# Patient Record
Sex: Female | Born: 1955
Health system: Southern US, Community
[De-identification: ages and names within clinical notes are randomized; demographics above are authoritative.]

## PROBLEM LIST (undated history)

## (undated) DIAGNOSIS — R011 Cardiac murmur, unspecified: Secondary | ICD-10-CM

## (undated) DIAGNOSIS — F32A Depression, unspecified: Secondary | ICD-10-CM

## (undated) DIAGNOSIS — T7840XA Allergy, unspecified, initial encounter: Secondary | ICD-10-CM

## (undated) DIAGNOSIS — M5030 Other cervical disc degeneration, unspecified cervical region: Secondary | ICD-10-CM

## (undated) DIAGNOSIS — M797 Fibromyalgia: Secondary | ICD-10-CM

## (undated) DIAGNOSIS — M51369 Other intervertebral disc degeneration, lumbar region without mention of lumbar back pain or lower extremity pain: Secondary | ICD-10-CM

## (undated) DIAGNOSIS — B029 Zoster without complications: Secondary | ICD-10-CM

## (undated) DIAGNOSIS — M5136 Other intervertebral disc degeneration, lumbar region: Secondary | ICD-10-CM

## (undated) DIAGNOSIS — Z8709 Personal history of other diseases of the respiratory system: Secondary | ICD-10-CM

## (undated) DIAGNOSIS — K648 Other hemorrhoids: Secondary | ICD-10-CM

## (undated) DIAGNOSIS — I2510 Atherosclerotic heart disease of native coronary artery without angina pectoris: Secondary | ICD-10-CM

## (undated) DIAGNOSIS — F329 Major depressive disorder, single episode, unspecified: Secondary | ICD-10-CM

## (undated) DIAGNOSIS — K219 Gastro-esophageal reflux disease without esophagitis: Secondary | ICD-10-CM

## (undated) DIAGNOSIS — E785 Hyperlipidemia, unspecified: Secondary | ICD-10-CM

## (undated) DIAGNOSIS — I341 Nonrheumatic mitral (valve) prolapse: Secondary | ICD-10-CM

## (undated) DIAGNOSIS — G4700 Insomnia, unspecified: Secondary | ICD-10-CM

## (undated) DIAGNOSIS — J302 Other seasonal allergic rhinitis: Secondary | ICD-10-CM

## (undated) DIAGNOSIS — D649 Anemia, unspecified: Secondary | ICD-10-CM

## (undated) DIAGNOSIS — G43909 Migraine, unspecified, not intractable, without status migrainosus: Secondary | ICD-10-CM

## (undated) DIAGNOSIS — E039 Hypothyroidism, unspecified: Secondary | ICD-10-CM

## (undated) DIAGNOSIS — Z86010 Personal history of colonic polyps: Secondary | ICD-10-CM

## (undated) DIAGNOSIS — K644 Residual hemorrhoidal skin tags: Secondary | ICD-10-CM

## (undated) DIAGNOSIS — I10 Essential (primary) hypertension: Secondary | ICD-10-CM

## (undated) DIAGNOSIS — M8580 Other specified disorders of bone density and structure, unspecified site: Secondary | ICD-10-CM

## (undated) HISTORY — DX: Anemia, unspecified: D64.9

## (undated) HISTORY — DX: Depression, unspecified: F32.A

## (undated) HISTORY — DX: Insomnia, unspecified: G47.00

## (undated) HISTORY — DX: Hyperlipidemia, unspecified: E78.5

## (undated) HISTORY — DX: Other specified disorders of bone density and structure, unspecified site: M85.80

## (undated) HISTORY — DX: Other intervertebral disc degeneration, lumbar region without mention of lumbar back pain or lower extremity pain: M51.369

## (undated) HISTORY — DX: Other intervertebral disc degeneration, lumbar region: M51.36

## (undated) HISTORY — DX: Personal history of other diseases of the respiratory system: Z87.09

## (undated) HISTORY — DX: Migraine, unspecified, not intractable, without status migrainosus: G43.909

## (undated) HISTORY — DX: Fibromyalgia: M79.7

## (undated) HISTORY — DX: Atherosclerotic heart disease of native coronary artery without angina pectoris: I25.10

## (undated) HISTORY — DX: Residual hemorrhoidal skin tags: K64.4

## (undated) HISTORY — DX: Nonrheumatic mitral (valve) prolapse: I34.1

## (undated) HISTORY — PX: TONSILLECTOMY: SUR1361

## (undated) HISTORY — DX: Zoster without complications: B02.9

## (undated) HISTORY — PX: POLYPECTOMY: SHX149

## (undated) HISTORY — DX: Allergy, unspecified, initial encounter: T78.40XA

## (undated) HISTORY — DX: Other cervical disc degeneration, unspecified cervical region: M50.30

## (undated) HISTORY — DX: Cardiac murmur, unspecified: R01.1

## (undated) HISTORY — DX: Other seasonal allergic rhinitis: J30.2

## (undated) HISTORY — DX: Personal history of colonic polyps: Z86.010

## (undated) HISTORY — DX: Hypothyroidism, unspecified: E03.9

## (undated) HISTORY — DX: Major depressive disorder, single episode, unspecified: F32.9

## (undated) HISTORY — PX: BUNIONECTOMY: SHX129

## (undated) HISTORY — DX: Residual hemorrhoidal skin tags: K64.8

## (undated) HISTORY — DX: Gastro-esophageal reflux disease without esophagitis: K21.9

---

## 1994-06-22 HISTORY — PX: SPLENECTOMY, TOTAL: SHX788

## 1994-06-22 HISTORY — PX: DEEP NECK LYMPH NODE BIOPSY / EXCISION: SUR126

## 1998-05-28 ENCOUNTER — Other Ambulatory Visit: Admission: RE | Admit: 1998-05-28 | Discharge: 1998-05-28 | Payer: Self-pay | Admitting: Obstetrics & Gynecology

## 1999-09-30 ENCOUNTER — Other Ambulatory Visit: Admission: RE | Admit: 1999-09-30 | Discharge: 1999-09-30 | Payer: Self-pay | Admitting: Obstetrics & Gynecology

## 2000-08-27 ENCOUNTER — Ambulatory Visit (HOSPITAL_COMMUNITY): Admission: RE | Admit: 2000-08-27 | Discharge: 2000-08-27 | Payer: Self-pay | Admitting: Internal Medicine

## 2000-12-08 ENCOUNTER — Other Ambulatory Visit: Admission: RE | Admit: 2000-12-08 | Discharge: 2000-12-08 | Payer: Self-pay | Admitting: Obstetrics & Gynecology

## 2001-03-22 ENCOUNTER — Encounter (INDEPENDENT_AMBULATORY_CARE_PROVIDER_SITE_OTHER): Payer: Self-pay | Admitting: *Deleted

## 2001-03-22 ENCOUNTER — Ambulatory Visit (HOSPITAL_BASED_OUTPATIENT_CLINIC_OR_DEPARTMENT_OTHER): Admission: RE | Admit: 2001-03-22 | Discharge: 2001-03-22 | Payer: Self-pay | Admitting: Plastic Surgery

## 2002-01-02 ENCOUNTER — Other Ambulatory Visit: Admission: RE | Admit: 2002-01-02 | Discharge: 2002-01-02 | Payer: Self-pay | Admitting: Obstetrics & Gynecology

## 2003-03-13 ENCOUNTER — Other Ambulatory Visit: Admission: RE | Admit: 2003-03-13 | Discharge: 2003-03-13 | Payer: Self-pay | Admitting: Obstetrics & Gynecology

## 2004-03-22 DIAGNOSIS — M797 Fibromyalgia: Secondary | ICD-10-CM

## 2004-03-22 HISTORY — DX: Fibromyalgia: M79.7

## 2004-05-09 ENCOUNTER — Other Ambulatory Visit: Admission: RE | Admit: 2004-05-09 | Discharge: 2004-05-09 | Payer: Self-pay | Admitting: Obstetrics & Gynecology

## 2005-03-16 ENCOUNTER — Encounter: Admission: RE | Admit: 2005-03-16 | Discharge: 2005-03-16 | Payer: Self-pay | Admitting: Neurology

## 2005-05-25 ENCOUNTER — Other Ambulatory Visit: Admission: RE | Admit: 2005-05-25 | Discharge: 2005-05-25 | Payer: Self-pay | Admitting: Obstetrics & Gynecology

## 2006-04-05 ENCOUNTER — Encounter: Admission: RE | Admit: 2006-04-05 | Discharge: 2006-04-05 | Payer: Self-pay | Admitting: Sports Medicine

## 2007-08-25 ENCOUNTER — Ambulatory Visit: Payer: Self-pay | Admitting: Internal Medicine

## 2007-09-02 ENCOUNTER — Ambulatory Visit: Payer: Self-pay | Admitting: Internal Medicine

## 2007-09-02 HISTORY — PX: COLONOSCOPY: SHX5424

## 2008-07-09 ENCOUNTER — Encounter: Admission: RE | Admit: 2008-07-09 | Discharge: 2008-07-09 | Payer: Self-pay | Admitting: Internal Medicine

## 2009-02-20 HISTORY — PX: STRABISMUS SURGERY: SHX218

## 2009-04-08 ENCOUNTER — Encounter: Payer: Self-pay | Admitting: Gastroenterology

## 2009-05-08 ENCOUNTER — Encounter: Payer: Self-pay | Admitting: Gastroenterology

## 2009-08-09 ENCOUNTER — Encounter: Payer: Self-pay | Admitting: Gastroenterology

## 2010-07-12 ENCOUNTER — Encounter: Payer: Self-pay | Admitting: Sports Medicine

## 2010-07-13 ENCOUNTER — Encounter: Payer: Self-pay | Admitting: Neurology

## 2010-07-24 NOTE — Letter (Signed)
Summary: [facility]  [facility]   Imported By: Sherian Rein 09/20/2009 08:29:20  _____________________________________________________________________  External Attachment:    Type:   Image     Comment:   External Document

## 2011-04-03 ENCOUNTER — Other Ambulatory Visit: Payer: Self-pay | Admitting: Obstetrics & Gynecology

## 2011-04-03 DIAGNOSIS — R928 Other abnormal and inconclusive findings on diagnostic imaging of breast: Secondary | ICD-10-CM

## 2011-04-21 ENCOUNTER — Ambulatory Visit
Admission: RE | Admit: 2011-04-21 | Discharge: 2011-04-21 | Disposition: A | Discharge status: Home / Self Care | Payer: BC Managed Care – PPO | Source: Ambulatory Visit | Attending: Obstetrics & Gynecology | Admitting: Obstetrics & Gynecology

## 2011-04-21 DIAGNOSIS — R928 Other abnormal and inconclusive findings on diagnostic imaging of breast: Secondary | ICD-10-CM

## 2011-04-22 ENCOUNTER — Telehealth: Payer: Self-pay | Admitting: Internal Medicine

## 2011-04-22 NOTE — Telephone Encounter (Signed)
Left message for patient to call back  

## 2011-04-22 NOTE — Telephone Encounter (Signed)
I have scheduled the patient for 05/18/11 2:00.  I have placed her on the cancellation list.  Dr Jarome Matin has told her she has IBS.  She is asked to follow up with him until the appt and she is asked to call periodically for openings.

## 2011-04-23 ENCOUNTER — Other Ambulatory Visit: Payer: Self-pay | Admitting: Obstetrics & Gynecology

## 2011-04-23 DIAGNOSIS — Z803 Family history of malignant neoplasm of breast: Secondary | ICD-10-CM

## 2011-05-20 ENCOUNTER — Ambulatory Visit: Payer: BC Managed Care – PPO | Admitting: Internal Medicine

## 2011-05-22 ENCOUNTER — Encounter: Payer: Self-pay | Admitting: Internal Medicine

## 2011-05-22 ENCOUNTER — Encounter: Payer: Self-pay | Admitting: Gastroenterology

## 2011-05-25 ENCOUNTER — Other Ambulatory Visit: Payer: BC Managed Care – PPO

## 2011-06-01 ENCOUNTER — Ambulatory Visit
Admission: RE | Admit: 2011-06-01 | Discharge: 2011-06-01 | Disposition: A | Discharge status: Home / Self Care | Payer: BC Managed Care – PPO | Source: Ambulatory Visit | Attending: Obstetrics & Gynecology | Admitting: Obstetrics & Gynecology

## 2011-06-01 DIAGNOSIS — Z803 Family history of malignant neoplasm of breast: Secondary | ICD-10-CM

## 2011-06-01 MED ORDER — GADOBENATE DIMEGLUMINE 529 MG/ML IV SOLN
14.0000 mL | Freq: Once | INTRAVENOUS | Status: AC | PRN
  Administered 2011-06-01: 14 mL via INTRAVENOUS

## 2011-06-10 ENCOUNTER — Ambulatory Visit (INDEPENDENT_AMBULATORY_CARE_PROVIDER_SITE_OTHER): Payer: BC Managed Care – PPO | Admitting: Internal Medicine

## 2011-06-10 ENCOUNTER — Encounter: Payer: Self-pay | Admitting: Internal Medicine

## 2011-06-10 DIAGNOSIS — K589 Irritable bowel syndrome without diarrhea: Secondary | ICD-10-CM | POA: Insufficient documentation

## 2011-06-10 DIAGNOSIS — R1319 Other dysphagia: Secondary | ICD-10-CM

## 2011-06-10 DIAGNOSIS — R198 Other specified symptoms and signs involving the digestive system and abdomen: Secondary | ICD-10-CM

## 2011-06-10 MED ORDER — LINACLOTIDE 290 MCG PO CAPS: 1.0000 | ORAL_CAPSULE | ORAL | Status: DC

## 2011-06-10 MED ORDER — PEG-KCL-NACL-NASULF-NA ASC-C 100 G PO SOLR: 1.0000 | Freq: Once | ORAL | Status: DC

## 2011-06-10 NOTE — Progress Notes (Signed)
Subjective:    Patient ID: Cynthia Poole, female    DOB: 1956-04-25, 55 y.o.   MRN: [id number]  HPI  This 55 year old married white woman presents for evaluation of abdominal pain and bowel habit changes. She relates that she's been constipated for many years. She attributes this to using amitriptyline for 20 years or more for chronic headaches. Unfortunately it doesn't seem to help her headaches completely, either. She began using MiraLax about a year ago for constipation. Prior to that she would move her bowels about twice a week. In general she did not have much discomfort or pain with that but over time she developed painful lower abdomen cramps. There was no clear-cut pattern to those. Increased over time. Now that she is on MiraLax she will go for a couple of days or so without defecation, than having normal stool followed by multiple more progressively looser stools that involve a lot of cramping and discomfort. The following day she feels worn out and sore. She will have some anorectal discomfort as well. He takes the MiraLax most days of the week. Stools are not necessarily more narrow. There is no bleeding reported. She clearly knows that if she has a stressful day planned at work such as a meeting with the staff or other issues she will feel worse. She denies any associated urinary symptoms, there is no urinary or fecal incontinence. She questions that she might have food allergies and contributing to headaches and cut issues. She does note she drink several glasses of red She'll get headaches and also had nausea and gastrointestinal upset.  For about 5 years she's had intermittent solid food dysphagia. She has not noticed significant heartburn problems. The pattern of intermittent intact solid food dysphagia has been stable for several years she says. There is no associated weight loss or bleeding.Marland Kitchen No Known Allergies Outpatient Prescriptions Prior to Visit  Medication Sig Dispense  Refill  . ALPRAZolam (XANAX) 0.5 MG tablet Take 0.5 mg by mouth daily as needed.        Marland Kitchen amitriptyline (ELAVIL) 25 MG tablet Take 25 mg by mouth daily.        Marland Kitchen aspirin 325 MG tablet Take 325 mg by mouth daily.        . Cholecalciferol (VITAMIN D) 2000 UNITS CAPS Take 1 capsule by mouth daily.        Marland Kitchen estradiol (VIVELLE-DOT) 0.05 MG/24HR Place 1 patch onto the skin once a week.        . levothyroxine (SYNTHROID, LEVOTHROID) 25 MCG tablet Take 1 tablet daily on an empty stomach       . gabapentin (NEURONTIN) 100 MG capsule Take 1-3 capsules by mouth every night        Past Medical History  Diagnosis Date  . MVP (mitral valve prolapse)   . Heart murmur   . History of pleurisy   . Seasonal allergies   . Vitamin D deficiency   . Osteopenia   . Internal and external bleeding hemorrhoids   . Depression   . Hypothyroidism   . HLD (hyperlipidemia)   . Migraines   . GERD (gastroesophageal reflux disease)   . Fibromyalgia 03/2004  . DDD (degenerative disc disease), cervical   . DDD (degenerative disc disease), lumbar   . Hodgkin's disease   . Insomnia   . Anemia    Past Surgical History  Procedure Date  . Splenectomy, total 1996  . Tonsillectomy 1965  . Strabismus surgery 02/2009  left  . Colonoscopy 09/02/2007    redundant colon, external hemorrhoids  . Bunionectomy   . Neck surgery 1996    right LN excision   History   Social History  . Marital Status: Married         Number of Children: 2  .     Social History Main Topics  . Smoking status: Never Smoker   . Smokeless tobacco: Never Used  . Alcohol Use: No  . Drug Use: No  .     Other Topics Concern  .  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 Heart disease Maternal Grandfather   . Heart disease Paternal Grandfather   . Heart disease      uncle  .  Aneurysm Father     AAA  . Hypertension Father   . Tuberculosis Father   . Breast cancer Maternal Grandmother   . Breast cancer Maternal Aunt   . Arthritis Mother   . Colon cancer Neg Hx   . Breast cancer Sister     x 2         Review of Systems Positive for some back pain, worsening vision, heart murmur all other systems are negative or as reflected above.    Objective:   Physical Exam General:  Well-developed, well-nourished and in no acute distress Eyes:  anicteric. ENT:   Mouth and posterior pharynx free of lesions.  Neck:   supple w/o thyromegaly or mass.  Lungs: Clear to auscultation bilaterally. Heart:  S1S2, no rubs, murmurs, gallops. Abdomen:  soft, non-tender, no hepatosplenomegaly, hernia, or mass and BS+.  Rectal: With female staff present, inspection of the anoderm shows a few skin tags. Otherwise normal. Anal way and is absent. There is    normal resting tone, scanty stool, no mass. There is a slight rectocele. Voluntary squeeze is intact though on the first attempt there    was initial slight relaxation and then squeezing of the anal sphincter. Valsalva maneuver shows appropriate descent and abdominal    contraction. Lymph:  no cervical or supraclavicular adenopathy. Extremities:   no edema Neuro:  A&O x 3.  Psych:  appropriate mood and  Affect.   Data Reviewed: 04/10/2011 labs. Hemoglobin 11.8 MCV 95. Normal comprehensive metabolic panel. B12 leveI is 956, ferritin 35, S. Pepin immunoglobulins normal October 31. TSH was normal as was free T4 October 19       Assessment & Plan:   #1change in bowel habits: She has chronic constipation and IBS issues I think. Her constipation problems sound like a slow transit issue. It may or may not be related to nortriptyline. She claims a high fiber diet though perhaps not that high. In my experience fiber does not tend to help patients like this very much. However her colonoscopy in 2009 had a fair prep overall. The  possibility of the development of a colorectal neoplastic lesion exists that was low. I have recommended and she has agreed to have a colonoscopy. Risks benefits and indications were explained she understands and agrees to proceed. In the meantime we have tried linaclotide, a new agent to treat constipation that increases fluid content in the intestine. Is also indicated for IBS associated with constipation. Samples are provided. She may still use MiraLax with this, I told her to titrate for effect. She  may end up needing some stress reduction techniques.  #2 dysphagia: This sounds like it could be peptic stricture or ring. She has had radiation for Hodgkin's disease so there could be some relationship to that though probably not it remains possible. Plan for an upper endoscopy with possible esophageal dilation. I'm not convinced she needs a PPI at this point, there is not much heartburn described. Await the EGD results. I've explained the risks benefits and indications she understands and agrees to proceed.  I appreciate the opportunity care of this patient.

## 2011-06-10 NOTE — Patient Instructions (Signed)
You have been scheduled for an Endoscopy/Colonoscopy with separate instructions given. Your prep kit has been sent to your pharmacy for you to pick up. You have been given samples of Linzess to try, take 1 by mouth every morning.

## 2011-06-11 ENCOUNTER — Encounter: Payer: Self-pay | Admitting: Internal Medicine

## 2011-06-14 ENCOUNTER — Observation Stay (HOSPITAL_COMMUNITY)
Admission: EM | Admit: 2011-06-14 | Discharge: 2011-06-15 | Disposition: A | Discharge status: Home / Self Care | Payer: BC Managed Care – PPO | Attending: Internal Medicine | Admitting: Internal Medicine

## 2011-06-14 ENCOUNTER — Encounter (HOSPITAL_COMMUNITY): Payer: Self-pay | Admitting: Emergency Medicine

## 2011-06-14 DIAGNOSIS — K5900 Constipation, unspecified: Secondary | ICD-10-CM | POA: Insufficient documentation

## 2011-06-14 DIAGNOSIS — R112 Nausea with vomiting, unspecified: Secondary | ICD-10-CM | POA: Insufficient documentation

## 2011-06-14 DIAGNOSIS — E876 Hypokalemia: Secondary | ICD-10-CM | POA: Insufficient documentation

## 2011-06-14 DIAGNOSIS — E860 Dehydration: Secondary | ICD-10-CM | POA: Insufficient documentation

## 2011-06-14 DIAGNOSIS — K5289 Other specified noninfective gastroenteritis and colitis: Principal | ICD-10-CM | POA: Insufficient documentation

## 2011-06-14 DIAGNOSIS — IMO0001 Reserved for inherently not codable concepts without codable children: Secondary | ICD-10-CM | POA: Insufficient documentation

## 2011-06-14 DIAGNOSIS — M545 Low back pain, unspecified: Secondary | ICD-10-CM | POA: Insufficient documentation

## 2011-06-14 DIAGNOSIS — Z87898 Personal history of other specified conditions: Secondary | ICD-10-CM | POA: Insufficient documentation

## 2011-06-14 DIAGNOSIS — R197 Diarrhea, unspecified: Secondary | ICD-10-CM | POA: Diagnosis present

## 2011-06-14 DIAGNOSIS — Z9081 Acquired absence of spleen: Secondary | ICD-10-CM

## 2011-06-14 DIAGNOSIS — Z9089 Acquired absence of other organs: Secondary | ICD-10-CM | POA: Insufficient documentation

## 2011-06-14 DIAGNOSIS — Z79899 Other long term (current) drug therapy: Secondary | ICD-10-CM | POA: Insufficient documentation

## 2011-06-14 DIAGNOSIS — D6489 Other specified anemias: Secondary | ICD-10-CM | POA: Insufficient documentation

## 2011-06-14 DIAGNOSIS — C8590 Non-Hodgkin lymphoma, unspecified, unspecified site: Secondary | ICD-10-CM | POA: Insufficient documentation

## 2011-06-14 DIAGNOSIS — R509 Fever, unspecified: Secondary | ICD-10-CM | POA: Diagnosis present

## 2011-06-14 DIAGNOSIS — I959 Hypotension, unspecified: Secondary | ICD-10-CM | POA: Diagnosis present

## 2011-06-14 DIAGNOSIS — R1110 Vomiting, unspecified: Secondary | ICD-10-CM

## 2011-06-14 DIAGNOSIS — I341 Nonrheumatic mitral (valve) prolapse: Secondary | ICD-10-CM | POA: Insufficient documentation

## 2011-06-14 DIAGNOSIS — E039 Hypothyroidism, unspecified: Secondary | ICD-10-CM | POA: Insufficient documentation

## 2011-06-14 DIAGNOSIS — M797 Fibromyalgia: Secondary | ICD-10-CM | POA: Insufficient documentation

## 2011-06-14 DIAGNOSIS — C859A Non-Hodgkin lymphoma, unspecified, in remission: Secondary | ICD-10-CM | POA: Insufficient documentation

## 2011-06-14 LAB — URINALYSIS, ROUTINE W REFLEX MICROSCOPIC
Glucose, UA: NEGATIVE mg/dL
Leukocytes, UA: NEGATIVE
Nitrite: NEGATIVE
Protein, ur: NEGATIVE mg/dL
Urobilinogen, UA: 0.2 mg/dL (ref 0.0–1.0)

## 2011-06-14 LAB — INFLUENZA PANEL BY PCR (TYPE A & B)
H1N1 flu by pcr: NOT DETECTED
Influenza A By PCR: NEGATIVE
Influenza B By PCR: NEGATIVE

## 2011-06-14 LAB — DIFFERENTIAL
Basophils Absolute: 0 10*3/uL (ref 0.0–0.1)
Eosinophils Absolute: 0 10*3/uL (ref 0.0–0.7)
Lymphocytes Relative: 7 % — ABNORMAL LOW (ref 12–46)
Lymphs Abs: 0.7 10*3/uL (ref 0.7–4.0)
Neutrophils Relative %: 86 % — ABNORMAL HIGH (ref 43–77)

## 2011-06-14 LAB — CBC
MCH: 30.7 pg (ref 26.0–34.0)
Platelets: 287 10*3/uL (ref 150–400)
RBC: 4.17 MIL/uL (ref 3.87–5.11)
WBC: 10.5 10*3/uL (ref 4.0–10.5)

## 2011-06-14 LAB — BASIC METABOLIC PANEL
CO2: 26 mEq/L (ref 19–32)
Calcium: 9 mg/dL (ref 8.4–10.5)
GFR calc Af Amer: >90 mL/min (ref >90)
GFR calc non Af Amer: >90 mL/min (ref >90)
Sodium: 137 mEq/L (ref 135–145)

## 2011-06-14 MED ORDER — ALUM & MAG HYDROXIDE-SIMETH 200-200-20 MG/5ML PO SUSP: 30.0000 mL | Freq: Four times a day (QID) | ORAL | Status: DC | PRN

## 2011-06-14 MED ORDER — OSELTAMIVIR PHOSPHATE 75 MG PO CAPS
75.0000 mg | ORAL_CAPSULE | Freq: Two times a day (BID) | ORAL | Status: DC
  Filled 2011-06-14 (×4): qty 1

## 2011-06-14 MED ORDER — ESTRADIOL 0.1 MG/24HR TD PTWK: 0.1000 mg | MEDICATED_PATCH | TRANSDERMAL | Status: DC

## 2011-06-14 MED ORDER — ACETAMINOPHEN 650 MG RE SUPP
650.0000 mg | Freq: Four times a day (QID) | RECTAL | Status: DC | PRN
  Administered 2011-06-14: 650 mg via RECTAL
  Filled 2011-06-14: qty 1

## 2011-06-14 MED ORDER — VITAMINS A & D EX OINT
TOPICAL_OINTMENT | CUTANEOUS | Status: AC
  Filled 2011-06-14: qty 5

## 2011-06-14 MED ORDER — HYDROMORPHONE HCL PF 1 MG/ML IJ SOLN
1.0000 mg | Freq: Once | INTRAMUSCULAR | Status: AC
  Administered 2011-06-14: 1 mg via INTRAVENOUS
  Filled 2011-06-14: qty 1

## 2011-06-14 MED ORDER — ZOLPIDEM TARTRATE 10 MG PO TABS
10.0000 mg | ORAL_TABLET | Freq: Every evening | ORAL | Status: DC | PRN
  Filled 2011-06-14: qty 1

## 2011-06-14 MED ORDER — ONDANSETRON HCL 4 MG/2ML IJ SOLN
4.0000 mg | Freq: Once | INTRAMUSCULAR | Status: AC
  Administered 2011-06-14: 4 mg via INTRAVENOUS
  Filled 2011-06-14: qty 2

## 2011-06-14 MED ORDER — AMITRIPTYLINE HCL 25 MG PO TABS
25.0000 mg | ORAL_TABLET | Freq: Every day | ORAL | Status: DC
Start: 2011-06-14 — End: 2011-06-15
  Administered 2011-06-14: 25 mg via ORAL
  Filled 2011-06-14 (×2): qty 1

## 2011-06-14 MED ORDER — LEVOTHYROXINE SODIUM 100 MCG PO TABS
100.0000 ug | ORAL_TABLET | Freq: Every day | ORAL | Status: DC
  Administered 2011-06-15: 100 ug via ORAL
  Filled 2011-06-14: qty 1

## 2011-06-14 MED ORDER — SODIUM CHLORIDE 0.9 % IV BOLUS (SEPSIS)
1000.0000 mL | Freq: Once | INTRAVENOUS | Status: AC
  Administered 2011-06-14: 1000 mL via INTRAVENOUS

## 2011-06-14 MED ORDER — ACETAMINOPHEN 325 MG PO TABS
ORAL_TABLET | ORAL | Status: AC
  Administered 2011-06-14: 650 mg
  Filled 2011-06-14: qty 2

## 2011-06-14 MED ORDER — ONDANSETRON HCL 4 MG PO TABS: 4.0000 mg | ORAL_TABLET | Freq: Three times a day (TID) | ORAL | Status: DC | PRN

## 2011-06-14 MED ORDER — ENOXAPARIN SODIUM 40 MG/0.4ML ~~LOC~~ SOLN
40.0000 mg | SUBCUTANEOUS | Status: DC
  Filled 2011-06-14 (×2): qty 0.4

## 2011-06-14 MED ORDER — ALPRAZOLAM 0.5 MG PO TABS
0.5000 mg | ORAL_TABLET | Freq: Every day | ORAL | Status: DC | PRN
  Administered 2011-06-14: 0.5 mg via ORAL
  Filled 2011-06-14: qty 1

## 2011-06-14 MED ORDER — SODIUM CHLORIDE 0.9 % IV SOLN
INTRAVENOUS | Status: DC
  Administered 2011-06-14: 13:00:00 via INTRAVENOUS

## 2011-06-14 MED ORDER — ACETAMINOPHEN 325 MG PO TABS
650.0000 mg | ORAL_TABLET | Freq: Four times a day (QID) | ORAL | Status: DC | PRN
  Filled 2011-06-14: qty 1

## 2011-06-14 MED ORDER — POTASSIUM CHLORIDE IN NACL 20-0.9 MEQ/L-% IV SOLN
INTRAVENOUS | Status: DC
  Administered 2011-06-14 – 2011-06-15 (×4): via INTRAVENOUS
  Filled 2011-06-14 (×8): qty 1000

## 2011-06-14 NOTE — ED Notes (Signed)
Pt states she believe that she ate bad food last night. She has experienced N/V/D x 25 in that past day. Patient temp 101.8 and states that she doesn't want tylenol PO at this time because she is afraid she will vomit. Bowel sounds present in all 4 quads.

## 2011-06-14 NOTE — H&P (Signed)
Steven Veazie is an 55 y.o. female.   Chief Complaint: severe nausea and diarrhea, weakness HPI:  The patient is a 55 year old woman who is well-known to me. She was in her usual state of good health until about midnight last night when she awoke with nausea and vomiting. She had multiple episodes of nausea, vomiting, and diarrhea, and today has had some fever. She was extremely lightheaded today and called 911 for ambulance transfer to the emergency room. In the emergency room she had severe hypotension and has been started on high-dose IV fluids. Currently she has sinus congestion with fever sore throat, and mild nausea after she was given an antiemetic in the emergency room. Yesterday she had chicken pot pie that was from a frozen dinner. There are several close family members with similar symptoms. She recently was treated with Zithromax for a presumed sinus infection. Of note, her history is most significant for remote Hodgkin's lymphoma which led to splenectomy. She has not had recurrence of the Hodgkin's disease.  Past Medical History  Diagnosis Date  . MVP (mitral valve prolapse)   . Heart murmur   . History of pleurisy   . Seasonal allergies   . Vitamin D deficiency   . Osteopenia   . Internal and external bleeding hemorrhoids   . Depression   . Hypothyroidism   . HLD (hyperlipidemia)   . Migraines   . GERD (gastroesophageal reflux disease)   . Fibromyalgia 03/2004  . DDD (degenerative disc disease), cervical   . DDD (degenerative disc disease), lumbar   . Hodgkin's disease   . Insomnia   . Anemia     Medications Prior to Admission  Medication Dose Route Frequency Provider Last Rate Last Dose  . 0.9 %  sodium chloride infusion   Intravenous Continuous Leroy Sea, MD      . acetaminophen (TYLENOL) 325 MG tablet        650 mg at 06/14/11 0937  . HYDROmorphone (DILAUDID) injection 1 mg  1 mg Intravenous Once Celene Kras, MD   1 mg at 06/14/11 0981  . ondansetron  (ZOFRAN) injection 4 mg  4 mg Intravenous Once Celene Kras, MD   4 mg at 06/14/11 0830  . sodium chloride 0.9 % bolus 1,000 mL  1,000 mL Intravenous Once Celene Kras, MD   1,000 mL at 06/14/11 0834  . sodium chloride 0.9 % bolus 1,000 mL  1,000 mL Intravenous Once Celene Kras, MD   1,000 mL at 06/14/11 1021  . sodium chloride 0.9 % bolus 1,000 mL  1,000 mL Intravenous Once Celene Kras, MD   1,000 mL at 06/14/11 1111   Medications Prior to Admission  Medication Sig Dispense Refill  . ALPRAZolam (XANAX) 0.5 MG tablet Take 0.5 mg by mouth daily as needed. For anxiety.      Marland Kitchen amitriptyline (ELAVIL) 25 MG tablet Take 25 mg by mouth at bedtime.       Marland Kitchen aspirin 325 MG tablet Take 325 mg by mouth daily.        . Cholecalciferol (VITAMIN D) 2000 UNITS CAPS Take 1 capsule by mouth daily.        Marland Kitchen estradiol (VIVELLE-DOT) 0.05 MG/24HR Place 1 patch onto the skin 2 (two) times a week.       . levothyroxine (SYNTHROID, LEVOTHROID) 25 MCG tablet Take 1 tablet daily on an empty stomach       . Polyethylene Glycol 3350 (MIRALAX PO) Take 1  scoop by mouth at bedtime as needed. For constipation.      . Linaclotide 290 MCG CAPS Take 1 tablet by mouth every morning.  12 capsule  0  . peg 3350 powder (MOVIPREP) 100 G SOLR Take 1 kit (100 g total) by mouth once.  1 kit  0    ADDITIONAL HOME MEDICATIONS: No additional home medications to add to her medication reconciliation last  PHYSICIANS INVOLVED IN CARE: Jarome Matin (primary care), [facility]  Past Surgical History  Procedure Date  . Splenectomy, total 1996  . Tonsillectomy 1965  . Strabismus surgery 02/2009    left  . Colonoscopy 09/02/2007    redundant colon, external hemorrhoids  . Bunionectomy   . Neck surgery 1996    right LN excision    Family History  Problem Relation Age of Onset  . Heart disease Maternal Grandfather   . Heart disease Paternal Grandfather   . Heart disease      uncle  . Aneurysm Father     AAA  . Hypertension Father     . Tuberculosis Father   . Breast cancer Maternal Grandmother   . Breast cancer Maternal Aunt   . Arthritis Mother   . Colon cancer Neg Hx   . Breast cancer Sister     x 2      Social History:  reports that she has never smoked. She has never used smokeless tobacco. She reports that she does not drink alcohol or use illicit drugs. she is married and lives with her husband in [place]. She is a nonsmoker and does not abuse alcohol.  Allergies: No Known Allergies   ROS: ankle swelling, cancer/tumor and heart murmur  PHYSICAL EXAM: Blood pressure 89/51, pulse 91, temperature 101.8 F (38.8 C), temperature source Oral, resp. rate 15, height 5\' 6"  (1.676 m), weight 67.132 kg (148 lb), SpO2 94.00%. In general, she is a well-nourished well-developed white woman who was mildly flushed while lying supine in bed. HEENT exam was significant for moderate rhinitis, neck was supple without jugular venous distention or carotid bruit. Chest was clear to auscultation, heart had a regular rate and rhythm, abdomen had normal bowel sounds and no tenderness, extremities were without cyanosis, clubbing, or edema. Neurologic exam was nonfocal.  Results for orders placed during the hospital encounter of 06/14/11 (from the past 48 hour(s))  CBC     Status: Normal   Collection Time   06/14/11  8:20 AM      Component Value Range Comment   WBC 10.5  4.0 - 10.5 (K/uL)    RBC 4.17  3.87 - 5.11 (MIL/uL)    Hemoglobin 12.8  12.0 - 15.0 (g/dL)    HCT 16.1  09.6 - 04.5 (%)    MCV 89.7  78.0 - 100.0 (fL)    MCH 30.7  26.0 - 34.0 (pg)    MCHC 34.2  30.0 - 36.0 (g/dL)    RDW 40.9  81.1 - 91.4 (%)    Platelets 287  150 - 400 (K/uL)   DIFFERENTIAL     Status: Abnormal   Collection Time   06/14/11  8:20 AM      Component Value Range Comment   Neutrophils Relative 86 (*) 43 - 77 (%)    Neutro Abs 9.0 (*) 1.7 - 7.7 (K/uL)    Lymphocytes Relative 7 (*) 12 - 46 (%)    Lymphs Abs 0.7  0.7 - 4.0 (K/uL)     Monocytes Relative 7  3 -  12 (%)    Monocytes Absolute 0.7  0.1 - 1.0 (K/uL)    Eosinophils Relative 0  0 - 5 (%)    Eosinophils Absolute 0.0  0.0 - 0.7 (K/uL)    Basophils Relative 0  0 - 1 (%)    Basophils Absolute 0.0  0.0 - 0.1 (K/uL)   BASIC METABOLIC PANEL     Status: Abnormal   Collection Time   06/14/11  8:20 AM      Component Value Range Comment   Sodium 137  135 - 145 (mEq/L)    Potassium 3.2 (*) 3.5 - 5.1 (mEq/L)    Chloride 101  96 - 112 (mEq/L)    CO2 26  19 - 32 (mEq/L)    Glucose, Bld 106 (*) 70 - 99 (mg/dL)    BUN 20  6 - 23 (mg/dL)    Creatinine, Ser 1.61  0.50 - 1.10 (mg/dL)    Calcium 9.0  8.4 - 10.5 (mg/dL)    GFR calc non Af Amer >90  >90 (mL/min)    GFR calc Af Amer >90  >90 (mL/min)   URINALYSIS, ROUTINE W REFLEX MICROSCOPIC     Status: Normal   Collection Time   06/14/11 10:21 AM      Component Value Range Comment   Color, Urine YELLOW  YELLOW     APPearance CLEAR  CLEAR     Specific Gravity, Urine 1.020  1.005 - 1.030     pH 5.5  5.0 - 8.0     Glucose, UA NEGATIVE  NEGATIVE (mg/dL)    Hgb urine dipstick NEGATIVE  NEGATIVE     Bilirubin Urine NEGATIVE  NEGATIVE     Ketones, ur NEGATIVE  NEGATIVE (mg/dL)    Protein, ur NEGATIVE  NEGATIVE (mg/dL)    Urobilinogen, UA 0.2  0.0 - 1.0 (mg/dL)    Nitrite NEGATIVE  NEGATIVE     Leukocytes, UA NEGATIVE  NEGATIVE  MICROSCOPIC NOT DONE ON URINES WITH NEGATIVE PROTEIN, BLOOD, LEUKOCYTES, NITRITE, OR GLUCOSE <1000 mg/dL.   No results found.   Assessment/Plan #1 Nausea, Vomiting, and Diarrhea: Her symptoms are most likely from a viral etiology, either her manifestation of acute influenza or nor virus infection. An acute influenza rapid test has been sent as well as stool specimens for C. difficile and enteric pathogens. I will start her on empiric treatment for influenza given the current epidemic of influenza in our community. She is status post splenectomy and is immunocompromised, so she needs admission and  observation. We will recheck a CBC tomorrow morning as well as electrolytes. #2 Hypotension: She had severe hypotension with blood pressure systolics in the 60-70 range initially but is slowly improving with IV fluids. She will get high-dose IV fluids today.  Deem Marmol G 06/14/2011, 1:11 PM

## 2011-06-14 NOTE — ED Provider Notes (Signed)
History     CSN: [id number]  Arrival date & time 06/14/11  4098   First MD Initiated Contact with Patient 06/14/11 0725      Chief Complaint  Patient presents with  . Abdominal Pain  . Nausea  . Emesis  . Diarrhea    (Consider location/radiation/quality/duration/timing/severity/associated sxs/prior treatment) Patient is a 55 y.o. female presenting with abdominal pain, vomiting, and diarrhea.  Abdominal Pain The primary symptoms of the illness include abdominal pain, vomiting and diarrhea. The current episode started 6 to 12 hours ago (Pt had frozen chicken pot pie about 3-4 hours prior.  ). The onset of the illness was sudden.  Vomiting occurs more than 10 times per day.  The diarrhea occurs more than 10 times per day.  Emesis  Associated symptoms include abdominal pain and diarrhea.  Diarrhea The primary symptoms include abdominal pain, vomiting and diarrhea.  Pt states her family is ill as well.  She thinks it may be food poisoning.    Past Medical History  Diagnosis Date  . MVP (mitral valve prolapse)   . Heart murmur   . History of pleurisy   . Seasonal allergies   . Vitamin D deficiency   . Osteopenia   . Internal and external bleeding hemorrhoids   . Depression   . Hypothyroidism   . HLD (hyperlipidemia)   . Migraines   . GERD (gastroesophageal reflux disease)   . Fibromyalgia 03/2004  . DDD (degenerative disc disease), cervical   . DDD (degenerative disc disease), lumbar   . Hodgkin's disease   . Insomnia   . Anemia     Past Surgical History  Procedure Date  . Splenectomy, total 1996  . Tonsillectomy 1965  . Strabismus surgery 02/2009    left  . Colonoscopy 09/02/2007    redundant colon, external hemorrhoids  . Bunionectomy   . Neck surgery 1996    right LN excision    Family History  Problem Relation Age of Onset  . Heart disease Maternal Grandfather   . Heart disease Paternal Grandfather   . Heart disease      uncle  . Aneurysm Father    AAA  . Hypertension Father   . Tuberculosis Father   . Breast cancer Maternal Grandmother   . Breast cancer Maternal Aunt   . Arthritis Mother   . Colon cancer Neg Hx   . Breast cancer Sister     x 2     History  Substance Use Topics  . Smoking status: Never Smoker   . Smokeless tobacco: Never Used  . Alcohol Use: No    OB History    Grav Para Term Preterm Abortions TAB SAB Ect Mult Living                  Review of Systems  Gastrointestinal: Positive for vomiting, abdominal pain and diarrhea.  All other systems reviewed and are negative.    Allergies  Review of patient's allergies indicates no known allergies.  Home Medications   Current Outpatient Rx  Name Route Sig Dispense Refill  . ALPRAZOLAM 0.5 MG PO TABS Oral Take 0.5 mg by mouth daily as needed.      Marland Kitchen AMITRIPTYLINE HCL 25 MG PO TABS Oral Take 25 mg by mouth daily.      . ASPIRIN 325 MG PO TABS Oral Take 325 mg by mouth daily.      Marland Kitchen VITAMIN D 2000 UNITS PO CAPS Oral Take 1 capsule by mouth daily.      Marland Kitchen  ESTRADIOL 0.05 MG/24HR TD PTTW Transdermal Place 1 patch onto the skin once a week.      Marland Kitchen LEVOTHYROXINE SODIUM 25 MCG PO TABS  Take 1 tablet daily on an empty stomach     . LINACLOTIDE 290 MCG PO CAPS Oral Take 1 tablet by mouth every morning. 12 capsule 0    Samples given to patient    Lot#  [id number]          ...  . PEG-KCL-NACL-NASULF-NA ASC-C 100 G PO SOLR Oral Take 1 kit (100 g total) by mouth once. 1 kit 0  . MIRALAX PO Oral Take by mouth. Once daily at bedtime as needed      BP 112/47  Pulse 103  Temp(Src) 98.8 F (37.1 C) (Oral)  Resp 20  Ht 5\' 6"  (1.676 m)  Wt 148 lb (67.132 kg)  BMI 23.89 kg/m2  SpO2 100%  Physical Exam  Nursing note and vitals reviewed. Constitutional: She appears well-developed and well-nourished. No distress.  HENT:  Head: Normocephalic and atraumatic.  Right Ear: External ear normal.  Left Ear: External ear normal.  Mouth/Throat: Mucous membranes are dry. No  oropharyngeal exudate.  Eyes: Conjunctivae are normal. Right eye exhibits no discharge. Left eye exhibits no discharge. No scleral icterus.  Neck: Neck supple. No tracheal deviation present.  Cardiovascular: Normal rate, regular rhythm and intact distal pulses.   Pulmonary/Chest: Effort normal and breath sounds normal. No stridor. No respiratory distress. She has no wheezes. She has no rales.  Abdominal: Soft. Bowel sounds are normal. She exhibits no distension. There is tenderness (diffusely). There is no rebound and no guarding.  Musculoskeletal: She exhibits no edema and no tenderness.  Neurological: She is alert. She has normal strength. No sensory deficit. Cranial nerve deficit:  no gross defecits noted. She exhibits normal muscle tone. She displays no seizure activity. Coordination normal.  Skin: Skin is warm and dry. No rash noted.  Psychiatric: She has a normal mood and affect.    ED Course  Procedures (including critical care time)  Labs Reviewed  DIFFERENTIAL - Abnormal; Notable for the following:    Neutrophils Relative 86 (*)    Neutro Abs 9.0 (*)    Lymphocytes Relative 7 (*)    All other components within normal limits  BASIC METABOLIC PANEL - Abnormal; Notable for the following:    Potassium 3.2 (*)    Glucose, Bld 106 (*)    All other components within normal limits  CBC  URINALYSIS, ROUTINE W REFLEX MICROSCOPIC  STOOL CULTURE  CLOSTRIDIUM DIFFICILE CULTURE-FECAL  FECAL LACTOFERRIN   No results found.   1. Vomiting and diarrhea   2. Dehydration   3. Hypotension       MDM  Patient is feeling better after IV fluids. She has been able to tolerate oral fluids well. She has no focal tenderness at this time. Other family members are ill as well. I suspect a gastroenteritis type picture versus food poisoning. I doubt appendicitis acute colitis.   11:09 AM patient's blood pressure has decreased into the 80s. We'll continue IV fluid hydration however with this  hypotension fever and feel she should be admitted to the hospital for continued treatment and monitoring.     Celene Kras, MD 06/14/11 [id number]

## 2011-06-14 NOTE — ED Notes (Signed)
C/o flulike symptoms; nausea, vomiting and diarrhea started midnight

## 2011-06-14 NOTE — ED Notes (Signed)
Abdominal pain associated with nausea, vomiting and diarrhea since midnight, medicated with zofran 4 mg iv by ems pta

## 2011-06-15 LAB — COMPREHENSIVE METABOLIC PANEL
Albumin: 2.4 g/dL — ABNORMAL LOW (ref 3.5–5.2)
Alkaline Phosphatase: 27 U/L — ABNORMAL LOW (ref 39–117)
BUN: 8 mg/dL (ref 6–23)
Creatinine, Ser: 0.73 mg/dL (ref 0.50–1.10)
GFR calc Af Amer: >90 mL/min (ref >90)
Glucose, Bld: 84 mg/dL (ref 70–99)
Potassium: 4.4 mEq/L (ref 3.5–5.1)
Total Protein: 5 g/dL — ABNORMAL LOW (ref 6.0–8.3)

## 2011-06-15 LAB — CBC
HCT: 31.8 % — ABNORMAL LOW (ref 36.0–46.0)
MCV: 91.4 fL (ref 78.0–100.0)
Platelets: 266 10*3/uL (ref 150–400)
RBC: 3.48 MIL/uL — ABNORMAL LOW (ref 3.87–5.11)
RDW: 14.2 % (ref 11.5–15.5)
WBC: 5.4 10*3/uL (ref 4.0–10.5)

## 2011-06-15 MED ORDER — DIPHENHYDRAMINE HCL 25 MG PO CAPS
25.0000 mg | ORAL_CAPSULE | Freq: Once | ORAL | Status: AC
  Administered 2011-06-15: 25 mg via ORAL
  Filled 2011-06-15: qty 1

## 2011-06-15 NOTE — Progress Notes (Signed)
Patient with nasal congestion and periorbital edema. Pt. States that her face began to feel a little swollen after receiving zofran in ED. No respiratory distress noted. No further symptoms. Pt suggests using benedryl as she feels this may either be sinus related or and adverse reaction to zofran. Dr. Jacky Kindle notified and new orders received. Will monitor.

## 2011-06-15 NOTE — Discharge Summary (Signed)
DISCHARGE SUMMARY  Cynthia Poole  MR#: [id number]  DOB:1955-07-27  Date of Admission: 06/14/2011 Date of Discharge: 06/15/2011  Attending Physician:Ilamae Geng ALAN  Patient's ZHY:QMVHQION,GEXBMW G, MD, MD  Consults:  none  Discharge Diagnoses: Principal Problem:  *Fever, likely due to viral infection Presumed diagnosis is viral gastroenteritis with severe fluid deficits leading to hypotension Active Problems:  , resolved with hydration  Diarrhea, resolved without recurrence  S/P splenectomy History of Hodgkin's lymphoma, no evidence of recurrence Primary hypothyroidism on therapy Fibromyalgia, on therapy Chronic constipation Mild hypokalemia, resolved Delusional anemia Mild edema due to fluids Chronic anxiety on medicines   Discharge Medications: Current Discharge Medication List    CONTINUE these medications which have NOT CHANGED   Details  ALPRAZolam (XANAX) 0.5 MG tablet Take 0.5 mg by mouth daily as needed. For anxiety.    amitriptyline (ELAVIL) 25 MG tablet Take 25 mg by mouth at bedtime.     aspirin 325 MG tablet Take 325 mg by mouth daily.      Cholecalciferol (VITAMIN D) 2000 UNITS CAPS Take 1 capsule by mouth daily.      estradiol (VIVELLE-DOT) 0.05 MG/24HR Place 1 patch onto the skin 2 (two) times a week.     levothyroxine (SYNTHROID, LEVOTHROID) 25 MCG tablet Take 1 tablet daily on an empty stomach     Polyethylene Glycol 3350 (MIRALAX PO) Take 1 scoop by mouth at bedtime as needed. For constipation.    Linaclotide 290 MCG CAPS Take 1 tablet by mouth every morning. Qty: 12 capsule, Refills: 0    peg 3350 powder (MOVIPREP) 100 G SOLR Take 1 kit (100 g total) by mouth once. Qty: 1 kit, Refills: 0        Hospital Procedures: Mr Breast Bilateral W Wo Contrast  06/02/2011  *RADIOLOGY REPORT*  Clinical Data: Strong family history of breast cancer in two sisters, mother, grandmother, and an aunt.  BILATERAL BREAST MRI WITH AND WITHOUT  CONTRAST  Technique: Multiplanar, multisequence MR images of both breasts were obtained prior to and following the intravenous administration of 14ml of Multihance.  Three dimensional images were evaluated at the independent DynaCad workstation.  Comparison:  Prior mammograms  Findings: There is a moderate parenchymal type enhancement pattern. This may limit the sensitivity for detection of malignancy. Allowing for this, no dominant area of abnormal enhancement is seen in either breast.  No abnormal T2-weighted hyperintensity or lymphadenopathy.  IMPRESSION: No MRI evidence for malignancy in either breast.  Screening mammography is recommended October 2013 to maintain yearly screening schedule.  Consider biennial screening MRI due to strong family history of breast cancer.  THREE-DIMENSIONAL MR IMAGE RENDERING ON INDEPENDENT WORKSTATION:  Three-dimensional MR images were rendered by post-processing of the original MR data on an independent workstation.  The three- dimensional MR images were interpreted, and findings were reported in the accompanying complete MRI report for this study.  BI-RADS CATEGORY 1:  Negative.  Original Report Authenticated By: Harrel Lemon, M.D.    History of Present Illness: This is a 55 year old white female presented with nausea vomiting diarrhea in the setting of fever and hypotension.  Hospital Course: The patient was treated with aggressive fluid resuscitation and covered with Tamiflu. Influenza testing subsequently came back negative. The patient had no more diarrhea after arrival here. Her nausea and vomiting has resolved and she is now taking regular diet. She does note some facial and periorbital edema which I think is due to the large amount of fluid she received. Her stomach  is feeling good. She's had no diarrhea. She has no nausea and ate a normal breakfast. She knows no breathing trouble or chest pain. She has no generalized aches or other pains. She's had no fever  chills or sweats. Her electrolytes now look good. She is a bit dilutional anemia after the vigorous fluid resuscitation.  Day of Discharge Exam BP 105/68  Pulse 84  Temp(Src) 98 F (36.7 C) (Oral)  Resp 18  Ht 5\' 6"  (1.676 m)  Wt 73.573 kg (162 lb 3.2 oz)  BMI 26.18 kg/m2  SpO2 96%  Physical Exam: General appearance: alert, fairly healthy appearing white female sitting up in no distress face is symmetric with minimal periorbital edema. Sclerae anicteric. There is a bit of matting around her eyes but no evidence of active infection Neck is supple and no JVD is present Resp: clear to auscultation bilaterally Cardio: regular rate and rhythm GI: soft, non-tender; bowel sounds normal; no masses,  no organomegaly Extremities: no clubbing, cyanosis or edema Neuro: Patient's awake alert and mentating perfectly well, speech is clear ,no tremor is present Skin is warm and dry without rashes.  Discharge Labs:  [facility] 06/15/11 0448 06/14/11 0820  NA 137 137  K 4.4 3.2*  CL 109 101  CO2 22 26  GLUCOSE 84 106*  BUN 8 20  CREATININE 0.73 0.78  CALCIUM 7.3* 9.0  MG -- --  PHOS -- --    Basename 06/15/11 0448  AST 30  ALT 16  ALKPHOS 27*  BILITOT 0.2*  PROT 5.0*  ALBUMIN 2.4*    Basename 06/15/11 0448 06/14/11 0820  WBC 5.4 10.5  NEUTROABS -- 9.0*  HGB 10.7* 12.8  HCT 31.8* 37.4  MCV 91.4 89.7  PLT 266 287      Discharge instructions: Discharge Orders    Future Appointments: Provider: Department: Dept Phone: Center:   06/29/2011 2:30 PM Cynthia Boop, MD [facility] [phone] [facility]      Disposition: To home  Follow-up Appts: Follow-up with Dr. Eloise Harman at [facility] in 2 weeks  Call for appointment.  Condition on Discharge: Improved  Tests Needing Follow-up: None  Signed: Venisa Poole ALAN 06/15/2011, 10:12 AM

## 2011-06-29 ENCOUNTER — Encounter: Payer: Self-pay | Admitting: Internal Medicine

## 2011-06-29 ENCOUNTER — Ambulatory Visit (AMBULATORY_SURGERY_CENTER): Payer: BC Managed Care – PPO | Admitting: Internal Medicine

## 2011-06-29 DIAGNOSIS — D126 Benign neoplasm of colon, unspecified: Secondary | ICD-10-CM

## 2011-06-29 DIAGNOSIS — Z860100 Personal history of colon polyps, unspecified: Secondary | ICD-10-CM

## 2011-06-29 DIAGNOSIS — K5730 Diverticulosis of large intestine without perforation or abscess without bleeding: Secondary | ICD-10-CM

## 2011-06-29 DIAGNOSIS — K644 Residual hemorrhoidal skin tags: Secondary | ICD-10-CM

## 2011-06-29 DIAGNOSIS — R1319 Other dysphagia: Secondary | ICD-10-CM

## 2011-06-29 DIAGNOSIS — R198 Other specified symptoms and signs involving the digestive system and abdomen: Secondary | ICD-10-CM

## 2011-06-29 HISTORY — PX: ESOPHAGOGASTRODUODENOSCOPY: SHX1529

## 2011-06-29 HISTORY — DX: Personal history of colon polyps, unspecified: Z86.0100

## 2011-06-29 HISTORY — DX: Personal history of colonic polyps: Z86.010

## 2011-06-29 HISTORY — PX: COLONOSCOPY: SHX174

## 2011-06-29 MED ORDER — SODIUM CHLORIDE 0.9 % IV SOLN: 500.0000 mL | INTRAVENOUS | Status: DC

## 2011-06-29 NOTE — Patient Instructions (Addendum)
The esophagus looked normal as did the stomach and duodenum (beginning of intestine). Since you have been having swallowing problems I dilated the esophagus. Please follow the special post-dilation diet. The colonoscopy showed a small polyp (benign), diverticulosis and small hemorrhoids. Please use MiraLax to help the constipation. I will send a letter about the polyp results (pathology) and further plans. Iva Boop, MD, [facility]   Resume your prior medications today. Please call if any questions or concerns.

## 2011-06-29 NOTE — Progress Notes (Signed)
Pt states she has history of having hypotension with procedures. ewm

## 2011-06-29 NOTE — Op Note (Signed)
[facility] [address] [place], [place]  [address]  ENDOSCOPY PROCEDURE REPORT  PATIENT:  Cynthia Poole, Cynthia Poole  MR#:  [id number] BIRTHDATE:  1956-03-15, 55 yrs. old  GENDER:  female  ENDOSCOPIST:  Iva Boop, MD, [facility]  PROCEDURE DATE:  06/29/2011 PROCEDURE:  EGD, diagnostic 43235, Maloney Dilation of Esophagus ASA CLASS:  Class II INDICATIONS:  dysphagia  MEDICATIONS:   These medications were titrated to patient response per physician's verbal order, Fentanyl 50 mcg IV, Versed 6 mg IV TOPICAL ANESTHETIC:  Cetacaine Spray  DESCRIPTION OF PROCEDURE:   After the risks benefits and alternatives of the procedure were thoroughly explained, informed consent was obtained.  The LB GIF-H180 [id number] endoscope was introduced through the mouth and advanced to the second portion of the duodenum, without limitations.  The instrument was slowly withdrawn as the mucosa was fully examined. <<PROCEDUREIMAGES>>  The upper, middle, and distal third of the esophagus were carefully inspected and no abnormalities were noted. The z-line was well seen at the GEJ. The endoscope was pushed into the fundus which was normal including a retroflexed view. The antrum,gastric body, first and second part of the duodenum were unremarkable. Retroflexed views revealed no abnormalities.    The scope was then withdrawn from the patient, a 81 [place] maloney dilator passed, and the procedure completed.  COMPLICATIONS:  None  ENDOSCOPIC IMPRESSION: 1) Normal EGD RECOMMENDATIONS: post-dilation diet follow-up as needed if recurrent dysphagia  Iva Boop, MD, [facility]  CC:  Jarome Matin, MD and The Patient  n. eSIGNED:   Iva Boop at 06/29/2011 03:30 PM  Temple Pacini, [phone]

## 2011-06-29 NOTE — Progress Notes (Signed)
No complaints noted in the recovery room. Maw  Patient did not experience any of the following events: a burn prior to discharge; a fall within the facility; wrong site/side/patient/procedure/implant event; or a hospital transfer or hospital admission upon discharge from the facility. (G8907) Patient did not have preoperative order for IV antibiotic SSI prophylaxis. (G8918)  

## 2011-06-29 NOTE — Op Note (Signed)
[facility] [address] [place], [place]  [address]  COLONOSCOPY PROCEDURE REPORT  PATIENT:  Cynthia Poole, Cynthia Poole  MR#:  [id number] BIRTHDATE:  March 05, 1956, 55 yrs. old  GENDER:  female ENDOSCOPIST:  Iva Boop, MD, [facility]  PROCEDURE DATE:  06/29/2011 PROCEDURE:  Colonoscopy with snare polypectomy ASA CLASS:  Class II INDICATIONS:  change in bowel habits MEDICATIONS:   There was residual sedation effect present from prior procedure., These medications were titrated to patient response per physician's verbal order, Fentanyl 25 mcg IV, Versed 4 mg IV  DESCRIPTION OF PROCEDURE:   After the risks benefits and alternatives of the procedure were thoroughly explained, informed consent was obtained.  Digital rectal exam was performed and revealed no abnormalities.   The LB 180AL [id number] endoscope was introduced through the anus and advanced to the cecum, which was identified by both the appendix and ileocecal valve, without limitations.  The quality of the prep was good, using MoviPrep. The instrument was then slowly withdrawn as the colon was fully examined. <<PROCEDUREIMAGES>>  FINDINGS:  A diminutive polyp was found in the sigmoid colon. Polyp was snared without cautery. Retrieval was successful. Mild diverticulosis was found in the sigmoid colon.  This was otherwise a normal examination of the colon. The colon was somewhat redundant.   Retroflexion was not performed.  The time to cecum = 5:59 minutes. The scope was then withdrawn in 12:06 minutes from the cecum and the procedure completed. COMPLICATIONS:  None ENDOSCOPIC IMPRESSION: 1) Diminutive polyp in the sigmoid colon - removed 2) Mild diverticulosis in the sigmoid colon 3) Otherwise normal examination, somewhat redundant colon, good prep RECOMMENDATIONS: Use MiraLax more regularly (daily) for constipation. She did not have a good response to linaclotide. REPEAT EXAM:  In for Colonoscopy, pending biopsy  results.  Iva Boop, MD, [facility]  CC:  The Patient and Jarome Matin, MD  n. Rosalie Doctor:   Iva Boop at 06/29/2011 03:35 PM  Temple Pacini, [phone]

## 2011-06-30 ENCOUNTER — Telehealth: Payer: Self-pay | Admitting: *Deleted

## 2011-06-30 NOTE — Telephone Encounter (Signed)
Pt. assessed as to location of pain, chest or abdomen?  Stated pain was more mid abdomen.  States she is passing a lot of  Gas since getting to work, and that pain is much better, and she is comfortable. Pt. Advised to call if she develops more  Intense bloating or fever.  She was questioned and denied any difficulty swallowing.

## 2011-06-30 NOTE — Telephone Encounter (Signed)
Ok.  Follow up as needed.

## 2011-06-30 NOTE — Telephone Encounter (Signed)
Find out where the pain is exactly - ? Chest or abdomen  Any pain with swallowing?  Likely just sore from the procedures but if fever or other problems could be more serious

## 2011-06-30 NOTE — Telephone Encounter (Signed)
Follow up Call- Patient questions:  Do you have a fever, pain , or abdominal swelling? yes Pain Score  5 *  Have you tolerated food without any problems? yes  Have you been able to return to your normal activities? yes  Do you have any questions about your discharge instructions: Diet   no Medications  no Follow up visit  no  Do you have questions or concerns about your Care? yes  Actions: * If pain score is 4 or above: Physician/ provider Notified : Stan Head, MD   Pt. Stated that she "hurts pretty bad". States she is bloated and abdomen is painful.  Passing a little gas.  Rates her pain as a 5.  Slept well last night after taking sleeping medication. Has returned to work this AM  NOTIFYING DR. Leone Payor FOR RECOMENDATION.Marland Kitchen

## 2011-07-02 ENCOUNTER — Encounter: Payer: Self-pay | Admitting: Internal Medicine

## 2011-07-02 NOTE — Progress Notes (Signed)
Quick Note:  Diminutive adenoma Recall letter to be sent in 06/2016 5 years ______

## 2012-08-26 ENCOUNTER — Encounter: Payer: Self-pay | Admitting: Internal Medicine

## 2012-09-06 ENCOUNTER — Encounter: Payer: Self-pay | Admitting: Internal Medicine

## 2012-09-21 ENCOUNTER — Other Ambulatory Visit: Payer: Self-pay | Admitting: Dermatology

## 2012-09-27 ENCOUNTER — Other Ambulatory Visit: Payer: Self-pay | Admitting: Orthopedic Surgery

## 2012-09-27 DIAGNOSIS — M25461 Effusion, right knee: Secondary | ICD-10-CM

## 2012-09-27 DIAGNOSIS — M25561 Pain in right knee: Secondary | ICD-10-CM

## 2012-10-04 ENCOUNTER — Other Ambulatory Visit: Payer: BC Managed Care – PPO

## 2012-10-10 ENCOUNTER — Ambulatory Visit
Admission: RE | Admit: 2012-10-10 | Discharge: 2012-10-10 | Disposition: A | Discharge status: Home / Self Care | Payer: Medicare HMO | Source: Ambulatory Visit | Attending: Orthopedic Surgery | Admitting: Orthopedic Surgery

## 2012-10-10 DIAGNOSIS — M25561 Pain in right knee: Secondary | ICD-10-CM

## 2012-10-10 DIAGNOSIS — M25461 Effusion, right knee: Secondary | ICD-10-CM

## 2012-10-24 ENCOUNTER — Other Ambulatory Visit: Payer: Self-pay | Admitting: Dermatology

## 2013-01-17 ENCOUNTER — Other Ambulatory Visit: Payer: Self-pay | Admitting: Dermatology

## 2013-05-25 ENCOUNTER — Other Ambulatory Visit: Payer: Self-pay | Admitting: Obstetrics & Gynecology

## 2013-05-31 ENCOUNTER — Other Ambulatory Visit: Payer: Self-pay | Admitting: Obstetrics & Gynecology

## 2013-05-31 DIAGNOSIS — Z803 Family history of malignant neoplasm of breast: Secondary | ICD-10-CM

## 2013-06-07 ENCOUNTER — Other Ambulatory Visit: Payer: Medicare HMO

## 2013-06-17 ENCOUNTER — Ambulatory Visit
Admission: RE | Admit: 2013-06-17 | Discharge: 2013-06-17 | Disposition: A | Discharge status: Home / Self Care | Payer: Medicare HMO | Source: Ambulatory Visit | Attending: Obstetrics & Gynecology | Admitting: Obstetrics & Gynecology

## 2013-06-17 DIAGNOSIS — Z803 Family history of malignant neoplasm of breast: Secondary | ICD-10-CM

## 2013-06-17 MED ORDER — GADOBENATE DIMEGLUMINE 529 MG/ML IV SOLN
14.0000 mL | Freq: Once | INTRAVENOUS | Status: AC | PRN
  Administered 2013-06-17: 14 mL via INTRAVENOUS

## 2013-07-20 ENCOUNTER — Other Ambulatory Visit: Payer: Self-pay | Admitting: Dermatology

## 2013-07-26 ENCOUNTER — Other Ambulatory Visit: Payer: Self-pay | Admitting: Dermatology

## 2013-11-10 ENCOUNTER — Other Ambulatory Visit: Payer: Self-pay | Admitting: Dermatology

## 2014-05-15 ENCOUNTER — Other Ambulatory Visit: Payer: Self-pay | Admitting: Dermatology

## 2014-07-03 ENCOUNTER — Other Ambulatory Visit: Payer: Self-pay | Admitting: *Deleted

## 2014-07-03 ENCOUNTER — Telehealth: Payer: Self-pay | Admitting: Gastroenterology

## 2014-07-03 ENCOUNTER — Encounter: Payer: Self-pay | Admitting: Physician Assistant

## 2014-07-03 ENCOUNTER — Ambulatory Visit (INDEPENDENT_AMBULATORY_CARE_PROVIDER_SITE_OTHER)
Admission: RE | Admit: 2014-07-03 | Discharge: 2014-07-03 | Disposition: A | Discharge status: Home / Self Care | Payer: Medicare HMO | Source: Ambulatory Visit | Attending: Physician Assistant | Admitting: Physician Assistant

## 2014-07-03 ENCOUNTER — Ambulatory Visit (INDEPENDENT_AMBULATORY_CARE_PROVIDER_SITE_OTHER): Payer: Medicare HMO | Admitting: Physician Assistant

## 2014-07-03 VITALS — BP 124/80 | HR 68 | Ht 66.0 in | Wt 142.4 lb

## 2014-07-03 DIAGNOSIS — K589 Irritable bowel syndrome without diarrhea: Secondary | ICD-10-CM

## 2014-07-03 DIAGNOSIS — K5909 Other constipation: Secondary | ICD-10-CM

## 2014-07-03 DIAGNOSIS — R1310 Dysphagia, unspecified: Secondary | ICD-10-CM

## 2014-07-03 MED ORDER — LACTULOSE 10 GM/15ML PO SOLN: ORAL | Status: DC

## 2014-07-03 NOTE — Progress Notes (Signed)
Patient ID: Cynthia Poole, female   DOB: 03-Feb-1956, 59 y.o.   MRN: [id number]    HPI:    Cynthia Poole is a 59 year old female referred for evaluation by Dr. Bevelyn Buckles due to constipation.  And is known to Dr. Carlean Purl with a history of IBS, constipation, and intermittent dysphagia. She reports that she has been constipated for much of her adult life. She blames this to using amitriptyline for over 20 years for chronic headaches. She has used Mira lax in the past for her constipation and states it'll work for a few weeks and then looses effect. She has been given a trial of linaclotide in the past but states she was unable to tolerate it because it made her feel so sick. Currently, she is skipping up to 2 weeks between bowel movements and then passes small soft stools. Was in the emergency room December 23 with dehydration and diarrhea she was given IV fluids and felt better and was sent home. Several days later she felt constipated and called her primary care physician. She states a colonoscopy prep was called in she took it and had no relief. Her last bowel movement was this morning and she describes it has small soft broken pieces. She has had no bright red blood per rectum or melena. She feels excessively gassy, bloated, and distended. She has no urinary dribbling or incontinence. She does not have to press on the perineal area to assist evacuation of stools.  She also has been having dysphagia to meats for about a year. She has no dysphagia to liquids. She describes some epigastric discomfort and nausea is worse on an empty stomach and alleviated with ingestion of small amounts of food. She has been having some heartburn but has not been using anything for it and she prefers not to take medications. She states she is ultra sensitive to medications and that most medications cause her to have migraines. There has been no weight loss.   Past Medical History  Diagnosis Date  . MVP (mitral valve  prolapse)   . Heart murmur   . History of pleurisy   . Seasonal allergies   . Vitamin D deficiency   . Osteopenia   . Internal and external bleeding hemorrhoids   . Depression   . Hypothyroidism   . HLD (hyperlipidemia)   . Migraines   . GERD (gastroesophageal reflux disease)   . Fibromyalgia 03/2004  . DDD (degenerative disc disease), cervical   . DDD (degenerative disc disease), lumbar   . Hodgkin's disease   . Insomnia   . Anemia   . Personal history of colonic adenoma 06/29/2011    06/29/2011 - diminutive adenoma    Past Surgical History  Procedure Laterality Date  . Splenectomy, total  1996  . Tonsillectomy  1965  . Strabismus surgery  02/2009    left  . Colonoscopy  09/02/2007    redundant colon, external hemorrhoids  . Bunionectomy    . Deep neck lymph node biopsy / excision  1996    right LN   . Colonoscopy  06/29/2011    diminutive polyp, diverticulosis, external hemorrhoids  . Esophagogastroduodenoscopy  06/29/2011    notrmal, 54 Fr dilation (dysphagia)   Family History  Problem Relation Age of Onset  . Heart disease Maternal Grandfather   . Heart disease Paternal Grandfather   . Heart disease      uncle  . Aneurysm Father     AAA  . Hypertension Father   .  Tuberculosis Father   . Breast cancer Maternal Grandmother   . Breast cancer Maternal Aunt   . Arthritis Mother   . Colon cancer Neg Hx   . Breast cancer Sister     x 2    History  Substance Use Topics  . Smoking status: Never Smoker   . Smokeless tobacco: Never Used  . Alcohol Use: 0.0 oz/week    2-3 Glasses of wine per week   Current Outpatient Prescriptions  Medication Sig Dispense Refill  . ALPRAZolam (XANAX) 0.5 MG tablet Take 0.5 mg by mouth daily as needed. For anxiety.    Marland Kitchen aspirin-acetaminophen-caffeine (EXCEDRIN MIGRAINE) 250-250-65 MG per tablet Take 1 tablet by mouth every 6 (six) hours as needed.      . Cholecalciferol (VITAMIN D) 2000 UNITS CAPS Take 1 capsule by mouth daily.      .  DULoxetine (CYMBALTA) 20 MG capsule Take 20 mg by mouth daily.  0  . estradiol (VIVELLE-DOT) 0.05 MG/24HR Place 1 patch onto the skin 2 (two) times a week.     . magnesium 30 MG tablet Take 30 mg by mouth daily.    . potassium chloride SA (K-DUR,KLOR-CON) 20 MEQ tablet Take 20 mEq by mouth daily.    . Probiotic Product (ALIGN) 4 MG CAPS Take 1 capsule by mouth daily.    . vitamin B-12 (CYANOCOBALAMIN) 1000 MCG tablet Take 1,000 mcg by mouth daily.     No current facility-administered medications for this visit.   Allergies  Allergen Reactions  . Zofran     zofran causes drop in blood pressure     Review of Systems: Gen: Denies any fever, chills, sweats, anorexia, fatigue, weakness, malaise, weight loss, and sleep disorder CV: Denies chest pain, angina, palpitations, syncope, orthopnea, PND, peripheral edema, and claudication. Resp: Denies dyspnea at rest, dyspnea with exercise, cough, sputum, wheezing, coughing up blood, and pleurisy. GI: Denies vomiting blood, jaundice, and fecal incontinence.   Has dysphagia to meats. GU : Denies urinary burning, blood in urine, urinary frequency, urinary hesitancy, nocturnal urination, and urinary incontinence. MS: Denies joint pain, limitation of movement, and swelling, stiffness, low back pain, extremity pain. Denies muscle weakness, cramps, atrophy.  Derm: Denies rash, itching, dry skin, hives, moles, warts, or unhealing ulcers.  Psych: Denies depression, anxiety, memory loss, suicidal ideation, hallucinations, paranoia, and confusion. Heme: Denies bruising, bleeding, and enlarged lymph nodes. Neuro:  Denies any headaches, dizziness, paresthesias. Endo:  Denies any problems with DM, thyroid, adrenal function   Prior Endoscopies:   Colonoscopy on 06/29/2011 had a diminutive polyp in the sigmoid which was removed. There was mild diverticulosis in the sigmoid. Otherwise normal examination with a somewhat redundant colon. EGD on 06/29/2011 was done  for dysphagia it was a normal EGD.  Physical Exam: BP 124/80 mmHg  Pulse 68  Ht 5\' 6"  (1.676 m)  Wt 142 lb 6.4 oz (64.592 kg)  BMI 22.99 kg/m2 Constitutional: Pleasant,well-developed female in no acute distress. HEENT: Normocephalic and atraumatic. Conjunctivae are normal. No scleral icterus. Neck supple. No thyromegaly Cardiovascular: Normal rate, regular rhythm.  Pulmonary/chest: Effort normal and breath sounds normal. No wheezing, rales or rhonchi. Abdominal: Soft, nondistended, nontender. Bowel sounds active throughout. There are no masses palpable. No hepatomegaly. Extremities: no edema Lymphadenopathy: No cervical adenopathy noted. Neurological: Alert and oriented to person place and time. Skin: Skin is warm and dry. No rashes noted. Psychiatric: Normal mood and affect. Behavior is normal.  ASSESSMENT AND PLAN: #1 IBS and constipation. Abdominal films will  be obtained today to ascertain if she has a large stool burden. If she does, a Mira lax purge will be given. If not, she will be given a trial of fiber, P fruits, and lactulose as she says Mira lax on a daily basis does not work for her. She has not been able to tolerate linaclotide in the past. If she has no relief with lactulose and fiber she will be considered for a trial of Amitiza. If her symptoms continue to persist, she may be considered for a colon transit study.  #2. Dysphagia and epigastric discomfort. He is may be due cysts some poorly controlled reflux. The patient is not interested in trying a PPI as she is leery that they may cause headaches. She will try Zantac OTC 75 mg daily. She will be scheduled for an esophagram with barium pill to evaluate for stricture or dysmotility. An antireflux regimen has been reviewed. She will return in 4 weeks for follow-up. If she has had no improvement of her symptoms she will be scheduled for an EGD.    Lynnox Girten, Vita Barley PA-C 07/03/2014, 2:00 PM

## 2014-07-03 NOTE — Patient Instructions (Signed)
Today you have been given a handout on anti-reflux measures to read and follow.  Take zantac 75mg  (over the counter) daily.  Please go to the basement before leaving to our x-ray department to have an abdominal film taken.  We will call you with instructions for your constipation once we get the results from your abdominal films.  You have been scheduled for a Barium Esophogram at [facility] (1st floor of the hospital) on 07/04/14 at 11:30am. Please arrive 15 minutes prior to your appointment for registration. Make certain not to have anything to eat or drink 6 hours prior to your test. If you need to reschedule for any reason, please contact radiology at [phone] to do so. __________________________________________________________________ A barium swallow is an examination that concentrates on views of the esophagus. This tends to be a double contrast exam (barium and two liquids which, when combined, create a gas to distend the wall of the oesophagus) or single contrast (non-ionic iodine based). The study is usually tailored to your symptoms so a good history is essential. Attention is paid during the study to the form, structure and configuration of the esophagus, looking for functional disorders (such as aspiration, dysphagia, achalasia, motility and reflux) EXAMINATION You may be asked to change into a gown, depending on the type of swallow being performed. A radiologist and radiographer will perform the procedure. The radiologist will advise you of the type of contrast selected for your procedure and direct you during the exam. You will be asked to stand, sit or lie in several different positions and to hold a small amount of fluid in your mouth before being asked to swallow while the imaging is performed .In some instances you may be asked to swallow barium coated marshmallows to assess the motility of a solid food bolus. The exam can be recorded as a digital or video fluoroscopy  procedure. POST PROCEDURE It will take 1-2 days for the barium to pass through your system. To facilitate this, it is important, unless otherwise directed, to increase your fluids for the next 24-48hrs and to resume your normal diet.  This test typically takes about 30 minutes to perform. __________________________________________________________________________________ Cecille Rubin wants to see you in a month.  We will contact you with that appointment.  I appreciate the opportunity to care for you.

## 2014-07-03 NOTE — Telephone Encounter (Signed)
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

## 2014-07-03 NOTE — Telephone Encounter (Signed)
Patient with a 2 week history of upper abdominal pain and constipation.  Was prescribed a bowel prep with minimal pain relief.  It did help with the constipation.  She will come in today and see Lori Hvozdovic, PA at 1:15

## 2014-07-04 ENCOUNTER — Ambulatory Visit (HOSPITAL_COMMUNITY)
Admission: RE | Admit: 2014-07-04 | Discharge: 2014-07-04 | Disposition: A | Discharge status: Home / Self Care | Payer: Medicare HMO | Source: Ambulatory Visit | Attending: Physician Assistant | Admitting: Physician Assistant

## 2014-07-04 DIAGNOSIS — K224 Dyskinesia of esophagus: Secondary | ICD-10-CM | POA: Diagnosis not present

## 2014-07-04 DIAGNOSIS — R4702 Dysphasia: Secondary | ICD-10-CM | POA: Diagnosis present

## 2014-07-04 DIAGNOSIS — Z8571 Personal history of Hodgkin lymphoma: Secondary | ICD-10-CM | POA: Insufficient documentation

## 2014-07-04 DIAGNOSIS — R1310 Dysphagia, unspecified: Secondary | ICD-10-CM

## 2014-07-04 NOTE — Progress Notes (Signed)
Agree w/ Ms. Hvozdovic's note and mangement.  

## 2014-07-05 NOTE — Progress Notes (Signed)
Quick Note:  Agree - schedule EGD and possible dilation ______

## 2014-07-06 ENCOUNTER — Telehealth: Payer: Self-pay | Admitting: *Deleted

## 2014-07-06 NOTE — Telephone Encounter (Signed)
Patient left a message that she wants to schedule the EGD with possible dil. States she does not have a preferred time just schedule and then call her with appointment. Attempted to call patient. Left a message for her to call back. Scheduled EGD on 07/19/14 at 10:00 AM and pre visit on 07/12/14 at 4:00 PM.

## 2014-07-10 NOTE — Telephone Encounter (Signed)
Left a message for patient to call back. 

## 2014-07-11 ENCOUNTER — Telehealth: Payer: Self-pay | Admitting: *Deleted

## 2014-07-11 NOTE — Telephone Encounter (Signed)
Left a message for patient with appointments dates and times. Requested she call me if she cannot keep these.

## 2014-07-11 NOTE — Telephone Encounter (Signed)
Spoke with patient and she is hesitant about having EGD again. She had one in 2013 and she is asking for more information on why she needs to have it again. Please, advise.

## 2014-07-12 NOTE — Telephone Encounter (Signed)
Left a message for patient to call back. 

## 2014-07-12 NOTE — Telephone Encounter (Signed)
We were trying to help her swallowing problems by dilating the esophagus  We can work her in somewhere to see me and go over things if she wants to do that before having EGD

## 2014-07-16 ENCOUNTER — Telehealth: Payer: Self-pay | Admitting: Internal Medicine

## 2014-07-16 NOTE — Telephone Encounter (Signed)
See other phone note from 07/11/14 for additional detailes

## 2014-07-16 NOTE — Telephone Encounter (Signed)
I have scheduled the appointment for her to see Dr. Carlean Purl on 07/27/14 3:30.  She asked that I cancel the appt for EGD until her appt

## 2014-07-19 ENCOUNTER — Encounter: Payer: Medicare HMO | Admitting: Internal Medicine

## 2014-07-27 ENCOUNTER — Ambulatory Visit (INDEPENDENT_AMBULATORY_CARE_PROVIDER_SITE_OTHER): Payer: Medicare HMO | Admitting: Internal Medicine

## 2014-07-27 ENCOUNTER — Encounter: Payer: Self-pay | Admitting: Internal Medicine

## 2014-07-27 VITALS — BP 106/70 | HR 64 | Ht 65.0 in | Wt 140.2 lb

## 2014-07-27 DIAGNOSIS — K589 Irritable bowel syndrome without diarrhea: Secondary | ICD-10-CM

## 2014-07-27 DIAGNOSIS — K224 Dyskinesia of esophagus: Secondary | ICD-10-CM

## 2014-07-27 NOTE — Progress Notes (Signed)
   Subjective:    Patient ID: Cynthia Poole, female    DOB: 1956/03/01, 59 y.o.   MRN: [id number]  HPI The patient is a pleasant middle-aged white woman with known IBS. She recently saw one of our advanced practitioners with complaints of dysphagia and worsening constipation. She had taken some sort of antibiotic for sinus problems in the fall became more constipated and had an epigastric fullness associated with that. She was prescribed colonoscopy prep solution which helped some, by her primary care provider but follow through with a GI visit as mentioned above. Since she saw [place] she had a barium swallow which showed esophageal dysmotility. She was recommended to consider an upper endoscopy because she had that with dilation in the past with some help but she tells [place] now that that really was a transient benefit and she is really not inclined to pursue that. Her bowels are more regular now using a juicing regimen with vegetables and fruits. She does not like to take laxatives or medications at all if she can avoid it. She feels improved at this time. Colonoscopy with a single adenoma and some diverticulosis in 2013. Medications, allergies, past medical history, past surgical history, family history and social history are reviewed and updated in the EMR.    Review of Systems As above    Objective:   Physical Exam BP 106/70 mmHg  Pulse 64  Ht 5\' 5"  (1.651 m)  Wt 140 lb 4 oz (63.617 kg)  BMI 23.34 kg/m2 Well-developed well-nourished no acute distress    Assessment & Plan:   1. IBS (irritable bowel syndrome) - constipation predominant   2. Esophageal dysmotility    1. She will continue with her juicing regimen for her constipation predominant irritable bowel syndrome. 2. She will watch her diet and modify that as best possible to treat the dysphagia with esophageal dysmotility. 3. Return as needed to GI.  CC: Donnajean Lopes, MD

## 2014-07-27 NOTE — Patient Instructions (Signed)
   Glad that the juicing is helping. If your problems get worse (I hope not!) please call back and let me know.  I appreciate the opportunity to care for you. Gatha Mayer, MD, [facility]

## 2015-09-04 ENCOUNTER — Other Ambulatory Visit: Payer: Self-pay | Admitting: Obstetrics & Gynecology

## 2015-09-04 DIAGNOSIS — Z1231 Encounter for screening mammogram for malignant neoplasm of breast: Secondary | ICD-10-CM

## 2015-09-05 ENCOUNTER — Other Ambulatory Visit: Payer: Self-pay | Admitting: Obstetrics & Gynecology

## 2015-09-05 DIAGNOSIS — Z139 Encounter for screening, unspecified: Secondary | ICD-10-CM

## 2015-09-17 ENCOUNTER — Ambulatory Visit: Payer: Medicare HMO

## 2016-06-22 DIAGNOSIS — B029 Zoster without complications: Secondary | ICD-10-CM

## 2016-06-22 HISTORY — DX: Zoster without complications: B02.9

## 2016-06-26 DIAGNOSIS — M545 Low back pain: Secondary | ICD-10-CM | POA: Diagnosis not present

## 2016-06-26 DIAGNOSIS — Z1389 Encounter for screening for other disorder: Secondary | ICD-10-CM | POA: Diagnosis not present

## 2016-06-26 DIAGNOSIS — F418 Other specified anxiety disorders: Secondary | ICD-10-CM | POA: Diagnosis not present

## 2016-06-26 DIAGNOSIS — M79641 Pain in right hand: Secondary | ICD-10-CM | POA: Diagnosis not present

## 2016-07-15 DIAGNOSIS — M79641 Pain in right hand: Secondary | ICD-10-CM | POA: Diagnosis not present

## 2016-07-15 DIAGNOSIS — M79642 Pain in left hand: Secondary | ICD-10-CM | POA: Diagnosis not present

## 2016-07-15 DIAGNOSIS — G5603 Carpal tunnel syndrome, bilateral upper limbs: Secondary | ICD-10-CM | POA: Diagnosis not present

## 2016-07-15 DIAGNOSIS — M180 Bilateral primary osteoarthritis of first carpometacarpal joints: Secondary | ICD-10-CM | POA: Diagnosis not present

## 2016-07-29 ENCOUNTER — Encounter: Payer: Self-pay | Admitting: Internal Medicine

## 2016-08-03 DIAGNOSIS — N760 Acute vaginitis: Secondary | ICD-10-CM | POA: Diagnosis not present

## 2016-08-24 DIAGNOSIS — H16223 Keratoconjunctivitis sicca, not specified as Sjogren's, bilateral: Secondary | ICD-10-CM | POA: Diagnosis not present

## 2016-09-24 DIAGNOSIS — F331 Major depressive disorder, recurrent, moderate: Secondary | ICD-10-CM | POA: Diagnosis not present

## 2016-09-29 ENCOUNTER — Encounter (HOSPITAL_COMMUNITY): Payer: Self-pay

## 2016-09-29 ENCOUNTER — Inpatient Hospital Stay (HOSPITAL_COMMUNITY)
Admission: EM | Admit: 2016-09-29 | Discharge: 2016-09-30 | DRG: 247 | Disposition: A | Discharge status: Home / Self Care | Payer: BLUE CROSS/BLUE SHIELD | Attending: Cardiology | Admitting: Cardiology

## 2016-09-29 ENCOUNTER — Emergency Department (HOSPITAL_COMMUNITY): Payer: BLUE CROSS/BLUE SHIELD

## 2016-09-29 ENCOUNTER — Encounter (HOSPITAL_COMMUNITY)
Admission: EM | Disposition: A | Discharge status: Home / Self Care | Payer: Self-pay | Source: Home / Self Care | Attending: Cardiology

## 2016-09-29 DIAGNOSIS — Z8571 Personal history of Hodgkin lymphoma: Secondary | ICD-10-CM | POA: Diagnosis not present

## 2016-09-29 DIAGNOSIS — Z79899 Other long term (current) drug therapy: Secondary | ICD-10-CM

## 2016-09-29 DIAGNOSIS — I341 Nonrheumatic mitral (valve) prolapse: Secondary | ICD-10-CM | POA: Diagnosis not present

## 2016-09-29 DIAGNOSIS — I200 Unstable angina: Secondary | ICD-10-CM | POA: Diagnosis present

## 2016-09-29 DIAGNOSIS — E559 Vitamin D deficiency, unspecified: Secondary | ICD-10-CM | POA: Diagnosis not present

## 2016-09-29 DIAGNOSIS — M8580 Other specified disorders of bone density and structure, unspecified site: Secondary | ICD-10-CM | POA: Diagnosis present

## 2016-09-29 DIAGNOSIS — B029 Zoster without complications: Secondary | ICD-10-CM | POA: Diagnosis present

## 2016-09-29 DIAGNOSIS — Z9081 Acquired absence of spleen: Secondary | ICD-10-CM

## 2016-09-29 DIAGNOSIS — M797 Fibromyalgia: Secondary | ICD-10-CM | POA: Diagnosis not present

## 2016-09-29 DIAGNOSIS — R9431 Abnormal electrocardiogram [ECG] [EKG]: Secondary | ICD-10-CM | POA: Diagnosis not present

## 2016-09-29 DIAGNOSIS — F329 Major depressive disorder, single episode, unspecified: Secondary | ICD-10-CM | POA: Diagnosis not present

## 2016-09-29 DIAGNOSIS — I25110 Atherosclerotic heart disease of native coronary artery with unstable angina pectoris: Principal | ICD-10-CM | POA: Diagnosis present

## 2016-09-29 DIAGNOSIS — R079 Chest pain, unspecified: Secondary | ICD-10-CM | POA: Diagnosis not present

## 2016-09-29 DIAGNOSIS — Z803 Family history of malignant neoplasm of breast: Secondary | ICD-10-CM

## 2016-09-29 DIAGNOSIS — G4700 Insomnia, unspecified: Secondary | ICD-10-CM | POA: Diagnosis present

## 2016-09-29 DIAGNOSIS — Z923 Personal history of irradiation: Secondary | ICD-10-CM | POA: Diagnosis not present

## 2016-09-29 DIAGNOSIS — K5909 Other constipation: Secondary | ICD-10-CM | POA: Diagnosis not present

## 2016-09-29 DIAGNOSIS — I10 Essential (primary) hypertension: Secondary | ICD-10-CM | POA: Diagnosis not present

## 2016-09-29 DIAGNOSIS — Z79890 Hormone replacement therapy (postmenopausal): Secondary | ICD-10-CM | POA: Diagnosis not present

## 2016-09-29 DIAGNOSIS — Z8261 Family history of arthritis: Secondary | ICD-10-CM | POA: Diagnosis not present

## 2016-09-29 DIAGNOSIS — R42 Dizziness and giddiness: Secondary | ICD-10-CM | POA: Diagnosis not present

## 2016-09-29 DIAGNOSIS — R1013 Epigastric pain: Secondary | ICD-10-CM | POA: Diagnosis not present

## 2016-09-29 DIAGNOSIS — I2510 Atherosclerotic heart disease of native coronary artery without angina pectoris: Secondary | ICD-10-CM

## 2016-09-29 DIAGNOSIS — E039 Hypothyroidism, unspecified: Secondary | ICD-10-CM | POA: Diagnosis not present

## 2016-09-29 DIAGNOSIS — Z888 Allergy status to other drugs, medicaments and biological substances status: Secondary | ICD-10-CM | POA: Diagnosis not present

## 2016-09-29 DIAGNOSIS — E785 Hyperlipidemia, unspecified: Secondary | ICD-10-CM | POA: Diagnosis present

## 2016-09-29 DIAGNOSIS — R0789 Other chest pain: Secondary | ICD-10-CM | POA: Diagnosis not present

## 2016-09-29 DIAGNOSIS — K589 Irritable bowel syndrome without diarrhea: Secondary | ICD-10-CM | POA: Diagnosis present

## 2016-09-29 DIAGNOSIS — Z8249 Family history of ischemic heart disease and other diseases of the circulatory system: Secondary | ICD-10-CM | POA: Diagnosis not present

## 2016-09-29 DIAGNOSIS — Z955 Presence of coronary angioplasty implant and graft: Secondary | ICD-10-CM

## 2016-09-29 DIAGNOSIS — K219 Gastro-esophageal reflux disease without esophagitis: Secondary | ICD-10-CM | POA: Diagnosis present

## 2016-09-29 HISTORY — DX: Atherosclerotic heart disease of native coronary artery without angina pectoris: I25.10

## 2016-09-29 HISTORY — PX: LEFT HEART CATH AND CORONARY ANGIOGRAPHY: CATH118249

## 2016-09-29 HISTORY — PX: CORONARY STENT INTERVENTION: CATH118234

## 2016-09-29 HISTORY — PX: INTRAVASCULAR ULTRASOUND/IVUS: CATH118244

## 2016-09-29 HISTORY — PX: CARDIAC CATHETERIZATION: SHX172

## 2016-09-29 LAB — BASIC METABOLIC PANEL
Anion gap: 10 (ref 5–15)
BUN: 9 mg/dL (ref 6–20)
CO2: 24 mmol/L (ref 22–32)
Calcium: 9.2 mg/dL (ref 8.9–10.3)
Chloride: 101 mmol/L (ref 101–111)
Creatinine, Ser: 0.85 mg/dL (ref 0.44–1.00)
GFR calc Af Amer: >60 mL/min (ref >60)
GFR calc non Af Amer: >60 mL/min (ref >60)
Glucose, Bld: 168 mg/dL — ABNORMAL HIGH (ref 65–99)
Potassium: 3.9 mmol/L (ref 3.5–5.1)
Sodium: 135 mmol/L (ref 135–145)

## 2016-09-29 LAB — LIPID PANEL
CHOLESTEROL: 225 mg/dL — AB (ref 0–200)
HDL: 71 mg/dL (ref >40)
LDL Cholesterol: 136 mg/dL — ABNORMAL HIGH (ref 0–99)
Total CHOL/HDL Ratio: 3.2 RATIO
Triglycerides: 88 mg/dL (ref <150)
VLDL: 18 mg/dL (ref 0–40)

## 2016-09-29 LAB — POCT ACTIVATED CLOTTING TIME
ACTIVATED CLOTTING TIME: 219 s
ACTIVATED CLOTTING TIME: 279 s

## 2016-09-29 LAB — PROTIME-INR
INR: 1.15
PROTHROMBIN TIME: 14.8 s (ref 11.4–15.2)

## 2016-09-29 LAB — CBC
HCT: 36.7 % (ref 36.0–46.0)
Hemoglobin: 12.3 g/dL (ref 12.0–15.0)
MCH: 30.7 pg (ref 26.0–34.0)
MCHC: 33.5 g/dL (ref 30.0–36.0)
MCV: 91.5 fL (ref 78.0–100.0)
Platelets: 287 10*3/uL (ref 150–400)
RBC: 4.01 MIL/uL (ref 3.87–5.11)
RDW: 13.6 % (ref 11.5–15.5)
WBC: 6.1 10*3/uL (ref 4.0–10.5)

## 2016-09-29 LAB — I-STAT TROPONIN, ED: Troponin i, poc: 0 ng/mL (ref 0.00–0.08)

## 2016-09-29 LAB — TROPONIN I
TROPONIN I: 0.03 ng/mL — AB (ref <0.03)
Troponin I: <0.03 ng/mL (ref <0.03)

## 2016-09-29 LAB — TSH: TSH: 4.51 u[IU]/mL — AB (ref 0.350–4.500)

## 2016-09-29 SURGERY — LEFT HEART CATH AND CORONARY ANGIOGRAPHY
Anesthesia: LOCAL

## 2016-09-29 MED ORDER — ATORVASTATIN CALCIUM 80 MG PO TABS: 80.0000 mg | ORAL_TABLET | Freq: Every day | ORAL | Status: DC

## 2016-09-29 MED ORDER — TICAGRELOR 90 MG PO TABS
90.0000 mg | ORAL_TABLET | Freq: Two times a day (BID) | ORAL | Status: DC
  Administered 2016-09-30: 90 mg via ORAL
  Filled 2016-09-29 (×2): qty 1

## 2016-09-29 MED ORDER — ANGIOPLASTY BOOK
Freq: Once | Status: AC
  Administered 2016-09-30
  Filled 2016-09-29: qty 1

## 2016-09-29 MED ORDER — SODIUM CHLORIDE 0.9% FLUSH: 3.0000 mL | INTRAVENOUS | Status: DC | PRN

## 2016-09-29 MED ORDER — SODIUM CHLORIDE 0.9% FLUSH: 3.0000 mL | Freq: Two times a day (BID) | INTRAVENOUS | Status: DC

## 2016-09-29 MED ORDER — HEPARIN SODIUM (PORCINE) 1000 UNIT/ML IJ SOLN
INTRAMUSCULAR | Status: AC
  Filled 2016-09-29: qty 1

## 2016-09-29 MED ORDER — DIAZEPAM 5 MG PO TABS: 5.0000 mg | ORAL_TABLET | ORAL | Status: DC | PRN

## 2016-09-29 MED ORDER — ASPIRIN 81 MG PO CHEW: 324.0000 mg | CHEWABLE_TABLET | ORAL | Status: DC

## 2016-09-29 MED ORDER — HYDROMORPHONE HCL 1 MG/ML IJ SOLN
INTRAMUSCULAR | Status: AC
  Filled 2016-09-29: qty 1

## 2016-09-29 MED ORDER — NITROGLYCERIN 1 MG/10 ML FOR IR/CATH LAB
INTRA_ARTERIAL | Status: DC | PRN
  Administered 2016-09-29 (×2): 200 ug via INTRACORONARY

## 2016-09-29 MED ORDER — MIDAZOLAM HCL 2 MG/2ML IJ SOLN
INTRAMUSCULAR | Status: DC | PRN
  Administered 2016-09-29: 2 mg via INTRAVENOUS

## 2016-09-29 MED ORDER — VITAMIN D 1000 UNITS PO TABS
2000.0000 [IU] | ORAL_TABLET | Freq: Every day | ORAL | Status: DC
  Administered 2016-09-29 – 2016-09-30 (×2): 2000 [IU] via ORAL
  Filled 2016-09-29 (×2): qty 2

## 2016-09-29 MED ORDER — DIPHENHYDRAMINE HCL 50 MG/ML IJ SOLN
INTRAMUSCULAR | Status: DC | PRN
  Administered 2016-09-29: 12.5 mg via INTRAVENOUS

## 2016-09-29 MED ORDER — POTASSIUM CHLORIDE CRYS ER 20 MEQ PO TBCR
20.0000 meq | EXTENDED_RELEASE_TABLET | Freq: Every day | ORAL | Status: DC
Start: 2016-09-30 — End: 2016-09-30
  Filled 2016-09-29: qty 1

## 2016-09-29 MED ORDER — ASPIRIN EC 81 MG PO TBEC
81.0000 mg | DELAYED_RELEASE_TABLET | Freq: Every day | ORAL | Status: DC
Start: 2016-09-30 — End: 2016-09-30
  Administered 2016-09-30: 81 mg via ORAL
  Filled 2016-09-29: qty 1

## 2016-09-29 MED ORDER — IOPAMIDOL (ISOVUE-370) INJECTION 76%
INTRAVENOUS | Status: DC | PRN
  Administered 2016-09-29: 145 mL via INTRA_ARTERIAL

## 2016-09-29 MED ORDER — POLYETHYLENE GLYCOL 3350 17 G PO PACK
34.0000 g | PACK | Freq: Every day | ORAL | Status: DC
  Administered 2016-09-29 – 2016-09-30 (×2): 34 g via ORAL
  Filled 2016-09-29 (×2): qty 2

## 2016-09-29 MED ORDER — SODIUM CHLORIDE 0.9 % IV SOLN: 250.0000 mL | INTRAVENOUS | Status: DC | PRN

## 2016-09-29 MED ORDER — VERAPAMIL HCL 2.5 MG/ML IV SOLN
INTRA_ARTERIAL | Status: DC | PRN
  Administered 2016-09-29: 10 mL via INTRA_ARTERIAL

## 2016-09-29 MED ORDER — SODIUM CHLORIDE 0.9% FLUSH
3.0000 mL | Freq: Two times a day (BID) | INTRAVENOUS | Status: DC
  Administered 2016-09-30: 3 mL via INTRAVENOUS

## 2016-09-29 MED ORDER — VITAMIN B-12 1000 MCG PO TABS
1000.0000 ug | ORAL_TABLET | Freq: Every day | ORAL | Status: DC
  Administered 2016-09-29 – 2016-09-30 (×2): 1000 ug via ORAL
  Filled 2016-09-29 (×2): qty 1

## 2016-09-29 MED ORDER — SODIUM CHLORIDE 0.9 % WEIGHT BASED INFUSION: 1.0000 mL/kg/h | INTRAVENOUS | Status: DC

## 2016-09-29 MED ORDER — MIDAZOLAM HCL 2 MG/2ML IJ SOLN
INTRAMUSCULAR | Status: AC
  Filled 2016-09-29: qty 2

## 2016-09-29 MED ORDER — ADENOSINE 12 MG/4ML IV SOLN
INTRAVENOUS | Status: AC
  Filled 2016-09-29: qty 16

## 2016-09-29 MED ORDER — HEPARIN (PORCINE) IN NACL 2-0.9 UNIT/ML-% IJ SOLN
INTRAMUSCULAR | Status: AC
  Filled 2016-09-29: qty 1000

## 2016-09-29 MED ORDER — GI COCKTAIL ~~LOC~~
30.0000 mL | Freq: Once | ORAL | Status: AC
  Administered 2016-09-29: 30 mL via ORAL
  Filled 2016-09-29: qty 30

## 2016-09-29 MED ORDER — IOPAMIDOL (ISOVUE-370) INJECTION 76%
INTRAVENOUS | Status: AC
  Filled 2016-09-29: qty 100

## 2016-09-29 MED ORDER — SODIUM CHLORIDE 0.9 % WEIGHT BASED INFUSION: 3.0000 mL/kg/h | INTRAVENOUS | Status: DC

## 2016-09-29 MED ORDER — ACETAMINOPHEN 325 MG PO TABS
650.0000 mg | ORAL_TABLET | ORAL | Status: DC | PRN
  Administered 2016-09-30: 650 mg via ORAL
  Filled 2016-09-29: qty 2

## 2016-09-29 MED ORDER — HEPARIN (PORCINE) IN NACL 100-0.45 UNIT/ML-% IJ SOLN
800.0000 [IU]/h | INTRAMUSCULAR | Status: DC
  Administered 2016-09-29: 800 [IU]/h via INTRAVENOUS
  Filled 2016-09-29: qty 250

## 2016-09-29 MED ORDER — HEPARIN BOLUS VIA INFUSION
4000.0000 [IU] | Freq: Once | INTRAVENOUS | Status: AC
  Administered 2016-09-29: 4000 [IU] via INTRAVENOUS
  Filled 2016-09-29: qty 4000

## 2016-09-29 MED ORDER — MAGNESIUM HYDROXIDE 400 MG/5ML PO SUSP
30.0000 mL | Freq: Once | ORAL | Status: AC
  Administered 2016-09-29: 30 mL via ORAL
  Filled 2016-09-29: qty 30

## 2016-09-29 MED ORDER — SODIUM CHLORIDE 0.9 % WEIGHT BASED INFUSION
1.0000 mL/kg/h | INTRAVENOUS | Status: DC
  Administered 2016-09-29: 1 mL/kg/h via INTRAVENOUS

## 2016-09-29 MED ORDER — ASPIRIN 300 MG RE SUPP: 300.0000 mg | RECTAL | Status: DC

## 2016-09-29 MED ORDER — ESTRADIOL 0.05 MG/24HR TD PTWK: 0.0500 mg | MEDICATED_PATCH | TRANSDERMAL | Status: DC

## 2016-09-29 MED ORDER — TICAGRELOR 90 MG PO TABS
ORAL_TABLET | ORAL | Status: DC | PRN
  Administered 2016-09-29: 180 mg via ORAL

## 2016-09-29 MED ORDER — HEPARIN (PORCINE) IN NACL 2-0.9 UNIT/ML-% IJ SOLN
INTRAMUSCULAR | Status: DC | PRN
  Administered 2016-09-29: 1000 mL

## 2016-09-29 MED ORDER — LIDOCAINE HCL (PF) 1 % IJ SOLN
INTRAMUSCULAR | Status: AC
  Filled 2016-09-29: qty 30

## 2016-09-29 MED ORDER — LIDOCAINE HCL (PF) 1 % IJ SOLN
INTRAMUSCULAR | Status: DC | PRN
  Administered 2016-09-29: 2 mL via INTRADERMAL

## 2016-09-29 MED ORDER — VALACYCLOVIR HCL 500 MG PO TABS
1000.0000 mg | ORAL_TABLET | Freq: Once | ORAL | Status: AC
  Administered 2016-09-29: 1000 mg via ORAL
  Filled 2016-09-29: qty 2

## 2016-09-29 MED ORDER — PREDNISONE 20 MG PO TABS
40.0000 mg | ORAL_TABLET | Freq: Once | ORAL | Status: AC
  Administered 2016-09-29: 40 mg via ORAL
  Filled 2016-09-29: qty 2

## 2016-09-29 MED ORDER — TICAGRELOR 90 MG PO TABS
ORAL_TABLET | ORAL | Status: AC
  Filled 2016-09-29: qty 2

## 2016-09-29 MED ORDER — NITROGLYCERIN 1 MG/10 ML FOR IR/CATH LAB
INTRA_ARTERIAL | Status: AC
  Filled 2016-09-29: qty 10

## 2016-09-29 MED ORDER — DIPHENHYDRAMINE HCL 50 MG/ML IJ SOLN
INTRAMUSCULAR | Status: AC
  Filled 2016-09-29: qty 1

## 2016-09-29 MED ORDER — ALIGN 4 MG PO CAPS
1.0000 | ORAL_CAPSULE | Freq: Every day | ORAL | Status: DC
Start: 2016-09-29 — End: 2016-09-29

## 2016-09-29 MED ORDER — LABETALOL HCL 5 MG/ML IV SOLN: 10.0000 mg | INTRAVENOUS | Status: DC | PRN

## 2016-09-29 MED ORDER — ASPIRIN 81 MG PO CHEW
324.0000 mg | CHEWABLE_TABLET | Freq: Once | ORAL | Status: AC
  Administered 2016-09-29: 324 mg via ORAL
  Filled 2016-09-29: qty 4

## 2016-09-29 MED ORDER — HYDRALAZINE HCL 20 MG/ML IJ SOLN: 5.0000 mg | INTRAMUSCULAR | Status: DC | PRN

## 2016-09-29 MED ORDER — ALPRAZOLAM 0.5 MG PO TABS
1.0000 mg | ORAL_TABLET | Freq: Every day | ORAL | Status: DC
  Administered 2016-09-30: 1 mg via ORAL
  Filled 2016-09-29: qty 2

## 2016-09-29 MED ORDER — VERAPAMIL HCL 2.5 MG/ML IV SOLN
INTRAVENOUS | Status: AC
  Filled 2016-09-29: qty 2

## 2016-09-29 MED ORDER — HYDROMORPHONE HCL 1 MG/ML IJ SOLN
INTRAMUSCULAR | Status: DC | PRN
  Administered 2016-09-29: 0.5 mg via INTRAVENOUS

## 2016-09-29 MED ORDER — NITROGLYCERIN 0.4 MG SL SUBL: 0.4000 mg | SUBLINGUAL_TABLET | SUBLINGUAL | Status: DC | PRN

## 2016-09-29 MED ORDER — VALACYCLOVIR HCL 500 MG PO TABS
1000.0000 mg | ORAL_TABLET | Freq: Three times a day (TID) | ORAL | Status: DC
  Administered 2016-09-29 – 2016-09-30 (×2): 1000 mg via ORAL
  Filled 2016-09-29 (×3): qty 2

## 2016-09-29 MED ORDER — HEPARIN SODIUM (PORCINE) 1000 UNIT/ML IJ SOLN
INTRAMUSCULAR | Status: DC | PRN
  Administered 2016-09-29: 2000 [IU] via INTRAVENOUS
  Administered 2016-09-29: 5000 [IU] via INTRAVENOUS
  Administered 2016-09-29: 3000 [IU] via INTRAVENOUS

## 2016-09-29 SURGICAL SUPPLY — 21 items
BALLN EUPHORA RX 2.5X12 (BALLOONS) ×2
BALLN ~~LOC~~ EUPHORA RX 4.5X12 (BALLOONS) ×2
BALLOON EUPHORA RX 2.5X12 (BALLOONS) IMPLANT
BALLOON ~~LOC~~ EUPHORA RX 4.5X12 (BALLOONS) IMPLANT
CATH LAUNCHER 6FR AL1 (CATHETERS) IMPLANT
CATH OPTICROSS 40MHZ (CATHETERS) ×1 IMPLANT
CATH OPTITORQUE TIG 4.0 5F (CATHETERS) ×1 IMPLANT
CATHETER LAUNCHER 6FR AL1 (CATHETERS) ×2
DEVICE RAD COMP TR BAND LRG (VASCULAR PRODUCTS) ×1 IMPLANT
GLIDESHEATH SLEND A-KIT 6F 20G (SHEATH) ×1 IMPLANT
GUIDEWIRE INQWIRE 1.5J.035X260 (WIRE) IMPLANT
INQWIRE 1.5J .035X260CM (WIRE) ×2
KIT ENCORE 26 ADVANTAGE (KITS) ×1 IMPLANT
KIT ESSENTIALS PG (KITS) ×1 IMPLANT
KIT HEART LEFT (KITS) ×2 IMPLANT
PACK CARDIAC CATHETERIZATION (CUSTOM PROCEDURE TRAY) ×2 IMPLANT
SLED PULL BACK IVUS (MISCELLANEOUS) ×1 IMPLANT
STENT RESOLUTE ONYX 4.0X18 (Permanent Stent) ×1 IMPLANT
TRANSDUCER W/STOPCOCK (MISCELLANEOUS) ×2 IMPLANT
TUBING CIL FLEX 10 FLL-RA (TUBING) ×2 IMPLANT
WIRE RUNTHROUGH .014X180CM (WIRE) ×1 IMPLANT

## 2016-09-29 NOTE — ED Notes (Signed)
Patient transported to X-ray 

## 2016-09-29 NOTE — Interval H&P Note (Signed)
History and Physical Interval Note:  09/29/2016 4:16 PM  Cynthia Poole  has presented today for surgery, with the diagnosis of unstable angina  The various methods of treatment have been discussed with the patient and family. After consideration of risks, benefits and other options for treatment, the patient has consented to  Procedure(s): Left Heart Cath and Coronary Angiography (N/A)  And possible PCI as a surgical intervention .  The patient's history has been reviewed, patient examined, no change in status, stable for surgery.  I have reviewed the patient's chart and labs.  Questions were answered to the patient's satisfaction.   Cath Lab Visit (complete for each Cath Lab visit)  Clinical Evaluation Leading to the Procedure:   ACS: Yes.    Non-ACS:    Anginal Classification: CCS IV  Anti-ischemic medical therapy: No Therapy  Non-Invasive Test Results: No non-invasive testing performed  Prior CABG: No previous CABG        Adrian Prows

## 2016-09-29 NOTE — Care Management Note (Addendum)
Case Management Note  Patient Details  Name: Cynthia Poole MRN: [id number] Date of Birth: Feb 12, 1956  Subjective/Objective:   s/p coronary stent intervention, will be on brilinta, NCM awaiting benefit check.   Patient is for dc today, NCM gave her the 30 day brilinta card, also she states it does not matter what her co pay is she will pay for it because she has to take the medicine.  NCM informed her that once find out what co pay is will let her know, also the [facility] on [address] has it in [place].   NCM informed patient that her deductible has not been met so co pay is 100% of cost.   /W TABITHA @ [employer] # [phone]    BRILINTA  90 MG BID   COVER- YES  CO-PAY- 100 % OF TOTAL COAST /DEDUCTIBLE NOT MET  PRIOR APPROVAL- NO   PHARMACY : [facility]          Action/Plan:   Expected Discharge Date:                  Expected Discharge Plan:  Home/Self Care  In-House Referral:     Discharge planning Services  CM Consult  Post Acute Care Choice:    Choice offered to:     DME Arranged:    DME Agency:     HH Arranged:    [place] Agency:     Status of Service:  In process, will continue to follow  If discussed at Long Length of Stay Meetings, dates discussed:    Additional Comments:  Zenon Mayo, RN 09/29/2016, 8:24 PM

## 2016-09-29 NOTE — ED Notes (Signed)
Pt ambulated to bathroom independently. Pt complaint of dizziness with standing, states is her normal. MD made aware.

## 2016-09-29 NOTE — Progress Notes (Addendum)
Infection disease paged because pt states she has shingles and patient states Dr. Einar Gip agrees she has shingles.  states pt needs to be on contact and airborne with negative pressure room; called Dr. Einar Gip; ordered transfer to tele bed on any unit available with negative pressure room.

## 2016-09-29 NOTE — ED Provider Notes (Signed)
[facility] DEPT Provider Note   CSN: [id number] Arrival date & time: 09/29/16  1025  By signing my name below, I, Sonum Patel, attest that this documentation has been prepared under the direction and in the presence of Virgel Manifold, MD. Electronically Signed: Sonum Patel, Education administrator. 09/29/16. 1:59 PM.  History   Chief Complaint Chief Complaint  Patient presents with  . Chest Pain    The history is provided by the patient. No language interpreter was used.    HPI Comments: Cynthia Poole is a 61 y.o. female who presents to the Emergency Department complaining of several months of ongoing CP with exertion that worsened 2 days ago. She has associated dizziness and occasional diaphoresis. She notes walking several miles daily and states she has noticed this pain progress with each time she walks. She reports upper right back and right cramping abdominal pain that began over the last few days. She states the abdominal pain is constant but has gradually improved today. She also noticed a rash to the right mid back area which she believes to be shingles. She denies associated SOB or nausea. She denies a recent stress test. She has an appointment with her cardiologist Einar Gip) on 10/15/16.   Past Medical History:  Diagnosis Date  . Anemia   . DDD (degenerative disc disease), cervical   . DDD (degenerative disc disease), lumbar   . Depression   . Fibromyalgia 03/2004  . GERD (gastroesophageal reflux disease)   . Heart murmur   . History of pleurisy   . HLD (hyperlipidemia)   . Hodgkin's disease   . Hypothyroidism   . Insomnia   . Internal and external bleeding hemorrhoids   . Migraines   . MVP (mitral valve prolapse)   . Osteopenia   . Personal history of colonic adenoma 06/29/2011   06/29/2011 - diminutive adenoma  . Seasonal allergies   . Vitamin D deficiency     Patient Active Problem List   Diagnosis Date Noted  . Esophageal dysmotility 07/27/2014  . Personal history of  colonic adenoma 06/29/2011  . Lymphoma in remission ([place]) 06/14/2011    Class: Chronic  . S/P splenectomy 06/14/2011    Class: Chronic  . Mitral valve prolapse 06/14/2011    Class: Chronic  . Low back pain 06/14/2011    Class: Chronic  . Fibromyalgia 06/14/2011    Class: Chronic  . IBS (irritable bowel syndrome) 06/10/2011    Past Surgical History:  Procedure Laterality Date  . BUNIONECTOMY    . COLONOSCOPY  09/02/2007   redundant colon, external hemorrhoids  . COLONOSCOPY  06/29/2011   diminutive polyp, diverticulosis, external hemorrhoids  . DEEP NECK LYMPH NODE BIOPSY / EXCISION  1996   right LN   . ESOPHAGOGASTRODUODENOSCOPY  06/29/2011   notrmal, 54 Fr dilation (dysphagia)  . SPLENECTOMY, TOTAL  1996  . STRABISMUS SURGERY  02/2009   left  . TONSILLECTOMY  1965    OB History    No data available       Home Medications    Prior to Admission medications   Medication Sig Start Date End Date Taking? Authorizing Provider  ALPRAZolam Duanne Moron) 1 MG tablet Take 1 tablet by mouth at bedtime.  07/25/14  Yes Historical Provider, MD  aspirin-acetaminophen-caffeine (EXCEDRIN MIGRAINE) [id number] MG per tablet Take 1 tablet by mouth as needed.    Yes Historical Provider, MD  Cholecalciferol (VITAMIN D) 2000 UNITS CAPS Take 1 capsule by mouth daily.     Yes Historical Provider,  MD  estradiol (MINIVELLE) 0.05 MG/24HR patch Place 1 patch onto the skin 2 (two) times a week. Sunday & Wednesday   Yes Historical Provider, MD  Olopatadine HCl (PAZEO OP) Apply 1 drop to eye daily as needed. Itching; redness   Yes Historical Provider, MD  Probiotic Product (ALIGN) 4 MG CAPS Take 1 capsule by mouth daily.   Yes Historical Provider, MD  vitamin B-12 (CYANOCOBALAMIN) 1000 MCG tablet Take 1,000 mcg by mouth daily.   Yes Historical Provider, MD  atenolol (TENORMIN) 50 MG tablet Take 25 mg by mouth daily.  06/29/14   Historical Provider, MD  estradiol (VIVELLE-DOT) 0.05 MG/24HR Place 1 patch onto the  skin 2 (two) times a week.     Historical Provider, MD  magnesium 30 MG tablet Take 30 mg by mouth daily.    Historical Provider, MD  potassium chloride SA (K-DUR,KLOR-CON) 20 MEQ tablet Take 20 mEq by mouth daily.    Historical Provider, MD  SYNTHROID 25 MCG tablet Take 1 tablet by mouth daily. 06/06/14   Historical Provider, MD    Family History Family History  Problem Relation Age of Onset  . Heart disease Maternal Grandfather   . Heart disease Paternal Grandfather   . Heart disease      uncle  . Aneurysm Father     AAA  . Hypertension Father   . Tuberculosis Father   . Breast cancer Maternal Grandmother   . Breast cancer Maternal Aunt   . Arthritis Mother   . Breast cancer Sister     x 2   . Colon cancer Neg Hx     Social History Social History  Substance Use Topics  . Smoking status: Never Smoker  . Smokeless tobacco: Never Used  . Alcohol use 0.0 oz/week    2 - 3 Glasses of wine per week     Allergies   Zofran   Review of Systems Review of Systems  A complete review of systems was obtained and all systems are negative except as noted in the HPI and PMH.    Physical Exam Updated Vital Signs BP (!) 149/80   Pulse 77   Temp 97.4 F (36.3 C) (Oral)   Resp 16   Ht 5\' 6"  (1.676 m)   Wt 142 lb (64.4 kg)   SpO2 100%   BMI 22.92 kg/m   Physical Exam  Constitutional: She is oriented to person, place, and time. She appears well-developed and well-nourished. No distress.  HENT:  Head: Normocephalic and atraumatic.  Eyes: EOM are normal.  Neck: Normal range of motion.  Cardiovascular: Normal rate, regular rhythm and normal heart sounds.   Pulmonary/Chest: Effort normal and breath sounds normal.  Abdominal: Soft. She exhibits no distension. There is tenderness (mild epigastric ).  Musculoskeletal: Normal range of motion.  Neurological: She is alert and oriented to person, place, and time.  Skin: Skin is warm and dry. Rash noted. Rash is vesicular. There is  erythema.  Erythematous rash in a right T6 dermatome. Couple of areas of clustered vesicles consistent with shingles   Psychiatric: She has a normal mood and affect. Judgment normal.  Nursing note and vitals reviewed.    ED Treatments / Results  DIAGNOSTIC STUDIES: Oxygen Saturation is 98% on RA, normal by my interpretation.    COORDINATION OF CARE: 1:11 PM Discussed treatment plan with pt at bedside and pt agreed to plan.   Labs (all labs ordered are listed, but only abnormal results are displayed) Labs Reviewed  BASIC METABOLIC PANEL - Abnormal; Notable for the following:       Result Value   Glucose, Bld 168 (*)    All other components within normal limits  CBC  I-STAT TROPOININ, ED   RA EKG  EKG Interpretation  Date/Time:  Tuesday September 29 2016 10:30:52 EDT Ventricular Rate:  91 PR Interval:  136 QRS Duration: 96 QT Interval:  354 QTC Calculation: 435 R Axis:   60 Text Interpretation:  Normal sinus rhythm Left ventricular hypertrophy with repolarization abnormality Anterior infarct , age undetermined Abnormal ECG No old tracing to compare Confirmed by Joel Cowin  MD, [place] [phone]) on 09/29/2016 10:55:07 AM       Radiology Dg Chest 2 View  Result Date: 09/29/2016 CLINICAL DATA:  Mid chest pain, recently radiating to the back EXAM: CHEST  2 VIEW COMPARISON:  None. FINDINGS: No pneumonia or effusion is seen. However, on the frontal view there is opacity just above the aortic knob with some indistinctness of the aortic knob. This may all represent scarring but either comparison with prior or followup chest x-ray versus CT of the chest is recommended. Otherwise mediastinal and hilar contours are unremarkable. The heart is within normal limits in size. No acute bony abnormality is seen. Surgical clips are noted in left upper quadrant posteriorly. IMPRESSION: 1. No definite active pneumonia or effusion. 2. Vague opacity above the aortic knob medially may represent scarring but  recommend comparison with prior or followup chest x-ray. Electronically Signed   By: Ivar Drape M.D.   On: 09/29/2016 11:15    Procedures Procedures (including critical care time)  Medications Ordered in ED Medications  aspirin chewable tablet 324 mg (324 mg Oral Given 09/29/16 1337)  gi cocktail (Maalox,Lidocaine,Donnatal) (30 mLs Oral Given 09/29/16 1338)  predniSONE (DELTASONE) tablet 40 mg (40 mg Oral Given 09/29/16 1338)  valACYclovir (VALTREX) tablet 1,000 mg (1,000 mg Oral Given 09/29/16 1339)     Initial Impression / Assessment and Plan / ED Course  I have reviewed the triage vital signs and the nursing notes.  Pertinent labs & imaging results that were available during my care of the patient were reviewed by me and considered in my medical decision making (see chart for details).     [age]-year-old female with chest and back pain. She's been having exertional chest pain ongoing for months to about a year. She feels like it's been progressively worsening in terms of intensity and taking less effort to precipitate it. More recently in the past couple days she has been having upper back pain/upper abdominal pain as well. She does have have a rash in R mid thoracic region consistent with shingles. I suspect this new pain is secondary to singles.   I am concerned about her chest pain which seems like stable angina but progressing to unstable now. She does have an upcoming cardiology appointment but this is not until April 26. Her workup today the emergency room is fairly unremarkable. She does have an abnormal EKG but this appears to be repolarization abnormality secondary to LVH. I do not have an old one to compare it to.   1:34 PM Discussed with Dr Einar Gip, cardiology. I appreciate him coming to evaluate her.   Final Clinical Impressions(s) / ED Diagnoses   Final diagnoses:  None    New Prescriptions New Prescriptions   No medications on file   I personally preformed the  services scribed in my presence. The recorded information has been reviewed is accurate. Virgel Manifold,  MD.    Virgel Manifold, MD 10/08/16 1021

## 2016-09-29 NOTE — H&P (Signed)
Cynthia Poole is an 61 y.o. female.   Chief Complaint: Chest pain and abdominal pain HPI: Cynthia Poole  is a 61 y.o. female  With history of hyperlipidemia, Hodgkin's disease and history of radiation therapy to the chest in the remote past, irritable bowel syndrome, fibromyalgia, who was initially referred to me in the outpatient basis to be seen for exertional chest pain that has been ongoing for the past 1 year. Over the past 1 month, patient has noticed that every time she walks uphill, she was having chest tightness. She also noticed mild dyspnea on exertion. She walks on a daily basis for at least 5-6 days a week and walks for about 2 months and had noticed that with each exertional activity she was having mild chest discomfort which was relieved with rest. In the past 1-2 weeks, even minimal activity has started to bring on severe chest discomfort. Yesterday and today she started having chest discomfort but also started noticing severe abdominal pain and back pain and due to persistent symptoms she presented to the emergency room. She states that the belly pain has improved since being in the hospital, she still complains of some back pain and chest pain. She has noticed some lesions on the right side of her chest and thinks that she may have herpes.  No fever, no chills, no nausea or vomiting. She also states that she is chronically constipated, she does not recollect when she has last had a bowel movement. She is accompanied by her friend at the bedside.  Past Medical History:  Diagnosis Date  . Anemia   . DDD (degenerative disc disease), cervical   . DDD (degenerative disc disease), lumbar   . Depression   . Fibromyalgia 03/2004  . GERD (gastroesophageal reflux disease)   . Heart murmur   . History of pleurisy   . HLD (hyperlipidemia)   . Hodgkin's disease   . Hypothyroidism   . Insomnia   . Internal and external bleeding hemorrhoids   . Migraines   . MVP (mitral valve  prolapse)   . Osteopenia   . Personal history of colonic adenoma 06/29/2011   06/29/2011 - diminutive adenoma  . Seasonal allergies   . Vitamin D deficiency     Past Surgical History:  Procedure Laterality Date  . BUNIONECTOMY    . COLONOSCOPY  09/02/2007   redundant colon, external hemorrhoids  . COLONOSCOPY  06/29/2011   diminutive polyp, diverticulosis, external hemorrhoids  . DEEP NECK LYMPH NODE BIOPSY / EXCISION  1996   right LN   . ESOPHAGOGASTRODUODENOSCOPY  06/29/2011   notrmal, 54 Fr dilation (dysphagia)  . SPLENECTOMY, TOTAL  1996  . STRABISMUS SURGERY  02/2009   left  . TONSILLECTOMY  1965    Family History  Problem Relation Age of Onset  . Heart disease Maternal Grandfather   . Heart disease Paternal Grandfather   . Heart disease      uncle  . Aneurysm Father     AAA  . Hypertension Father   . Tuberculosis Father   . Breast cancer Maternal Grandmother   . Breast cancer Maternal Aunt   . Arthritis Mother   . Breast cancer Sister     x 2   . Colon cancer Neg Hx    Social History:  reports that she has never smoked. She has never used smokeless tobacco. She reports that she drinks alcohol. She reports that she does not use drugs.  Allergies:  Allergies  Allergen Reactions  . Zofran     zofran causes drop in blood pressure    Review of Systems - Abdominal discomfort for the last 2 days, however she does have chronic constipation. History of IBS. No melena. Has been told to have hypothyroidism but is not on medication for the past 1 year or greater. Has constant headaches. No visual disturbances, no neurologic deficits. No recent weight changes. No other symptoms associated with chest pain, complains of back pain. Right upper back. No leg edema, no painful sling of the lower extremities. Other systems negative.  Blood pressure (!) 131/57, pulse 88, temperature 97.4 F (36.3 C), temperature source Oral, resp. rate 12, height _0  (1.676 m), weight 64.4 kg (142 lb),  SpO2 100 %. General appearance: alert, cooperative, appears stated age, no distress and appears  to be uncomfortable Eyes: negative findings: lids and lashes normal Neck: no adenopathy, no carotid bruit, no JVD, supple, symmetrical, trachea midline and thyroid not enlarged, symmetric, no tenderness/mass/nodules Neck: JVP - normal, carotids 2+= without bruits Resp: clear to auscultation bilaterally Chest wall: no tenderness Cardio: regular rate and rhythm, S1, S2 normal, no murmur, click, rub or gallop GI: Abdomen soft, bowel sounds heard in all 4 quadrants, very minimal diffuse tenderness without guarding or rigidity. Extremities: extremities normal, atraumatic, no cyanosis or edema Pulses: 2+ and symmetric Skin: Skin color, texture, turgor normal. No rashes or lesions Neurologic: Grossly normal  Results for orders placed or performed during the hospital encounter of 09/29/16 (from the past 48 hour(s))  Basic metabolic panel     Status: Abnormal   Collection Time: 09/29/16 10:44 AM  Result Value Ref Range   Sodium 135 135 - 145 mmol/L   Potassium 3.9 3.5 - 5.1 mmol/L   Chloride 101 101 - 111 mmol/L   CO2 24 22 - 32 mmol/L   Glucose, Bld 168 (H) 65 - 99 mg/dL   BUN 9 6 - 20 mg/dL   Creatinine, Ser 0.85 0.44 - 1.00 mg/dL   Calcium 9.2 8.9 - 10.3 mg/dL   GFR calc non Af Amer >60 >60 mL/min   GFR calc Af Amer >60 >60 mL/min    Comment: (NOTE) The eGFR has been calculated using the CKD EPI equation. This calculation has not been validated in all clinical situations. eGFR's persistently <60 mL/min signify possible Chronic Kidney Disease.    Anion gap 10 5 - 15  CBC     Status: None   Collection Time: 09/29/16 10:44 AM  Result Value Ref Range   WBC 6.1 4.0 - 10.5 K/uL   RBC 4.01 3.87 - 5.11 MIL/uL   Hemoglobin 12.3 12.0 - 15.0 g/dL   HCT 36.7 36.0 - 46.0 %   MCV 91.5 78.0 - 100.0 fL   MCH 30.7 26.0 - 34.0 pg   MCHC 33.5 30.0 - 36.0 g/dL   RDW 13.6 11.5 - 15.5 %   Platelets 287  150 - 400 K/uL  I-stat troponin, ED     Status: None   Collection Time: 09/29/16 10:55 AM  Result Value Ref Range   Troponin i, poc 0.00 0.00 - 0.08 ng/mL   Comment 3            Comment: Due to the release kinetics of cTnI, a negative result within the first hours of the onset of symptoms does not rule out myocardial infarction with certainty. If myocardial infarction is still suspected, repeat the test at appropriate intervals.    Dg Chest 2  View  Result Date: 09/29/2016 CLINICAL DATA:  Mid chest pain, recently radiating to the back EXAM: CHEST  2 VIEW COMPARISON:  None. FINDINGS: No pneumonia or effusion is seen. However, on the frontal view there is opacity just above the aortic knob with some indistinctness of the aortic knob. This may all represent scarring but either comparison with prior or followup chest x-ray versus CT of the chest is recommended. Otherwise mediastinal and hilar contours are unremarkable. The heart is within normal limits in size. No acute bony abnormality is seen. Surgical clips are noted in left upper quadrant posteriorly. IMPRESSION: 1. No definite active pneumonia or effusion. 2. Vague opacity above the aortic knob medially may represent scarring but recommend comparison with prior or followup chest x-ray. Electronically Signed   By: Ivar Drape M.D.   On: 09/29/2016 11:15    Labs:   Lab Results  Component Value Date   WBC 6.1 09/29/2016   HGB 12.3 09/29/2016   HCT 36.7 09/29/2016   MCV 91.5 09/29/2016   PLT 287 09/29/2016    Recent Labs Lab 09/29/16 1044  NA 135  K 3.9  CL 101  CO2 24  BUN 9  CREATININE 0.85  CALCIUM 9.2  GLUCOSE 168*    Lipid Panel  No results found for: CHOL, TRIG, HDL, CHOLHDL, VLDL, LDLCALC  BNP (last 3 results) No results for input(s): BNP in the last 8760 hours.  HEMOGLOBIN A1C No results found for: HGBA1C, MPG  Cardiac Panel (last 3 results) No results for input(s): CKTOTAL, CKMB, TROPONINI, RELINDX in the last  8760 hours.  No results found for: CKTOTAL, CKMB, CKMBINDEX, TROPONINI   TSH No results for input(s): TSH in the last 8760 hours.  EKG: Normal sinus rhythm, normal axis, LVH with repolarization abnormality. Nonspecific T abnormality..   Current Facility-Administered Medications:  .  acetaminophen (TYLENOL) tablet 650 mg, 650 mg, Oral, Q4H PRN, Adrian Prows, MD .  aspirin chewable tablet 324 mg, 324 mg, Oral, NOW **OR** [DISCONTINUED] aspirin suppository 300 mg, 300 mg, Rectal, NOW, Adrian Prows, MD .  Derrill Memo ON 09/30/2016] aspirin EC tablet 81 mg, 81 mg, Oral, Daily, Adrian Prows, MD .  nitroGLYCERIN (NITROSTAT) SL tablet 0.4 mg, 0.4 mg, Sublingual, Q5 Min x 3 PRN, Adrian Prows, MD .  polyethylene glycol (MIRALAX / GLYCOLAX) packet 34 g, 34 g, Oral, Daily, Virgel Manifold, MD, 34 g at 09/29/16 1447  Current Outpatient Prescriptions:  .  ALPRAZolam (XANAX) 1 MG tablet, Take 1 tablet by mouth at bedtime. , Disp: , Rfl:  .  aspirin-acetaminophen-caffeine (EXCEDRIN MIGRAINE) 250-250-65 MG per tablet, Take 1 tablet by mouth as needed. , Disp: , Rfl:  .  Cholecalciferol (VITAMIN D) 2000 UNITS CAPS, Take 1 capsule by mouth daily.  , Disp: , Rfl:  .  estradiol (MINIVELLE) 0.05 MG/24HR patch, Place 1 patch onto the skin 2 (two) times a week. Sunday & Wednesday, Disp: , Rfl:  .  Olopatadine HCl (PAZEO OP), Apply 1 drop to eye daily as needed. Itching; redness, Disp: , Rfl:  .  Probiotic Product (ALIGN) 4 MG CAPS, Take 1 capsule by mouth daily., Disp: , Rfl:  .  vitamin B-12 (CYANOCOBALAMIN) 1000 MCG tablet, Take 1,000 mcg by mouth daily., Disp: , Rfl:  .  atenolol (TENORMIN) 50 MG tablet, Take 25 mg by mouth daily. , Disp: , Rfl:  .  estradiol (VIVELLE-DOT) 0.05 MG/24HR, Place 1 patch onto the skin 2 (two) times a week. , Disp: , Rfl:  .  magnesium 30 MG tablet, Take 30 mg by mouth daily., Disp: , Rfl:  .  potassium chloride SA (K-DUR,KLOR-CON) 20 MEQ tablet, Take 20 mEq by mouth daily., Disp: , Rfl:  .   SYNTHROID 25 MCG tablet, Take 1 tablet by mouth daily., Disp: , Rfl:   Assessment/Plan 1. Chest pain suggestive of unstable angina pectoris. Patient's symptoms are very suggestive of chronic angina pectoris which has progressed over the past 2-3 days with nearly rest angina pectoris. Cardiac marker, serum troponin point-of-care negative. 2. Back pain related to herpes zoster. Has active lesions that started yesterday. 3. Abdominal discomfort, patient has chronic constipation. No specific findings on physical exam, there is no white count, no guarding or rigidity. 4. Hyperlipidemia 5. Elevated blood pressure without diagnosis of hypertension, I suspect she probably has essential hypertension. Recommendation: Extremely difficult situation, patient has multiple presentation with abdominal discomfort probably related to constipation, back pain related to herpes zoster acute onset, also has symptoms suggestive of unstable angina pectoris. I will set her up for coronary angiography this evening depending on Lab schedule and my schedule.  I have discussed with the patient that compliance with medications is very essential especially if she does indeed need coronary stenting. She states that she is intolerant to many of the medications and does not like to take medications because of headache. But she is willing to take the medications as prescribed in the event she needs coronary stenting and is aware of risk associated with medication noncompliance. I will obtain routine labs. I do not think she has aortic dissection. Pulses are equal in both upper extremity is. I will obtain an echocardiogram also.  Discussed risks, benefits and alternatives of angiogram including but not limited to <1% risk of death, stroke, MI, need for urgent surgical revascularization, renal failure, but not limited to thest. patient is willing to proceed.   Adrian Prows, MD 09/29/2016, 2:56 PM [facility]. [place] Pager:  [phone] Office: [phone] If no answer: Cell:  [phone]

## 2016-09-29 NOTE — Progress Notes (Signed)
ANTICOAGULATION CONSULT NOTE - Initial Consult  Pharmacy Consult for heparin Indication: chest pain/ACS  Allergies  Allergen Reactions  . Zofran     zofran causes drop in blood pressure    Patient Measurements: Height: 5\' 6"  (167.6 cm) Weight: 142 lb (64.4 kg) IBW/kg (Calculated) : 59.3 Heparin Dosing Weight: 64.4kg   Vital Signs: Temp: 97.4 F (36.3 C) (04/10 1034) Temp Source: Oral (04/10 1034) BP: 131/57 (04/10 1445) Pulse Rate: 88 (04/10 1445)  Labs:  Recent Labs  09/29/16 1044  HGB 12.3  HCT 36.7  PLT 287  CREATININE 0.85    Estimated Creatinine Clearance: 65.9 mL/min (by C-G formula based on SCr of 0.85 mg/dL).   Medical History: Past Medical History:  Diagnosis Date  . Anemia   . DDD (degenerative disc disease), cervical   . DDD (degenerative disc disease), lumbar   . Depression   . Fibromyalgia 03/2004  . GERD (gastroesophageal reflux disease)   . Heart murmur   . History of pleurisy   . HLD (hyperlipidemia)   . Hodgkin's disease   . Hypothyroidism   . Insomnia   . Internal and external bleeding hemorrhoids   . Migraines   . MVP (mitral valve prolapse)   . Osteopenia   . Personal history of colonic adenoma 06/29/2011   06/29/2011 - diminutive adenoma  . Seasonal allergies   . Vitamin D deficiency     Medications:  Infusions:  . heparin      Assessment: 27 yof presented to the ED with CP. Baseline CBC is WNL and she is not on anticoagulation PTA.   Goal of Therapy:  Heparin level 0.3-0.7 units/ml Monitor platelets by anticoagulation protocol: Yes   Plan:  Heparin bolus 4000 units IV x 1 Heparin gtt 800 units/hr Check a 6 hr heparin level  Daily heparin level and CBC  Farrell Pantaleo, Rande Lawman 09/29/2016,3:05 PM

## 2016-09-29 NOTE — Progress Notes (Signed)
CRITICAL VALUE ALERT  Critical value received:  Troponin 0.03  Date of notification:  09/29/16  Time of notification:  2010  Critical value read back:Yes.    Nurse who received alert:  Helayne Seminole, RN  MD notified (1st page):  Fransico Him  Time of first page:  2038  Responding MD:  Fransico Him   Time MD responded:  2039; no new orders.

## 2016-09-29 NOTE — ED Triage Notes (Signed)
Per Pt, Pt is coming from home with complaints of mid-center chest aching that has been intermittent for a year. Pt reports that a couple days ago, the pain started to radiate to her back and reports some abdominal cramping. Stated the chest pain has been intermittent with exercise for the past year, but this episode. Has continued.

## 2016-09-30 ENCOUNTER — Encounter (HOSPITAL_COMMUNITY): Payer: Self-pay | Admitting: Cardiology

## 2016-09-30 DIAGNOSIS — I200 Unstable angina: Secondary | ICD-10-CM | POA: Diagnosis not present

## 2016-09-30 LAB — BASIC METABOLIC PANEL
ANION GAP: 7 (ref 5–15)
BUN: 11 mg/dL (ref 6–20)
CALCIUM: 9.5 mg/dL (ref 8.9–10.3)
CO2: 26 mmol/L (ref 22–32)
Chloride: 103 mmol/L (ref 101–111)
Creatinine, Ser: 0.84 mg/dL (ref 0.44–1.00)
GLUCOSE: 109 mg/dL — AB (ref 65–99)
POTASSIUM: 5.4 mmol/L — AB (ref 3.5–5.1)
SODIUM: 136 mmol/L (ref 135–145)

## 2016-09-30 LAB — CBC
HEMATOCRIT: 33.8 % — AB (ref 36.0–46.0)
Hemoglobin: 11.4 g/dL — ABNORMAL LOW (ref 12.0–15.0)
MCH: 30.7 pg (ref 26.0–34.0)
MCHC: 33.7 g/dL (ref 30.0–36.0)
MCV: 91.1 fL (ref 78.0–100.0)
Platelets: 284 10*3/uL (ref 150–400)
RBC: 3.71 MIL/uL — AB (ref 3.87–5.11)
RDW: 13.6 % (ref 11.5–15.5)
WBC: 8.2 10*3/uL (ref 4.0–10.5)

## 2016-09-30 LAB — TROPONIN I: Troponin I: 0.03 ng/mL (ref <0.03)

## 2016-09-30 LAB — HIV ANTIBODY (ROUTINE TESTING W REFLEX): HIV Screen 4th Generation wRfx: NONREACTIVE

## 2016-09-30 MED ORDER — ATORVASTATIN CALCIUM 80 MG PO TABS: 80.0000 mg | ORAL_TABLET | Freq: Every day | ORAL | 1 refills | Status: DC

## 2016-09-30 MED ORDER — ASPIRIN 81 MG PO TBEC: 81.0000 mg | DELAYED_RELEASE_TABLET | Freq: Every day | ORAL | Status: AC

## 2016-09-30 MED ORDER — TICAGRELOR 90 MG PO TABS: 90.0000 mg | ORAL_TABLET | Freq: Two times a day (BID) | ORAL | 0 refills | Status: DC

## 2016-09-30 MED ORDER — NITROGLYCERIN 0.4 MG SL SUBL: 0.4000 mg | SUBLINGUAL_TABLET | SUBLINGUAL | 2 refills | Status: DC | PRN

## 2016-09-30 MED ORDER — VALACYCLOVIR HCL 1 G PO TABS: 1000.0000 mg | ORAL_TABLET | Freq: Three times a day (TID) | ORAL | 0 refills | Status: DC

## 2016-09-30 MED ORDER — PROPRANOLOL HCL ER 80 MG PO CP24: 80.0000 mg | ORAL_CAPSULE | Freq: Every day | ORAL | 3 refills | Status: DC

## 2016-09-30 NOTE — Progress Notes (Signed)
CARDIAC REHAB PHASE I   PRE:  Rate/Rhythm: 100 ST    BP: sitting 116/60    SaO2:   MODE:  Ambulation: 600 ft   POST:  Rate/Rhythm: 106 ST    BP: sitting 140/70     SaO2:   Pt c/o chest tightness with every step. Sts she can feel it moving through her body. With continued discussion, feel this is related to her shingles. She is having more pain from her shingles today. Long discussion. She understands the importance of Brilinta and ASA. CM to come by to discuss Brilinta card, etc. Pt is concerned about taking a betablocker as her BP is 116/60. Up with walking. Pt watches her diet and will increase her walking slowly. Will refer to [place]. Understands NTG. [id number]   Darrick Meigs CES, ACSM 09/30/2016 10:43 AM

## 2016-09-30 NOTE — Progress Notes (Signed)
TR BAND REMOVAL  LOCATION:    right radial  DEFLATED PER PROTOCOL:    Yes.    TIME BAND OFF / DRESSING APPLIED:    2345   SITE UPON ARRIVAL:    Level 0  SITE AFTER BAND REMOVAL:    Level 1  CIRCULATION SENSATION AND MOVEMENT:    Within Normal Limits   Yes.    COMMENTS:   Pt tolerated well post radial site teaching reinforced.

## 2016-09-30 NOTE — Discharge Summary (Signed)
Physician Discharge Summary  Patient ID: Cynthia Poole MRN: [id number] DOB/AGE: Oct 21, 1955 61 y.o.  Admit date: 09/29/2016 Discharge date: 09/30/2016  Primary Discharge Diagnosis 1.  Unstable angina pectoris. 2.  Coronary artery disease of the native vessel, successful angioplasty to dominant midcircumflex with RESOLUTE ONYX 4.0X18 drug eluting stent on 09/29/2016  Secondary Discharge Diagnosis 3.  Hyperlipidemia: Total cholesterol 225, triglycerides 88, HDL 71, LDL 126. 4.  Hypertension 5.  Chronic constipation 6.  Herpes zoster right chest. 7.  Elevated TSH. A1c 5.5%.  Significant Diagnostic Studies:  Coronary angiogram 09/29/16: Dynamic LV systolic function, EF 55%. MR not well visualized due to hand contrast injection but does not appear to be significant. Left dominant circulation. Mild disease in the ramus and LAD. Circumflex midsegment has a focal hazy 60% stenosis by angiogram, 90% stenosis by intravascular ultrasound. Successful stenting with 4.0 x 18 mm Onyx DES, proximal and postdilated with a 4.5 mm [place] balloon at 16 atmospheric pressure. Stenosis reduced from 90% to 0% angiographically. Post stenting IVUS not performed.  EKG 09/29/2016: Normal sinus rhythm at the rate of 70 bpm, LVH, no evidence of ischemia.  Compared to EKG performed in the emergency room same day inferior and lateral T-wave flattening and nonspecific minimal ST depression no longer present.  Hospital Course: Patient admitted via emergency room when she presented with symptoms suggestive of chronic stable angina for the past one year associated with shortness breath and chest tightness.  Worsening pain and chest pain for the past 2 days.  Due to ongoing chest discomfort, taken to the cardiac catheterization lab and underwent successful angioplasty.  The following morning she was felt stable for discharge.  No recurrence of chest pain.  Recommendations on discharge: Patient also had herpes zoster right chest,  hence was started on Valtrex for 7 days.  She will need dual antiplatelet therapy for at least 1 year.  She was also started on high-dose statins and beta blocker was added.  She'll be seen in the office in 2 weeks. Patient has discontinued taking her Synthroid, TSH was mildly elevated.  She'll follow-up with her PCP for the same.  Discharge Exam: Blood pressure 118/64, pulse 89, temperature 98.3 F (36.8 C), temperature source Oral, resp. rate 18, height 5\' 6"  (1.676 m), weight 66.6 kg (146 lb 12.8 oz), SpO2 94 %.    General appearance: alert, cooperative, appears stated age and no distress Resp: clear to auscultation bilaterally and herpetic lesions noted on the right chest. Cardio: regular rate and rhythm, S1, S2 normal, no murmur, click, rub or gallop GI: soft, non-tender; bowel sounds normal; no masses,  no organomegaly Extremities: extremities normal, atraumatic, no cyanosis or edema Pulses: 2+ and symmetric Neurologic: Grossly normal Labs:   Lab Results  Component Value Date   WBC 8.2 09/30/2016   HGB 11.4 (L) 09/30/2016   HCT 33.8 (L) 09/30/2016   MCV 91.1 09/30/2016   PLT 284 09/30/2016    Recent Labs Lab 09/30/16 0247  NA 136  K 5.4*  CL 103  CO2 26  BUN 11  CREATININE 0.84  CALCIUM 9.5  GLUCOSE 109*    Lipid Panel     Component Value Date/Time   CHOL 225 (H) 09/29/2016 1505   TRIG 88 09/29/2016 1505   HDL 71 09/29/2016 1505   CHOLHDL 3.2 09/29/2016 1505   VLDL 18 09/29/2016 1505   LDLCALC 136 (H) 09/29/2016 1505    Recent Labs  09/29/16 1515 09/29/16 1913 09/30/16 0247  TROPONINI <0.03  0.03* 0.03*    Recent Labs  09/29/16 1453  TSH 4.510*   Radiology: Dg Chest 2 View  Result Date: 09/29/2016 CLINICAL DATA:  Mid chest pain, recently radiating to the back EXAM: CHEST  2 VIEW COMPARISON:  None. FINDINGS: No pneumonia or effusion is seen. However, on the frontal view there is opacity just above the aortic knob with some indistinctness of the  aortic knob. This may all represent scarring but either comparison with prior or followup chest x-ray versus CT of the chest is recommended. Otherwise mediastinal and hilar contours are unremarkable. The heart is within normal limits in size. No acute bony abnormality is seen. Surgical clips are noted in left upper quadrant posteriorly. IMPRESSION: 1. No definite active pneumonia or effusion. 2. Vague opacity above the aortic knob medially may represent scarring but recommend comparison with prior or followup chest x-ray. Electronically Signed   By: Ivar Drape M.D.   On: 09/29/2016 11:15    FOLLOW UP PLANS AND APPOINTMENTS Discharge Instructions    Diet - low sodium heart healthy    Complete by:  As directed    Increase activity slowly    Complete by:  As directed      Allergies as of 09/30/2016      Reactions   Zofran    zofran causes drop in blood pressure      Medication List    STOP taking these medications   atenolol 50 MG tablet Commonly known as:  TENORMIN   potassium chloride SA 20 MEQ tablet Commonly known as:  K-DUR,KLOR-CON   SYNTHROID 25 MCG tablet Generic drug:  levothyroxine     TAKE these medications   ALIGN 4 MG Caps Take 1 capsule by mouth daily.   ALPRAZolam 1 MG tablet Commonly known as:  XANAX Take 1 tablet by mouth at bedtime.   aspirin 81 MG EC tablet Take 1 tablet (81 mg total) by mouth daily.   aspirin-acetaminophen-caffeine 250-250-65 MG tablet Commonly known as:  EXCEDRIN MIGRAINE Take 1 tablet by mouth as needed.   atorvastatin 80 MG tablet Commonly known as:  LIPITOR Take 1 tablet (80 mg total) by mouth daily at 6 PM.   MINIVELLE 0.05 MG/24HR patch Generic drug:  estradiol Place 1 patch onto the skin 2 (two) times a week. Sunday & Wednesday   estradiol 0.05 MG/24HR patch Commonly known as:  VIVELLE-DOT Place 1 patch onto the skin 2 (two) times a week.   magnesium 30 MG tablet Take 30 mg by mouth daily.   nitroGLYCERIN 0.4 MG SL  tablet Commonly known as:  NITROSTAT Place 1 tablet (0.4 mg total) under the tongue every 5 (five) minutes x 3 doses as needed for chest pain.   PAZEO OP Apply 1 drop to eye daily as needed. Itching; redness   propranolol ER 80 MG 24 hr capsule Commonly known as:  INDERAL LA Take 1 capsule (80 mg total) by mouth daily.   ticagrelor 90 MG Tabs tablet Commonly known as:  BRILINTA Take 1 tablet (90 mg total) by mouth 2 (two) times daily.   valACYclovir 1000 MG tablet Commonly known as:  VALTREX Take 1 tablet (1,000 mg total) by mouth 3 (three) times daily.   vitamin B-12 1000 MCG tablet Commonly known as:  CYANOCOBALAMIN Take 1,000 mcg by mouth daily.   Vitamin D 2000 units Caps Take 1 capsule by mouth daily.      Follow-up Information    Adrian Prows, MD. Go on 10/15/2016.  Specialty:  Cardiology Why:  Keep 26th April appointment Contact information: [address] [place] [address] [phone]          Adrian Prows, MD 09/30/2016, 9:10 AM  Pager: [phone] Office: [phone] If no answer: [phone]

## 2016-09-30 NOTE — Progress Notes (Signed)
Patient transferred to [place]. Patient oriented to room. CCMD notified. Will continue to monitor patient.

## 2016-09-30 NOTE — Progress Notes (Signed)
Patient discharged to home with all her belongings.  Daughter here to pick up.  Escorted to the front entrance by staff via wheelchair.  No complaints voiced at the time of discharge. Marcille Blanco, RN

## 2016-09-30 NOTE — Progress Notes (Signed)
/  Viona Gilmore TABITHA @ [employer] # [phone]    BRILINTA  90 MG BID   COVER- YES  CO-PAY- 100 % OF TOTAL COAST /DEDUCTIBLE NOT MET  PRIOR APPROVAL- NO   PHARMACY : [facility]

## 2016-09-30 NOTE — Care Management Note (Signed)
Case Management Note  Patient Details  Name: Odeth Bry MRN: [id number] Date of Birth: 25-Dec-1955  Subjective/Objective:    s/p coronary stent intervention, will be on brilinta, NCM awaiting benefit check.   Patient is for dc today, NCM gave her the 30 day brilinta card, also she states it does not matter what her co pay is she will pay for it because she has to take the medicine.  NCM informed her that once find out what co pay is will let her know, also the [facility] on [address] has it in [place].   NCM informed patient that her deductible has not been met so co pay is 100% of cost.   /W TABITHA @ [employer] # [phone]    BRILINTA  90 MG BID   COVER- YES  CO-PAY- 100 % OF TOTAL COAST /DEDUCTIBLE NOT MET  PRIOR APPROVAL- NO   PHARMACY : [facility]                   Action/Plan:   Expected Discharge Date:  09/30/16               Expected Discharge Plan:  Home/Self Care  In-House Referral:     Discharge planning Services  CM Consult, Medication Assistance  Post Acute Care Choice:    Choice offered to:     DME Arranged:    DME Agency:     HH Arranged:    [place] Agency:     Status of Service:  Completed, signed off  If discussed at [employer], dates discussed:    Additional Comments:  Zenon Mayo, RN 09/30/2016, 12:22 PM

## 2016-10-01 LAB — HEMOGLOBIN A1C
Hgb A1c MFr Bld: 5.5 % (ref 4.8–5.6)
Mean Plasma Glucose: 111 mg/dL

## 2016-10-02 ENCOUNTER — Telehealth (HOSPITAL_COMMUNITY): Payer: Self-pay | Admitting: Internal Medicine

## 2016-10-02 DIAGNOSIS — R079 Chest pain, unspecified: Secondary | ICD-10-CM | POA: Diagnosis not present

## 2016-10-02 NOTE — Telephone Encounter (Signed)
Patient insurance is active and benefits verified. Patient has [employer] - no co-payment, deductible [phone] has been met, out of pocket $6650/$1888.86 has been met, no co-insurance, no pre-authorization and no limit on visits. Passport/reference [phone]. Information given to [facility] for review.

## 2016-10-09 DIAGNOSIS — I25119 Atherosclerotic heart disease of native coronary artery with unspecified angina pectoris: Secondary | ICD-10-CM | POA: Diagnosis not present

## 2016-10-09 DIAGNOSIS — E7800 Pure hypercholesterolemia, unspecified: Secondary | ICD-10-CM | POA: Diagnosis not present

## 2016-10-09 DIAGNOSIS — I10 Essential (primary) hypertension: Secondary | ICD-10-CM | POA: Diagnosis not present

## 2016-10-13 ENCOUNTER — Telehealth (HOSPITAL_COMMUNITY): Payer: Self-pay | Admitting: Internal Medicine

## 2016-10-13 NOTE — Telephone Encounter (Signed)
I called and spoke with patient about scheduling and participating in the Cardiac Rehab program. Patient stated she was interested in scheduling, but had people over and would call back later. Patient given my contact information to return call.

## 2016-10-16 ENCOUNTER — Telehealth (HOSPITAL_COMMUNITY): Payer: Self-pay | Admitting: Internal Medicine

## 2016-10-16 NOTE — Telephone Encounter (Signed)
I returned patient call and left message on voicemail to call office. I left my contact information on patient voicemail.

## 2016-10-19 DIAGNOSIS — F331 Major depressive disorder, recurrent, moderate: Secondary | ICD-10-CM | POA: Diagnosis not present

## 2016-10-21 DIAGNOSIS — I25119 Atherosclerotic heart disease of native coronary artery with unspecified angina pectoris: Secondary | ICD-10-CM | POA: Diagnosis not present

## 2016-10-21 DIAGNOSIS — I10 Essential (primary) hypertension: Secondary | ICD-10-CM | POA: Diagnosis not present

## 2016-10-23 ENCOUNTER — Other Ambulatory Visit: Payer: Self-pay | Admitting: Cardiology

## 2016-10-23 DIAGNOSIS — R079 Chest pain, unspecified: Secondary | ICD-10-CM

## 2016-10-23 DIAGNOSIS — R0789 Other chest pain: Secondary | ICD-10-CM | POA: Diagnosis not present

## 2016-10-23 DIAGNOSIS — I25119 Atherosclerotic heart disease of native coronary artery with unspecified angina pectoris: Secondary | ICD-10-CM

## 2016-10-23 DIAGNOSIS — R0602 Shortness of breath: Secondary | ICD-10-CM

## 2016-10-28 ENCOUNTER — Ambulatory Visit (HOSPITAL_COMMUNITY): Payer: BLUE CROSS/BLUE SHIELD

## 2016-10-28 ENCOUNTER — Ambulatory Visit (HOSPITAL_COMMUNITY)
Admission: RE | Admit: 2016-10-28 | Discharge: 2016-10-28 | Disposition: A | Discharge status: Home / Self Care | Payer: BLUE CROSS/BLUE SHIELD | Source: Ambulatory Visit | Attending: Cardiology | Admitting: Cardiology

## 2016-10-28 ENCOUNTER — Encounter (HOSPITAL_COMMUNITY): Payer: Self-pay

## 2016-10-30 DIAGNOSIS — F331 Major depressive disorder, recurrent, moderate: Secondary | ICD-10-CM | POA: Diagnosis not present

## 2016-11-02 DIAGNOSIS — E785 Hyperlipidemia, unspecified: Secondary | ICD-10-CM | POA: Insufficient documentation

## 2016-11-02 DIAGNOSIS — I25118 Atherosclerotic heart disease of native coronary artery with other forms of angina pectoris: Secondary | ICD-10-CM | POA: Insufficient documentation

## 2016-11-05 DIAGNOSIS — Z136 Encounter for screening for cardiovascular disorders: Secondary | ICD-10-CM | POA: Diagnosis not present

## 2016-11-05 DIAGNOSIS — I259 Chronic ischemic heart disease, unspecified: Secondary | ICD-10-CM | POA: Diagnosis not present

## 2016-11-05 DIAGNOSIS — E7800 Pure hypercholesterolemia, unspecified: Secondary | ICD-10-CM | POA: Diagnosis not present

## 2016-11-05 DIAGNOSIS — R079 Chest pain, unspecified: Secondary | ICD-10-CM | POA: Diagnosis not present

## 2016-11-05 DIAGNOSIS — Z955 Presence of coronary angioplasty implant and graft: Secondary | ICD-10-CM | POA: Diagnosis not present

## 2016-11-05 DIAGNOSIS — I2510 Atherosclerotic heart disease of native coronary artery without angina pectoris: Secondary | ICD-10-CM | POA: Diagnosis not present

## 2016-11-05 DIAGNOSIS — E785 Hyperlipidemia, unspecified: Secondary | ICD-10-CM | POA: Diagnosis not present

## 2016-11-05 DIAGNOSIS — R0602 Shortness of breath: Secondary | ICD-10-CM | POA: Diagnosis not present

## 2016-11-06 DIAGNOSIS — R079 Chest pain, unspecified: Secondary | ICD-10-CM | POA: Diagnosis not present

## 2016-11-06 DIAGNOSIS — Z136 Encounter for screening for cardiovascular disorders: Secondary | ICD-10-CM | POA: Diagnosis not present

## 2016-11-06 DIAGNOSIS — Z8571 Personal history of Hodgkin lymphoma: Secondary | ICD-10-CM | POA: Diagnosis not present

## 2016-11-06 DIAGNOSIS — I2510 Atherosclerotic heart disease of native coronary artery without angina pectoris: Secondary | ICD-10-CM | POA: Diagnosis not present

## 2016-11-06 DIAGNOSIS — R0602 Shortness of breath: Secondary | ICD-10-CM | POA: Diagnosis not present

## 2016-11-06 DIAGNOSIS — Z9861 Coronary angioplasty status: Secondary | ICD-10-CM | POA: Diagnosis not present

## 2016-11-09 ENCOUNTER — Telehealth (HOSPITAL_COMMUNITY): Payer: Self-pay

## 2016-11-09 NOTE — Telephone Encounter (Signed)
*  Updated insurance information* Patient has [employer] - no-co-payment, deductible [phone] has been met, out of pocket $6650/$6650 has been met, no co-insurance, no pre-authorization and no limit on visits. Passport/reference [phone].

## 2016-11-10 ENCOUNTER — Telehealth (HOSPITAL_COMMUNITY): Payer: Self-pay

## 2016-11-10 ENCOUNTER — Inpatient Hospital Stay (HOSPITAL_COMMUNITY): Admission: RE | Admit: 2016-11-10 | Payer: BLUE CROSS/BLUE SHIELD | Source: Ambulatory Visit

## 2016-11-10 NOTE — Telephone Encounter (Signed)
Cynthia Poole patient called and spoke with her.   Per patient, patient called and canceled orientation scheduled for today 11/10/16 @1 :30pm and asked that we cancel all cardiac rehab exercise classes. Per patient she will exercise on her own. Dr. Irven Shelling office notified by fax, patient canceled and does not want to be rescheduled. Referral canceled.

## 2016-11-12 DIAGNOSIS — F331 Major depressive disorder, recurrent, moderate: Secondary | ICD-10-CM | POA: Diagnosis not present

## 2016-11-18 ENCOUNTER — Ambulatory Visit (HOSPITAL_COMMUNITY): Payer: BLUE CROSS/BLUE SHIELD

## 2016-11-19 DIAGNOSIS — F331 Major depressive disorder, recurrent, moderate: Secondary | ICD-10-CM | POA: Diagnosis not present

## 2016-11-20 ENCOUNTER — Ambulatory Visit (HOSPITAL_COMMUNITY): Payer: BLUE CROSS/BLUE SHIELD

## 2016-11-23 ENCOUNTER — Ambulatory Visit (HOSPITAL_COMMUNITY): Payer: BLUE CROSS/BLUE SHIELD

## 2016-11-23 DIAGNOSIS — F331 Major depressive disorder, recurrent, moderate: Secondary | ICD-10-CM | POA: Diagnosis not present

## 2016-11-25 ENCOUNTER — Ambulatory Visit (HOSPITAL_COMMUNITY): Payer: BLUE CROSS/BLUE SHIELD

## 2016-11-27 ENCOUNTER — Ambulatory Visit (HOSPITAL_COMMUNITY): Payer: BLUE CROSS/BLUE SHIELD

## 2016-11-30 ENCOUNTER — Ambulatory Visit (HOSPITAL_COMMUNITY): Payer: BLUE CROSS/BLUE SHIELD

## 2016-12-02 ENCOUNTER — Ambulatory Visit (HOSPITAL_COMMUNITY): Payer: BLUE CROSS/BLUE SHIELD

## 2016-12-04 ENCOUNTER — Ambulatory Visit (HOSPITAL_COMMUNITY): Payer: BLUE CROSS/BLUE SHIELD

## 2016-12-07 ENCOUNTER — Ambulatory Visit (HOSPITAL_COMMUNITY): Payer: BLUE CROSS/BLUE SHIELD

## 2016-12-09 ENCOUNTER — Ambulatory Visit (HOSPITAL_COMMUNITY): Payer: BLUE CROSS/BLUE SHIELD

## 2016-12-10 DIAGNOSIS — F331 Major depressive disorder, recurrent, moderate: Secondary | ICD-10-CM | POA: Diagnosis not present

## 2016-12-10 DIAGNOSIS — I25119 Atherosclerotic heart disease of native coronary artery with unspecified angina pectoris: Secondary | ICD-10-CM | POA: Diagnosis not present

## 2016-12-10 DIAGNOSIS — E7800 Pure hypercholesterolemia, unspecified: Secondary | ICD-10-CM | POA: Diagnosis not present

## 2016-12-10 DIAGNOSIS — I700 Atherosclerosis of aorta: Secondary | ICD-10-CM | POA: Diagnosis not present

## 2016-12-11 ENCOUNTER — Ambulatory Visit (HOSPITAL_COMMUNITY): Payer: BLUE CROSS/BLUE SHIELD

## 2016-12-11 ENCOUNTER — Encounter: Payer: Self-pay | Admitting: Cardiology

## 2016-12-14 ENCOUNTER — Ambulatory Visit (HOSPITAL_COMMUNITY): Payer: BLUE CROSS/BLUE SHIELD

## 2016-12-16 ENCOUNTER — Ambulatory Visit (HOSPITAL_COMMUNITY): Payer: BLUE CROSS/BLUE SHIELD

## 2016-12-17 DIAGNOSIS — F331 Major depressive disorder, recurrent, moderate: Secondary | ICD-10-CM | POA: Diagnosis not present

## 2016-12-18 ENCOUNTER — Ambulatory Visit (HOSPITAL_COMMUNITY): Payer: BLUE CROSS/BLUE SHIELD

## 2016-12-21 ENCOUNTER — Ambulatory Visit (HOSPITAL_COMMUNITY): Payer: BLUE CROSS/BLUE SHIELD

## 2016-12-24 DIAGNOSIS — F331 Major depressive disorder, recurrent, moderate: Secondary | ICD-10-CM | POA: Diagnosis not present

## 2016-12-25 ENCOUNTER — Ambulatory Visit (HOSPITAL_COMMUNITY): Payer: BLUE CROSS/BLUE SHIELD

## 2016-12-28 ENCOUNTER — Ambulatory Visit (HOSPITAL_COMMUNITY): Payer: BLUE CROSS/BLUE SHIELD

## 2016-12-30 ENCOUNTER — Ambulatory Visit (HOSPITAL_COMMUNITY): Payer: BLUE CROSS/BLUE SHIELD

## 2016-12-31 DIAGNOSIS — F331 Major depressive disorder, recurrent, moderate: Secondary | ICD-10-CM | POA: Diagnosis not present

## 2017-01-01 ENCOUNTER — Ambulatory Visit (HOSPITAL_COMMUNITY): Payer: BLUE CROSS/BLUE SHIELD

## 2017-01-04 ENCOUNTER — Ambulatory Visit (HOSPITAL_COMMUNITY): Payer: BLUE CROSS/BLUE SHIELD

## 2017-01-04 DIAGNOSIS — F331 Major depressive disorder, recurrent, moderate: Secondary | ICD-10-CM | POA: Diagnosis not present

## 2017-01-06 ENCOUNTER — Ambulatory Visit (HOSPITAL_COMMUNITY): Payer: BLUE CROSS/BLUE SHIELD

## 2017-01-07 DIAGNOSIS — F331 Major depressive disorder, recurrent, moderate: Secondary | ICD-10-CM | POA: Diagnosis not present

## 2017-01-08 ENCOUNTER — Ambulatory Visit (HOSPITAL_COMMUNITY): Payer: BLUE CROSS/BLUE SHIELD

## 2017-01-11 ENCOUNTER — Ambulatory Visit (HOSPITAL_COMMUNITY): Payer: BLUE CROSS/BLUE SHIELD

## 2017-01-11 DIAGNOSIS — R0989 Other specified symptoms and signs involving the circulatory and respiratory systems: Secondary | ICD-10-CM | POA: Diagnosis not present

## 2017-01-11 DIAGNOSIS — E7800 Pure hypercholesterolemia, unspecified: Secondary | ICD-10-CM | POA: Diagnosis not present

## 2017-01-11 DIAGNOSIS — I700 Atherosclerosis of aorta: Secondary | ICD-10-CM | POA: Diagnosis not present

## 2017-01-11 DIAGNOSIS — I25119 Atherosclerotic heart disease of native coronary artery with unspecified angina pectoris: Secondary | ICD-10-CM | POA: Diagnosis not present

## 2017-01-13 ENCOUNTER — Ambulatory Visit (HOSPITAL_COMMUNITY): Payer: BLUE CROSS/BLUE SHIELD

## 2017-01-15 ENCOUNTER — Ambulatory Visit (HOSPITAL_COMMUNITY): Payer: BLUE CROSS/BLUE SHIELD

## 2017-01-18 ENCOUNTER — Ambulatory Visit (HOSPITAL_COMMUNITY): Payer: BLUE CROSS/BLUE SHIELD

## 2017-01-20 ENCOUNTER — Ambulatory Visit (HOSPITAL_COMMUNITY): Payer: BLUE CROSS/BLUE SHIELD

## 2017-01-22 ENCOUNTER — Ambulatory Visit (HOSPITAL_COMMUNITY): Payer: BLUE CROSS/BLUE SHIELD

## 2017-01-25 ENCOUNTER — Ambulatory Visit (HOSPITAL_COMMUNITY): Payer: BLUE CROSS/BLUE SHIELD

## 2017-01-25 DIAGNOSIS — Z6824 Body mass index (BMI) 24.0-24.9, adult: Secondary | ICD-10-CM | POA: Diagnosis not present

## 2017-01-25 DIAGNOSIS — Z1231 Encounter for screening mammogram for malignant neoplasm of breast: Secondary | ICD-10-CM | POA: Diagnosis not present

## 2017-01-25 DIAGNOSIS — Z0142 Encounter for cervical smear to confirm findings of recent normal smear following initial abnormal smear: Secondary | ICD-10-CM | POA: Diagnosis not present

## 2017-01-25 DIAGNOSIS — Z01419 Encounter for gynecological examination (general) (routine) without abnormal findings: Secondary | ICD-10-CM | POA: Diagnosis not present

## 2017-01-26 DIAGNOSIS — E784 Other hyperlipidemia: Secondary | ICD-10-CM | POA: Diagnosis not present

## 2017-01-26 DIAGNOSIS — Z9861 Coronary angioplasty status: Secondary | ICD-10-CM | POA: Diagnosis not present

## 2017-01-26 DIAGNOSIS — F418 Other specified anxiety disorders: Secondary | ICD-10-CM | POA: Diagnosis not present

## 2017-01-26 DIAGNOSIS — Z1389 Encounter for screening for other disorder: Secondary | ICD-10-CM | POA: Diagnosis not present

## 2017-01-26 DIAGNOSIS — M545 Low back pain: Secondary | ICD-10-CM | POA: Diagnosis not present

## 2017-01-27 ENCOUNTER — Ambulatory Visit (HOSPITAL_COMMUNITY): Payer: BLUE CROSS/BLUE SHIELD

## 2017-01-28 DIAGNOSIS — R011 Cardiac murmur, unspecified: Secondary | ICD-10-CM | POA: Diagnosis not present

## 2017-01-28 DIAGNOSIS — F331 Major depressive disorder, recurrent, moderate: Secondary | ICD-10-CM | POA: Diagnosis not present

## 2017-01-28 DIAGNOSIS — F332 Major depressive disorder, recurrent severe without psychotic features: Secondary | ICD-10-CM | POA: Diagnosis not present

## 2017-01-29 ENCOUNTER — Ambulatory Visit (HOSPITAL_COMMUNITY): Payer: BLUE CROSS/BLUE SHIELD

## 2017-02-01 ENCOUNTER — Ambulatory Visit (HOSPITAL_COMMUNITY): Payer: BLUE CROSS/BLUE SHIELD

## 2017-02-03 ENCOUNTER — Ambulatory Visit (HOSPITAL_COMMUNITY): Payer: BLUE CROSS/BLUE SHIELD

## 2017-02-05 ENCOUNTER — Telehealth (HOSPITAL_COMMUNITY): Payer: Self-pay

## 2017-02-05 ENCOUNTER — Ambulatory Visit (HOSPITAL_COMMUNITY): Payer: BLUE CROSS/BLUE SHIELD

## 2017-02-05 NOTE — Telephone Encounter (Signed)
I called patient to schedule cardiac rehab. Patient voicemail stated it was full, unable to leave message.

## 2017-02-08 ENCOUNTER — Ambulatory Visit (HOSPITAL_COMMUNITY): Payer: BLUE CROSS/BLUE SHIELD

## 2017-02-10 ENCOUNTER — Ambulatory Visit (HOSPITAL_COMMUNITY): Payer: BLUE CROSS/BLUE SHIELD

## 2017-02-11 ENCOUNTER — Telehealth (HOSPITAL_COMMUNITY): Payer: Self-pay

## 2017-02-11 DIAGNOSIS — F332 Major depressive disorder, recurrent severe without psychotic features: Secondary | ICD-10-CM | POA: Diagnosis not present

## 2017-02-11 DIAGNOSIS — F331 Major depressive disorder, recurrent, moderate: Secondary | ICD-10-CM | POA: Diagnosis not present

## 2017-02-12 ENCOUNTER — Ambulatory Visit (HOSPITAL_COMMUNITY): Payer: BLUE CROSS/BLUE SHIELD

## 2017-02-12 ENCOUNTER — Telehealth (HOSPITAL_COMMUNITY): Payer: Self-pay

## 2017-02-15 ENCOUNTER — Ambulatory Visit (HOSPITAL_COMMUNITY): Payer: BLUE CROSS/BLUE SHIELD

## 2017-02-15 NOTE — Telephone Encounter (Signed)
Cardiac Rehab Medication Review by a Pharmacist  Does the patient  feel that his/her medications are working for him/her?  yes  Has the patient been experiencing any side effects to the medications prescribed?  yes  Does the patient measure his/her own blood pressure or blood glucose at home?  yes   Does the patient have any problems obtaining medications due to transportation or finances?   no  Understanding of regimen: good Understanding of indications: good Potential of compliance: good    Pharmacist comments: Patient was able to tell me what she is taking. She stated she was switched from [place] to plavix. She has been experiencing headaches, but they have been for awhile so may not be do to medications. Her understanding and supposed compliance is good.    Cynthia Poole A Cynthia Poole 02/15/2017 1:07 PM

## 2017-02-17 ENCOUNTER — Ambulatory Visit (HOSPITAL_COMMUNITY): Payer: BLUE CROSS/BLUE SHIELD

## 2017-02-18 ENCOUNTER — Encounter (HOSPITAL_COMMUNITY): Payer: Self-pay

## 2017-02-18 ENCOUNTER — Telehealth (HOSPITAL_COMMUNITY): Payer: Self-pay

## 2017-02-18 ENCOUNTER — Encounter (HOSPITAL_COMMUNITY)
Admission: RE | Admit: 2017-02-18 | Discharge: 2017-02-18 | Disposition: A | Discharge status: Home / Self Care | Payer: BLUE CROSS/BLUE SHIELD | Source: Ambulatory Visit | Attending: Cardiology | Admitting: Cardiology

## 2017-02-18 VITALS — BP 148/80 | HR 78 | Ht 66.0 in | Wt 146.6 lb

## 2017-02-18 DIAGNOSIS — Z79899 Other long term (current) drug therapy: Secondary | ICD-10-CM | POA: Insufficient documentation

## 2017-02-18 DIAGNOSIS — K219 Gastro-esophageal reflux disease without esophagitis: Secondary | ICD-10-CM | POA: Diagnosis not present

## 2017-02-18 DIAGNOSIS — Z7902 Long term (current) use of antithrombotics/antiplatelets: Secondary | ICD-10-CM | POA: Insufficient documentation

## 2017-02-18 DIAGNOSIS — I10 Essential (primary) hypertension: Secondary | ICD-10-CM | POA: Diagnosis not present

## 2017-02-18 DIAGNOSIS — E785 Hyperlipidemia, unspecified: Secondary | ICD-10-CM | POA: Diagnosis not present

## 2017-02-18 DIAGNOSIS — E039 Hypothyroidism, unspecified: Secondary | ICD-10-CM | POA: Diagnosis not present

## 2017-02-18 DIAGNOSIS — Z955 Presence of coronary angioplasty implant and graft: Secondary | ICD-10-CM | POA: Insufficient documentation

## 2017-02-18 DIAGNOSIS — E559 Vitamin D deficiency, unspecified: Secondary | ICD-10-CM | POA: Diagnosis not present

## 2017-02-18 DIAGNOSIS — F332 Major depressive disorder, recurrent severe without psychotic features: Secondary | ICD-10-CM | POA: Diagnosis not present

## 2017-02-18 DIAGNOSIS — G4700 Insomnia, unspecified: Secondary | ICD-10-CM | POA: Diagnosis not present

## 2017-02-18 DIAGNOSIS — F331 Major depressive disorder, recurrent, moderate: Secondary | ICD-10-CM | POA: Diagnosis not present

## 2017-02-18 DIAGNOSIS — I2510 Atherosclerotic heart disease of native coronary artery without angina pectoris: Secondary | ICD-10-CM | POA: Insufficient documentation

## 2017-02-18 DIAGNOSIS — C8190 Hodgkin lymphoma, unspecified, unspecified site: Secondary | ICD-10-CM | POA: Insufficient documentation

## 2017-02-18 DIAGNOSIS — F329 Major depressive disorder, single episode, unspecified: Secondary | ICD-10-CM | POA: Diagnosis not present

## 2017-02-18 DIAGNOSIS — M797 Fibromyalgia: Secondary | ICD-10-CM | POA: Diagnosis not present

## 2017-02-18 DIAGNOSIS — Z7982 Long term (current) use of aspirin: Secondary | ICD-10-CM | POA: Insufficient documentation

## 2017-02-18 HISTORY — DX: Essential (primary) hypertension: I10

## 2017-02-18 NOTE — Progress Notes (Signed)
Cynthia Poole 61 y.o. female DOB: Nov 07, 1955 MRN: [id number]      Nutrition Note  1. 09/29/16 Stented coronary artery    Past Medical History:  Diagnosis Date  . Anemia   . CAD in native artery 09/29/2016   Coronary angiogram 1/70/01:VCBSWHQ LV systolic function, EF 75%. Left dominant circulation. Mild disease in the ramus and LAD. Circumflex midsegment  90% by IVUS S/P stenting 4.0 x 18 mm Onyx DES.  . DDD (degenerative disc disease), cervical   . DDD (degenerative disc disease), lumbar   . Depression   . Fibromyalgia 03/2004  . GERD (gastroesophageal reflux disease)   . Heart murmur   . History of pleurisy   . HLD (hyperlipidemia)   . Hodgkin's disease 1997    S/P Chemo, Chest RT and splenectomy  . Hypothyroidism   . Insomnia   . Internal and external bleeding hemorrhoids   . Migraines   . MVP (mitral valve prolapse)   . Osteopenia   . Personal history of colonic adenoma 06/29/2011   06/29/2011 - diminutive adenoma  . Seasonal allergies   . Vitamin D deficiency    Meds reviewed.   HT: Ht Readings from Last 1 Encounters:  09/29/16 5\' 6"  (1.676 m)    WT: Wt Readings from Last 3 Encounters:  09/30/16 146 lb 12.8 oz (66.6 kg)  07/27/14 140 lb 4 oz (63.6 kg)  07/03/14 142 lb 6.4 oz (64.6 kg)     BMI 23.7   Current tobacco use? No  Labs:  Lipid Panel     Component Value Date/Time   CHOL 225 (H) 09/29/2016 1505   TRIG 88 09/29/2016 1505   HDL 71 09/29/2016 1505   CHOLHDL 3.2 09/29/2016 1505   VLDL 18 09/29/2016 1505   LDLCALC 136 (H) 09/29/2016 1505    Lab Results  Component Value Date   HGBA1C 5.5 09/30/2016   CBG (last 3)  No results for input(s): GLUCAP in the last 72 hours.  Nutrition Note Spoke with pt. Nutrition plan and goals reviewed with pt. Pt is following Step 2 of the Therapeutic Lifestyle Changes diet. Pt wants to lose wt. Pt reports she has lost some wt by making a few changes in her diet (e.g. Not using as much olive oil, butter, or  cheese). Pt expressed understanding of the information reviewed. Pt aware of nutrition education classes offered.  Nutrition Diagnosis ? Food-and nutrition-related knowledge deficit related to lack of exposure to information as related to diagnosis of: ? CVD  Nutrition Intervention ? Pt's individual nutrition plan and goals reviewed with pt. ? Pt given handouts for: ? Nutrition I class ? Nutrition II class   Nutrition Goal(s):  ? Pt to identify food quantities necessary to achieve weight loss of 6-10 lb at graduation from cardiac rehab.  Plan:  Pt to attend nutrition classes ? Portion Distortion  Will provide client-centered nutrition education as part of interdisciplinary care.   Monitor and evaluate progress toward nutrition goal with team.  Derek Mound, M.Ed, RD, LDN, CDE 02/18/2017 10:57 AM

## 2017-02-18 NOTE — Progress Notes (Signed)
Cardiac Individual Treatment Plan  Patient Details  Name: Cynthia Poole MRN: [id number] Date of Birth: [birth year] Referring Provider:     CARDIAC REHAB PHASE II ORIENTATION from 02/18/2017 in [place]  Referring Provider  Kela Millin MD      Initial Encounter Date:    CARDIAC REHAB PHASE II ORIENTATION from 02/18/2017 in [facility]  Date  02/18/17  Referring Provider  Kela Millin MD      Visit Diagnosis: 09/29/16 Stented coronary artery - DES to CFX  Patient's Home Medications on Admission:  Current Outpatient Prescriptions:  .  ALPRAZolam (XANAX) 1 MG tablet, Take 1 tablet by mouth at bedtime. , Disp: , Rfl:  .  aspirin EC 81 MG EC tablet, Take 1 tablet (81 mg total) by mouth daily., Disp: , Rfl:  .  atorvastatin (LIPITOR) 80 MG tablet, Take 1 tablet (80 mg total) by mouth daily at 6 PM. (Patient taking differently: Take 40 mg by mouth daily at 6 PM. ), Disp: 30 tablet, Rfl: 1 .  Cholecalciferol (VITAMIN D) 2000 UNITS CAPS, Take 1 capsule by mouth daily.  , Disp: , Rfl:  .  clopidogrel (PLAVIX) 75 MG tablet, Take 75 mg by mouth daily., Disp: , Rfl:  .  estradiol (MINIVELLE) 0.05 MG/24HR patch, Place 1 patch onto the skin 2 (two) times a week. Sunday & Wednesday, Disp: , Rfl:  .  estradiol (VIVELLE-DOT) 0.05 MG/24HR, Place 1 patch onto the skin 2 (two) times a week. , Disp: , Rfl:  .  hydrOXYzine (ATARAX/VISTARIL) 25 MG tablet, Take 25 mg by mouth at bedtime., Disp: , Rfl:  .  magnesium 30 MG tablet, Take 30 mg by mouth daily., Disp: , Rfl:  .  nitroGLYCERIN (NITROSTAT) 0.4 MG SL tablet, Place 1 tablet (0.4 mg total) under the tongue every 5 (five) minutes x 3 doses as needed for chest pain., Disp: 25 tablet, Rfl: 2 .  propranolol ER (INDERAL LA) 80 MG 24 hr capsule, Take 1 capsule (80 mg total) by mouth daily. (Patient taking differently: Take 20 mg by mouth 2 (two) times daily. ), Disp: 30 capsule, Rfl:  3  Past Medical History: Past Medical History:  Diagnosis Date  . Anemia   . CAD in native artery 09/29/2016   Coronary angiogram 1/54/00:[id number] LV systolic function, EF 09%. Left dominant circulation. Mild disease in the ramus and LAD. Circumflex midsegment  90% by IVUS S/P stenting 4.0 x 18 mm Onyx DES.  . DDD (degenerative disc disease), cervical   . DDD (degenerative disc disease), lumbar   . Depression   . Fibromyalgia 03/2004  . GERD (gastroesophageal reflux disease)   . Heart murmur   . History of pleurisy   . HLD (hyperlipidemia)   . Hodgkin's disease 1997    S/P Chemo, Chest RT and splenectomy  . Hypertension   . Hypothyroidism   . Insomnia   . Internal and external bleeding hemorrhoids   . Migraines   . MVP (mitral valve prolapse)   . Osteopenia   . Personal history of colonic adenoma 06/29/2011   06/29/2011 - diminutive adenoma  . Seasonal allergies   . Vitamin D deficiency     Tobacco Use: History  Smoking Status  . Never Smoker  Smokeless Tobacco  . Never Used    Labs: Recent Review Flowsheet Data    Labs for ITP Cardiac and Pulmonary Rehab Latest Ref Rng & Units 09/29/2016 09/30/2016   Cholestrol  0 - 200 mg/dL 225(H) -   LDLCALC 0 - 99 mg/dL 136(H) -   HDL >40 mg/dL 71 -   Trlycerides <150 mg/dL 88 -   Hemoglobin A1c 4.8 - 5.6 % - 5.5      Capillary Blood Glucose: No results found for: GLUCAP   Exercise Target Goals: Date: 02/18/17  Exercise Program Goal: Individual exercise prescription set with THRR, safety & activity barriers. Participant demonstrates ability to understand and report RPE using BORG scale, to self-measure pulse accurately, and to acknowledge the importance of the exercise prescription.  Exercise Prescription Goal: Starting with aerobic activity 30 plus minutes a day, 3 days per week for initial exercise prescription. Provide home exercise prescription and guidelines that participant acknowledges understanding prior to  discharge.  Activity Barriers & Risk Stratification:     Activity Barriers & Cardiac Risk Stratification - 02/18/17 1246      Activity Barriers & Cardiac Risk Stratification   Activity Barriers None   Cardiac Risk Stratification High      6 Minute Walk:     6 Minute Walk    Row Name 02/18/17 1308         6 Minute Walk   Phase Initial     Distance 1882 feet     Walk Time 6 minutes     # of Rest Breaks 0     MPH 3.6     METS 5.1     RPE 12     VO2 Peak 17.72     Symptoms Yes (comment)     Comments balance concerns     Resting HR 78 bpm     Resting BP 148/80     Resting Oxygen Saturation  98 %     Exercise Oxygen Saturation  during 6 min walk 100 %     Max Ex. HR 115 bpm     Max Ex. BP 168/70     2 Minute Post BP 134/70        Oxygen Initial Assessment:   Oxygen Re-Evaluation:   Oxygen Discharge (Final Oxygen Re-Evaluation):   Initial Exercise Prescription:     Initial Exercise Prescription - 02/18/17 1300      Date of Initial Exercise RX and Referring Provider   Date 02/18/17   Referring Provider Kela Millin MD     Treadmill   MPH 3   Grade 1   Minutes 10   METs 3.71     Bike   Level 0.8   Minutes 10   METs 3.32     NuStep   Level 3   SPM 80   Minutes 10   METs 2.5     Arm Ergometer   Level 2   Watts 20   Minutes 10   METs 2.61     Prescription Details   Frequency (times per week) 3   Duration Progress to 30 minutes of continuous aerobic without signs/symptoms of physical distress     Intensity   THRR 40-80% of Max Heartrate 64-127   Ratings of Perceived Exertion 11-13   Perceived Dyspnea 0-4     Progression   Progression Continue to progress workloads to maintain intensity without signs/symptoms of physical distress.     Resistance Training   Training Prescription Yes   Weight 3lbs   Reps 10-15      Perform Capillary Blood Glucose checks as needed.  Exercise Prescription Changes:   Exercise  Comments:   Exercise Goals and Review:  Exercise Goals    Row Name 02/18/17 1319             Exercise Goals   Increase Physical Activity Yes       Intervention Provide advice, education, support and counseling about physical activity/exercise needs.;Develop an individualized exercise prescription for aerobic and resistive training based on initial evaluation findings, risk stratification, comorbidities and participant's personal goals.       Expected Outcomes Achievement of increased cardiorespiratory fitness and enhanced flexibility, muscular endurance and strength shown through measurements of functional capacity and personal statement of participant.       Increase Strength and Stamina Yes       Intervention Provide advice, education, support and counseling about physical activity/exercise needs.;Develop an individualized exercise prescription for aerobic and resistive training based on initial evaluation findings, risk stratification, comorbidities and participant's personal goals.       Expected Outcomes Achievement of increased cardiorespiratory fitness and enhanced flexibility, muscular endurance and strength shown through measurements of functional capacity and personal statement of participant.       Able to understand and use rate of perceived exertion (RPE) scale Yes       Intervention Provide education and explanation on how to use RPE scale       Expected Outcomes Short Term: Able to use RPE daily in rehab to express subjective intensity level;Long Term:  Able to use RPE to guide intensity level when exercising independently          Exercise Goals Re-Evaluation :    Discharge Exercise Prescription (Final Exercise Prescription Changes):   Nutrition:  Target Goals: Understanding of nutrition guidelines, daily intake of sodium 1500mg , cholesterol 200mg , calories 30% from fat and 7% or less from saturated fats, daily to have 5 or more servings of fruits and  vegetables.  Biometrics:     Pre Biometrics - 02/18/17 1408      Pre Biometrics   Height 5\' 6"  (1.676 m)   Weight 146 lb 9.7 oz (66.5 kg)   Waist Circumference 30 inches   Hip Circumference 40 inches   Waist to Hip Ratio 0.75 %   BMI (Calculated) 23.67   Triceps Skinfold 35 mm   % Body Fat 35.6 %   Grip Strength 32 kg   Flexibility 13.5 in   Single Leg Stand 16.09 seconds       Nutrition Therapy Plan and Nutrition Goals:     Nutrition Therapy & Goals - 02/18/17 1119      Nutrition Therapy   Diet Therapeutic Lifestyle Changes     Personal Nutrition Goals   Nutrition Goal Wt loss of 1-2 lb/week to a wt loss goal of 6-10 lb at graduation from [place], educate and counsel regarding individualized specific dietary modifications aiming towards targeted core components such as weight, hypertension, lipid management, diabetes, heart failure and other comorbidities.   Expected Outcomes Short Term Goal: Understand basic principles of dietary content, such as calories, fat, sodium, cholesterol and nutrients.;Long Term Goal: Adherence to prescribed nutrition plan.      Nutrition Discharge: Nutrition Scores:     Nutrition Assessments - 02/18/17 1119      MEDFICTS Scores   Pre Score 24      Nutrition Goals Re-Evaluation:   Nutrition Goals Re-Evaluation:   Nutrition Goals Discharge (Final Nutrition Goals Re-Evaluation):   Psychosocial: Target Goals: Acknowledge presence or absence of significant depression and/or stress, maximize coping skills, provide positive  support system. Participant is able to verbalize types and ability to use techniques and skills needed for reducing stress and depression.  Initial Review & Psychosocial Screening:     Initial Psych Review & Screening - 02/18/17 1239      Initial Review   Current issues with History of Depression     Family Dynamics   Good Support System? Yes    Comments --  Ethelyn is seperated from her husband currently     Barriers   Psychosocial barriers to participate in program The patient should benefit from training in stress management and relaxation.     Screening Interventions   Interventions Encouraged to exercise;To provide support and resources with identified psychosocial needs      Quality of Life Scores:     Quality of Life - 02/18/17 1319      Quality of Life Scores   Health/Function Pre 13.03 %   Socioeconomic Pre 18.86 %   Psych/Spiritual Pre 15.43 %   Family Pre 19.3 %   GLOBAL Pre 15.65 %      PHQ-9: Recent Review Flowsheet Data    There is no flowsheet data to display.     Interpretation of Total Score  Total Score Depression Severity:  1-4 = Minimal depression, 5-9 = Mild depression, 10-14 = Moderate depression, 15-19 = Moderately severe depression, 20-27 = Severe depression   Psychosocial Evaluation and Intervention:   Psychosocial Re-Evaluation:   Psychosocial Discharge (Final Psychosocial Re-Evaluation):   Vocational Rehabilitation: Provide vocational rehab assistance to qualifying candidates.   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 Participant will state understanding/return demonstration of topics presented.  Learning Barriers/Preferences:     Learning Barriers/Preferences - 02/18/17 1238      Learning Barriers/Preferences   Learning Barriers Sight  wears glasses   Learning Preferences Written Material;Pictoral      Education Topics: Count Your Pulse:  -Group instruction provided by verbal instruction, demonstration, patient participation  and written materials to support subject.  Instructors address importance of being able to find your pulse and how to count your pulse when at home without a heart monitor.  Patients get hands on experience counting their pulse with staff help and individually.   Heart Attack, Angina, and Risk Factor Modification:  -Group instruction provided by verbal instruction, video, and written materials to support subject.  Instructors address signs and symptoms of angina and heart attacks.    Also discuss risk factors for heart disease and how to make changes to improve heart health risk factors.   Functional Fitness:  -Group instruction provided by verbal instruction, demonstration, patient participation, and written materials to support subject.  Instructors address safety measures for doing things around the house.  Discuss how to get up and down off the floor, how to pick things up properly, how to safely get out of a chair without assistance, and balance training.   Meditation and Mindfulness:  -Group instruction provided by verbal instruction, patient participation, and written materials to support subject.  Instructor addresses importance of mindfulness and meditation practice to help reduce stress and improve awareness.  Instructor also leads participants through a  meditation exercise.    Stretching for Flexibility and Mobility:  -Group instruction provided by verbal instruction, patient participation, and written materials to support subject.  Instructors lead participants through series of stretches that are designed to increase flexibility thus improving mobility.  These stretches are additional exercise for major muscle groups that are typically performed during regular warm up and cool down.   Hands Only CPR:  -Group verbal, video, and participation provides a basic overview of AHA guidelines for community CPR. Role-play of emergencies allow participants the opportunity to practice calling  for help and chest compression technique with discussion of AED use.   Hypertension: -Group verbal and written instruction that provides a basic overview of hypertension including the most recent diagnostic guidelines, risk factor reduction with self-care instructions and medication management.    Nutrition I class: Heart Healthy Eating:  -Group instruction provided by PowerPoint slides, verbal discussion, and written materials to support subject matter. The instructor gives an explanation and review of the Therapeutic Lifestyle Changes diet recommendations, which includes a discussion on lipid goals, dietary fat, sodium, fiber, plant stanol/sterol esters, sugar, and the components of a well-balanced, healthy diet.   CARDIAC REHAB PHASE II ORIENTATION from 02/18/2017 in [place]  Date  02/18/17  Educator  RD  Instruction Review Code  Not applicable      Nutrition II class: Lifestyle Skills:  -Group instruction provided by PowerPoint slides, verbal discussion, and written materials to support subject matter. The instructor gives an explanation and review of label reading, grocery shopping for heart health, heart healthy recipe modifications, and ways to make healthier choices when eating out.   CARDIAC REHAB PHASE II ORIENTATION from 02/18/2017 in [place]  Date  02/18/17  Educator  RD  Instruction Review Code  Not applicable      Diabetes Question & Answer:  -Group instruction provided by PowerPoint slides, verbal discussion, and written materials to support subject matter. The instructor gives an explanation and review of diabetes co-morbidities, pre- and post-prandial blood glucose goals, pre-exercise blood glucose goals, signs, symptoms, and treatment of hypoglycemia and hyperglycemia, and foot care basics.   Diabetes Blitz:  -Group instruction provided by PowerPoint slides, verbal discussion, and written materials to  support subject matter. The instructor gives an explanation and review of the physiology behind type 1 and type 2 diabetes, diabetes medications and rational behind using different medications, pre- and post-prandial blood glucose recommendations and Hemoglobin A1c goals, diabetes diet, and exercise including blood glucose guidelines for exercising safely.    Portion Distortion:  -Group instruction provided by PowerPoint slides, verbal discussion, written materials, and food models to support subject matter. The instructor gives an explanation of serving size versus portion size, changes in portions sizes over the last 20 years, and what consists of a serving from each food group.   Stress Management:  -Group instruction provided by verbal instruction, video, and written materials to support subject matter.  Instructors review role of stress in heart disease and how to cope with stress positively.     Exercising on Your Own:  -Group instruction provided by verbal instruction, power point, and written materials to support subject.  Instructors discuss benefits of exercise, components of exercise, frequency and intensity of exercise, and end points for exercise.  Also discuss use of nitroglycerin and activating EMS.  Review options of places to exercise outside of rehab.  Review guidelines for sex with heart disease.   Cardiac Drugs I:  -  Group instruction provided by verbal instruction and written materials to support subject.  Instructor reviews cardiac drug classes: antiplatelets, anticoagulants, beta blockers, and statins.  Instructor discusses reasons, side effects, and lifestyle considerations for each drug class.   Cardiac Drugs II:  -Group instruction provided by verbal instruction and written materials to support subject.  Instructor reviews cardiac drug classes: angiotensin converting enzyme inhibitors (ACE-I), angiotensin II receptor blockers (ARBs), nitrates, and calcium channel  blockers.  Instructor discusses reasons, side effects, and lifestyle considerations for each drug class.   Anatomy and Physiology of the Circulatory System:  Group verbal and written instruction and models provide basic cardiac anatomy and physiology, with the coronary electrical and arterial systems. Review of: AMI, Angina, Valve disease, Heart Failure, Peripheral Artery Disease, Cardiac Arrhythmia, Pacemakers, and the ICD.   Other Education:  -Group or individual verbal, written, or video instructions that support the educational goals of the cardiac rehab program.   Knowledge Questionnaire Score:     Knowledge Questionnaire Score - 02/18/17 1308      Knowledge Questionnaire Score   Pre Score 21/24      Core Components/Risk Factors/Patient Goals at Admission:     Personal Goals and Risk Factors at Admission - 02/18/17 1237      Core Components/Risk Factors/Patient Goals on Admission   Hypertension Yes   Intervention Provide education on lifestyle modifcations including regular physical activity/exercise, weight management, moderate sodium restriction and increased consumption of fresh fruit, vegetables, and low fat dairy, alcohol moderation, and smoking cessation.;Monitor prescription use compliance.   Expected Outcomes Short Term: Continued assessment and intervention until BP is < 140/72mm HG in hypertensive participants. < 130/77mm HG in hypertensive participants with diabetes, heart failure or chronic kidney disease.;Long Term: Maintenance of blood pressure at goal levels.   Lipids Yes   Intervention Provide education and support for participant on nutrition & aerobic/resistive exercise along with prescribed medications to achieve LDL 70mg , HDL >40mg .   Expected Outcomes Short Term: Participant states understanding of desired cholesterol values and is compliant with medications prescribed. Participant is following exercise prescription and nutrition guidelines.;Long Term:  Cholesterol controlled with medications as prescribed, with individualized exercise RX and with personalized nutrition plan. Value goals: LDL < 70mg , HDL > 40 mg.   Stress Yes   Intervention Offer individual and/or small group education and counseling on adjustment to heart disease, stress management and health-related lifestyle change. Teach and support self-help strategies.;Refer participants experiencing significant psychosocial distress to appropriate mental health specialists for further evaluation and treatment. When possible, include family members and significant others in education/counseling sessions.   Expected Outcomes Short Term: Participant demonstrates changes in health-related behavior, relaxation and other stress management skills, ability to obtain effective social support, and compliance with psychotropic medications if prescribed.;Long Term: Emotional wellbeing is indicated by absence of clinically significant psychosocial distress or social isolation.      Core Components/Risk Factors/Patient Goals Review:    Core Components/Risk Factors/Patient Goals at Discharge (Final Review):    ITP Comments:     ITP Comments    Row Name 02/18/17 0844           ITP Comments Medical Director, Dr. Fransico Him          Comments: Carrye attended orientation from [facility] to 1200 to review rules and guidelines for program. Completed 6 minute walk test, Intitial ITP, and exercise prescription.  VSS. Telemetry-Sinus Rhythm with an exercise induced bundle branch block . Which resolved after rest.  Asymptomatic. Mattison says she  is depressed due to family, work and health stressors  and sees a therapist on a weekly basis. Emotional support offered.Will continue to monitor the patient throughout  the program. I offered Lida an appointment to see our hospital chaplain Jeanella Craze. Sharay declined at this time. Shantrice says she will let us know if she changes her mind at a later date and time.Barnet Pall,  RN,BSN 02/18/2017 2:48 PM

## 2017-02-18 NOTE — Progress Notes (Signed)
Cynthia Poole came to orientation and was noted to be in Sinus Rhythm rate in the 80's and developed a bundle branch block prior to starting her walk test. Today's ECG tracings with today's vital signs faxed to Dr Irven Shelling office for review. Dr Einar Gip reviewed today's ECG tracing and said that Cynthia Poole has a history of an exercise induced bundle branch block. Dr Einar Gip said that Cynthia Poole is okay to proceed with her walk test and exercise at cardiac rehab and has no restrictions with exercise.Barnet Pall, RN,BSN 02/18/2017 1:03 PM

## 2017-02-19 ENCOUNTER — Ambulatory Visit (HOSPITAL_COMMUNITY): Payer: BLUE CROSS/BLUE SHIELD

## 2017-02-24 ENCOUNTER — Encounter (HOSPITAL_COMMUNITY)
Admission: RE | Admit: 2017-02-24 | Discharge: 2017-02-24 | Disposition: A | Discharge status: Home / Self Care | Payer: BLUE CROSS/BLUE SHIELD | Source: Ambulatory Visit | Attending: Cardiology | Admitting: Cardiology

## 2017-02-24 DIAGNOSIS — M797 Fibromyalgia: Secondary | ICD-10-CM | POA: Diagnosis not present

## 2017-02-24 DIAGNOSIS — Z79899 Other long term (current) drug therapy: Secondary | ICD-10-CM | POA: Insufficient documentation

## 2017-02-24 DIAGNOSIS — Z955 Presence of coronary angioplasty implant and graft: Secondary | ICD-10-CM | POA: Diagnosis not present

## 2017-02-24 DIAGNOSIS — E039 Hypothyroidism, unspecified: Secondary | ICD-10-CM | POA: Diagnosis not present

## 2017-02-24 DIAGNOSIS — I10 Essential (primary) hypertension: Secondary | ICD-10-CM | POA: Diagnosis not present

## 2017-02-24 DIAGNOSIS — G4700 Insomnia, unspecified: Secondary | ICD-10-CM | POA: Diagnosis not present

## 2017-02-24 DIAGNOSIS — E559 Vitamin D deficiency, unspecified: Secondary | ICD-10-CM | POA: Diagnosis not present

## 2017-02-24 DIAGNOSIS — F329 Major depressive disorder, single episode, unspecified: Secondary | ICD-10-CM | POA: Diagnosis not present

## 2017-02-24 DIAGNOSIS — Z7982 Long term (current) use of aspirin: Secondary | ICD-10-CM | POA: Insufficient documentation

## 2017-02-24 DIAGNOSIS — I2510 Atherosclerotic heart disease of native coronary artery without angina pectoris: Secondary | ICD-10-CM | POA: Diagnosis not present

## 2017-02-24 DIAGNOSIS — K219 Gastro-esophageal reflux disease without esophagitis: Secondary | ICD-10-CM | POA: Insufficient documentation

## 2017-02-24 DIAGNOSIS — C8190 Hodgkin lymphoma, unspecified, unspecified site: Secondary | ICD-10-CM | POA: Diagnosis not present

## 2017-02-24 DIAGNOSIS — Z7902 Long term (current) use of antithrombotics/antiplatelets: Secondary | ICD-10-CM | POA: Diagnosis not present

## 2017-02-24 DIAGNOSIS — E785 Hyperlipidemia, unspecified: Secondary | ICD-10-CM | POA: Insufficient documentation

## 2017-02-24 NOTE — Telephone Encounter (Signed)
Encounter opened in error

## 2017-02-24 NOTE — Progress Notes (Signed)
QUALITY OF LIFE SCORE REVIEW  Pt completed Quality of Life survey as a participant in Cardiac Rehab. Scores 21.0 or below are considered low. Pt score very low in several areas Overall 15.65, Health and Function 13.03, socioeconomic 18.86, physiological and spiritual 15.43, family 19.30. Patient quality of life slightly altered by physical constraints which limits ability to perform as prior to recent cardiac illness.  Cynthia Poole has a lot of stressors in her life right now due to family issues.  Offered emotional support and reassurance.  Will continue to monitor and intervene as necessary.  Cynthia Poole sees a therapist on a weekly basis and says she sometimes feels depressed. Cynthia Poole has an appointment with her therapist tomorrow. Patient signed a medical release form and took a copy of her QOL questionnaire to review with her therapist Cynthia Poole.Barnet Pall, RN,BSN 02/24/2017 5:03 PM

## 2017-02-24 NOTE — Progress Notes (Signed)
Daily Session Note  Patient Details  Name: Cynthia Poole MRN: [id number] Date of Birth: [birth year] Referring Provider:     CARDIAC REHAB PHASE II ORIENTATION from 02/18/2017 in [place]  Referring Provider  Kela Millin MD      Encounter Date: 02/24/2017  Check In:     Session Check In - 02/24/17 1049      Check-In   Location [facility]   Staff Present Maurice Small, RN, Marga Melnick, RN, Deland Pretty, MS, ACSM CEP, Exercise Physiologist;Amber Fair, MS, ACSM RCEP, Exercise Physiologist   Supervising physician immediately available to respond to emergencies [facility] immediately available   Physician(s) Dr Bonner Puna   Medication changes reported     No   Fall or balance concerns reported    No   Tobacco Cessation No Change   Warm-up and Cool-down Performed as group-led instruction   Resistance Training Performed No   VAD Patient? No     Pain Assessment   Currently in Pain? No/denies      Capillary Blood Glucose: No results found for this or any previous visit (from the past 24 hour(s)).    History  Smoking Status  . Never Smoker  Smokeless Tobacco  . Never Used    Goals Met:  Exercise tolerated well  Goals Unmet:  Not Applicable  Comments: Pt started cardiac rehab today.  Pt tolerated light exercise without difficulty. VSS, telemetry-Sinus Rhythm with a exercise induced bundle branch block, asymptomatic.  Medication list reconciled. Pt denies barriers to medicaiton compliance.  PSYCHOSOCIAL ASSESSMENT:  PHQ-1. [name] has been experiencing some depression. [name] is going through a divorce and has some family stressors. Ayaat is receiving counseling on a weekly basis      Pt enjoys swimming, dancing and walking.   Pt oriented to exercise equipment and routine.    Understanding verbalized. Emotional support given to patient.Will continue to monitor the patient throughout  the program.[name]  Venetia Maxon, RN,BSN 02/24/2017 4:56 PM   Dr. Fransico Him is Medical Director for Cardiac Rehab at [facility].

## 2017-02-25 DIAGNOSIS — F331 Major depressive disorder, recurrent, moderate: Secondary | ICD-10-CM | POA: Diagnosis not present

## 2017-02-25 DIAGNOSIS — F332 Major depressive disorder, recurrent severe without psychotic features: Secondary | ICD-10-CM | POA: Diagnosis not present

## 2017-02-26 ENCOUNTER — Encounter (HOSPITAL_COMMUNITY): Payer: BLUE CROSS/BLUE SHIELD

## 2017-02-26 ENCOUNTER — Telehealth (HOSPITAL_COMMUNITY): Payer: Self-pay | Admitting: Internal Medicine

## 2017-03-01 ENCOUNTER — Encounter (HOSPITAL_COMMUNITY)
Admission: RE | Admit: 2017-03-01 | Discharge: 2017-03-01 | Disposition: A | Discharge status: Home / Self Care | Payer: BLUE CROSS/BLUE SHIELD | Source: Ambulatory Visit | Attending: Cardiology | Admitting: Cardiology

## 2017-03-01 DIAGNOSIS — Z955 Presence of coronary angioplasty implant and graft: Secondary | ICD-10-CM

## 2017-03-01 DIAGNOSIS — F329 Major depressive disorder, single episode, unspecified: Secondary | ICD-10-CM | POA: Diagnosis not present

## 2017-03-01 DIAGNOSIS — G4700 Insomnia, unspecified: Secondary | ICD-10-CM | POA: Diagnosis not present

## 2017-03-01 DIAGNOSIS — C8190 Hodgkin lymphoma, unspecified, unspecified site: Secondary | ICD-10-CM | POA: Diagnosis not present

## 2017-03-01 DIAGNOSIS — Z7902 Long term (current) use of antithrombotics/antiplatelets: Secondary | ICD-10-CM | POA: Diagnosis not present

## 2017-03-01 DIAGNOSIS — I2510 Atherosclerotic heart disease of native coronary artery without angina pectoris: Secondary | ICD-10-CM | POA: Diagnosis not present

## 2017-03-01 DIAGNOSIS — M797 Fibromyalgia: Secondary | ICD-10-CM | POA: Diagnosis not present

## 2017-03-01 DIAGNOSIS — K219 Gastro-esophageal reflux disease without esophagitis: Secondary | ICD-10-CM | POA: Diagnosis not present

## 2017-03-01 DIAGNOSIS — E559 Vitamin D deficiency, unspecified: Secondary | ICD-10-CM | POA: Diagnosis not present

## 2017-03-01 DIAGNOSIS — I10 Essential (primary) hypertension: Secondary | ICD-10-CM | POA: Diagnosis not present

## 2017-03-01 DIAGNOSIS — E039 Hypothyroidism, unspecified: Secondary | ICD-10-CM | POA: Diagnosis not present

## 2017-03-01 DIAGNOSIS — E785 Hyperlipidemia, unspecified: Secondary | ICD-10-CM | POA: Diagnosis not present

## 2017-03-01 DIAGNOSIS — Z7982 Long term (current) use of aspirin: Secondary | ICD-10-CM | POA: Diagnosis not present

## 2017-03-01 DIAGNOSIS — Z79899 Other long term (current) drug therapy: Secondary | ICD-10-CM | POA: Diagnosis not present

## 2017-03-02 DIAGNOSIS — F331 Major depressive disorder, recurrent, moderate: Secondary | ICD-10-CM | POA: Diagnosis not present

## 2017-03-02 DIAGNOSIS — F332 Major depressive disorder, recurrent severe without psychotic features: Secondary | ICD-10-CM | POA: Diagnosis not present

## 2017-03-03 ENCOUNTER — Encounter (HOSPITAL_COMMUNITY)
Admission: RE | Admit: 2017-03-03 | Discharge: 2017-03-03 | Disposition: A | Discharge status: Home / Self Care | Payer: BLUE CROSS/BLUE SHIELD | Source: Ambulatory Visit | Attending: Cardiology | Admitting: Cardiology

## 2017-03-03 DIAGNOSIS — Z7982 Long term (current) use of aspirin: Secondary | ICD-10-CM | POA: Diagnosis not present

## 2017-03-03 DIAGNOSIS — F329 Major depressive disorder, single episode, unspecified: Secondary | ICD-10-CM | POA: Diagnosis not present

## 2017-03-03 DIAGNOSIS — M797 Fibromyalgia: Secondary | ICD-10-CM | POA: Diagnosis not present

## 2017-03-03 DIAGNOSIS — I2510 Atherosclerotic heart disease of native coronary artery without angina pectoris: Secondary | ICD-10-CM | POA: Diagnosis not present

## 2017-03-03 DIAGNOSIS — E559 Vitamin D deficiency, unspecified: Secondary | ICD-10-CM | POA: Diagnosis not present

## 2017-03-03 DIAGNOSIS — Z955 Presence of coronary angioplasty implant and graft: Secondary | ICD-10-CM

## 2017-03-03 DIAGNOSIS — E039 Hypothyroidism, unspecified: Secondary | ICD-10-CM | POA: Diagnosis not present

## 2017-03-03 DIAGNOSIS — Z7902 Long term (current) use of antithrombotics/antiplatelets: Secondary | ICD-10-CM | POA: Diagnosis not present

## 2017-03-03 DIAGNOSIS — C8190 Hodgkin lymphoma, unspecified, unspecified site: Secondary | ICD-10-CM | POA: Diagnosis not present

## 2017-03-03 DIAGNOSIS — Z79899 Other long term (current) drug therapy: Secondary | ICD-10-CM | POA: Diagnosis not present

## 2017-03-03 DIAGNOSIS — I10 Essential (primary) hypertension: Secondary | ICD-10-CM | POA: Diagnosis not present

## 2017-03-03 DIAGNOSIS — G4700 Insomnia, unspecified: Secondary | ICD-10-CM | POA: Diagnosis not present

## 2017-03-03 DIAGNOSIS — K219 Gastro-esophageal reflux disease without esophagitis: Secondary | ICD-10-CM | POA: Diagnosis not present

## 2017-03-03 DIAGNOSIS — E785 Hyperlipidemia, unspecified: Secondary | ICD-10-CM | POA: Diagnosis not present

## 2017-03-04 DIAGNOSIS — F331 Major depressive disorder, recurrent, moderate: Secondary | ICD-10-CM | POA: Diagnosis not present

## 2017-03-04 DIAGNOSIS — F332 Major depressive disorder, recurrent severe without psychotic features: Secondary | ICD-10-CM | POA: Diagnosis not present

## 2017-03-04 NOTE — Progress Notes (Signed)
Cardiac Individual Treatment Plan  Patient Details  Name: Cynthia Poole MRN: [id number] Date of Birth: Dec 29, 1955 Referring Provider:     CARDIAC REHAB PHASE II ORIENTATION from 02/18/2017 in [place]  Referring Provider  Kela Millin MD      Initial Encounter Date:    CARDIAC REHAB PHASE II ORIENTATION from 02/18/2017 in [place]  Date  02/18/17  Referring Provider  Kela Millin MD      Visit Diagnosis: 09/29/16 Stented coronary artery  Patient's Home Medications on Admission:  Current Outpatient Prescriptions:  .  ALPRAZolam (XANAX) 1 MG tablet, Take 1 tablet by mouth at bedtime. , Disp: , Rfl:  .  aspirin EC 81 MG EC tablet, Take 1 tablet (81 mg total) by mouth daily., Disp: , Rfl:  .  atorvastatin (LIPITOR) 80 MG tablet, Take 1 tablet (80 mg total) by mouth daily at 6 PM. (Patient taking differently: Take 40 mg by mouth daily at 6 PM. ), Disp: 30 tablet, Rfl: 1 .  Cholecalciferol (VITAMIN D) 2000 UNITS CAPS, Take 1 capsule by mouth daily.  , Disp: , Rfl:  .  clopidogrel (PLAVIX) 75 MG tablet, Take 75 mg by mouth daily., Disp: , Rfl:  .  estradiol (MINIVELLE) 0.05 MG/24HR patch, Place 1 patch onto the skin 2 (two) times a week. Sunday & Wednesday, Disp: , Rfl:  .  estradiol (VIVELLE-DOT) 0.05 MG/24HR, Place 1 patch onto the skin 2 (two) times a week. , Disp: , Rfl:  .  hydrOXYzine (ATARAX/VISTARIL) 25 MG tablet, Take 25 mg by mouth at bedtime., Disp: , Rfl:  .  magnesium 30 MG tablet, Take 30 mg by mouth daily., Disp: , Rfl:  .  nitroGLYCERIN (NITROSTAT) 0.4 MG SL tablet, Place 1 tablet (0.4 mg total) under the tongue every 5 (five) minutes x 3 doses as needed for chest pain., Disp: 25 tablet, Rfl: 2 .  propranolol ER (INDERAL LA) 80 MG 24 hr capsule, Take 1 capsule (80 mg total) by mouth daily. (Patient taking differently: Take 20 mg by mouth 2 (two) times daily. ), Disp: 30 capsule, Rfl: 3  Past  Medical History: Past Medical History:  Diagnosis Date  . Anemia   . CAD in native artery 09/29/2016   Coronary angiogram 09/29/79:[id number] LV systolic function, EF 95%. Left dominant circulation. Mild disease in the ramus and LAD. Circumflex midsegment  90% by IVUS S/P stenting 4.0 x 18 mm Onyx DES.  . DDD (degenerative disc disease), cervical   . DDD (degenerative disc disease), lumbar   . Depression   . Fibromyalgia 03/2004  . GERD (gastroesophageal reflux disease)   . Heart murmur   . History of pleurisy   . HLD (hyperlipidemia)   . Hodgkin's disease 1997    S/P Chemo, Chest RT and splenectomy  . Hypertension   . Hypothyroidism   . Insomnia   . Internal and external bleeding hemorrhoids   . Migraines   . MVP (mitral valve prolapse)   . Osteopenia   . Personal history of colonic adenoma 06/29/2011   06/29/2011 - diminutive adenoma  . Seasonal allergies   . Vitamin D deficiency     Tobacco Use: History  Smoking Status  . Never Smoker  Smokeless Tobacco  . Never Used    Labs: Recent Review Flowsheet Data    Labs for ITP Cardiac and Pulmonary Rehab Latest Ref Rng & Units 09/29/2016 09/30/2016   Cholestrol 0 - 200 mg/dL  225(H) -   LDLCALC 0 - 99 mg/dL 136(H) -   HDL >40 mg/dL 71 -   Trlycerides <150 mg/dL 88 -   Hemoglobin A1c 4.8 - 5.6 % - 5.5      Capillary Blood Glucose: No results found for: GLUCAP   Exercise Target Goals:    Exercise Program Goal: Individual exercise prescription set with THRR, safety & activity barriers. Participant demonstrates ability to understand and report RPE using BORG scale, to self-measure pulse accurately, and to acknowledge the importance of the exercise prescription.  Exercise Prescription Goal: Starting with aerobic activity 30 plus minutes a day, 3 days per week for initial exercise prescription. Provide home exercise prescription and guidelines that participant acknowledges understanding prior to discharge.  Activity Barriers  & Risk Stratification:     Activity Barriers & Cardiac Risk Stratification - 02/18/17 1246      Activity Barriers & Cardiac Risk Stratification   Activity Barriers None   Cardiac Risk Stratification High      6 Minute Walk:     6 Minute Walk    Row Name 02/18/17 1308         6 Minute Walk   Phase Initial     Distance 1882 feet     Walk Time 6 minutes     # of Rest Breaks 0     MPH 3.6     METS 5.1     RPE 12     VO2 Peak 17.72     Symptoms Yes (comment)     Comments balance concerns     Resting HR 78 bpm     Resting BP 148/80     Resting Oxygen Saturation  98 %     Exercise Oxygen Saturation  during 6 min walk 100 %     Max Ex. HR 115 bpm     Max Ex. BP 168/70     2 Minute Post BP 134/70        Oxygen Initial Assessment:   Oxygen Re-Evaluation:   Oxygen Discharge (Final Oxygen Re-Evaluation):   Initial Exercise Prescription:     Initial Exercise Prescription - 02/18/17 1300      Date of Initial Exercise RX and Referring Provider   Date 02/18/17   Referring Provider Kela Millin MD     Treadmill   MPH 3   Grade 1   Minutes 10   METs 3.71     Bike   Level 0.8   Minutes 10   METs 3.32     NuStep   Level 3   SPM 80   Minutes 10   METs 2.5     Arm Ergometer   Level 2   Watts 20   Minutes 10   METs 2.61     Prescription Details   Frequency (times per week) 3   Duration Progress to 30 minutes of continuous aerobic without signs/symptoms of physical distress     Intensity   THRR 40-80% of Max Heartrate 64-127   Ratings of Perceived Exertion 11-13   Perceived Dyspnea 0-4     Progression   Progression Continue to progress workloads to maintain intensity without signs/symptoms of physical distress.     Resistance Training   Training Prescription Yes   Weight 3lbs   Reps 10-15      Perform Capillary Blood Glucose checks as needed.  Exercise Prescription Changes:     Exercise Prescription Changes    Row Name 03/02/17  1100  Response to Exercise   Blood Pressure (Admit) 118/72       Blood Pressure (Exercise) 118/62       Blood Pressure (Exit) 108/60       Heart Rate (Admit) 74 bpm       Heart Rate (Exercise) 116 bpm       Heart Rate (Exit) 73 bpm       Rating of Perceived Exertion (Exercise) 9       Symptoms none       Duration Continue with 30 min of aerobic exercise without signs/symptoms of physical distress.       Intensity THRR unchanged         Progression   Progression Continue to progress workloads to maintain intensity without signs/symptoms of physical distress.       Average METs 4.2         Resistance Training   Training Prescription No  No weights this session.       Weight 3lbs       Reps 10-15       Time 10 Minutes         Interval Training   Interval Training No         Treadmill   MPH 3       Grade 1       Minutes 10       METs 3.71         Bike   Level 1.7       Minutes 10       METs 5.85         NuStep   Level 3       SPM 80       Minutes 10       METs 3.1         Arm Ergometer   Level -       Watts -       Minutes -       METs -          Exercise Comments:     Exercise Comments    Row Name 03/03/17 1455           Exercise Comments Reviewed METs and goals with patient.          Exercise Goals and Review:     Exercise Goals    Row Name 02/18/17 1319             Exercise Goals   Increase Physical Activity Yes       Intervention Provide advice, education, support and counseling about physical activity/exercise needs.;Develop an individualized exercise prescription for aerobic and resistive training based on initial evaluation findings, risk stratification, comorbidities and participant's personal goals.       Expected Outcomes Achievement of increased cardiorespiratory fitness and enhanced flexibility, muscular endurance and strength shown through measurements of functional capacity and personal statement of participant.        Increase Strength and Stamina Yes       Intervention Provide advice, education, support and counseling about physical activity/exercise needs.;Develop an individualized exercise prescription for aerobic and resistive training based on initial evaluation findings, risk stratification, comorbidities and participant's personal goals.       Expected Outcomes Achievement of increased cardiorespiratory fitness and enhanced flexibility, muscular endurance and strength shown through measurements of functional capacity and personal statement of participant.       Able to understand and use rate of perceived exertion (RPE) scale Yes  Intervention Provide education and explanation on how to use RPE scale       Expected Outcomes Short Term: Able to use RPE daily in rehab to express subjective intensity level;Long Term:  Able to use RPE to guide intensity level when exercising independently          Exercise Goals Re-Evaluation :     Exercise Goals Re-Evaluation    Row Name 03/03/17 1455             Exercise Goal Re-Evaluation   Exercise Goals Review Increase Physical Activity;Increase Strength and Stamina       Comments Off to a good start with exercise. Pt has progressed to 4.2 METs.        Expected Outcomes Pt will begin home exercise program in addition to CR. Pt eventually wants to return to [place].           Discharge Exercise Prescription (Final Exercise Prescription Changes):     Exercise Prescription Changes - 03/02/17 1100      Response to Exercise   Blood Pressure (Admit) 118/72   Blood Pressure (Exercise) 118/62   Blood Pressure (Exit) 108/60   Heart Rate (Admit) 74 bpm   Heart Rate (Exercise) 116 bpm   Heart Rate (Exit) 73 bpm   Rating of Perceived Exertion (Exercise) 9   Symptoms none   Duration Continue with 30 min of aerobic exercise without signs/symptoms of physical distress.   Intensity THRR unchanged     Progression   Progression Continue to progress  workloads to maintain intensity without signs/symptoms of physical distress.   Average METs 4.2     Resistance Training   Training Prescription No  No weights this session.   Weight 3lbs   Reps 10-15   Time 10 Minutes     Interval Training   Interval Training No     Treadmill   MPH 3   Grade 1   Minutes 10   METs 3.71     Bike   Level 1.7   Minutes 10   METs 5.85     NuStep   Level 3   SPM 80   Minutes 10   METs 3.1     Arm Ergometer   Level --   Watts --   Minutes --   METs --      Nutrition:  Target Goals: Understanding of nutrition guidelines, daily intake of sodium 1500mg , cholesterol 200mg , calories 30% from fat and 7% or less from saturated fats, daily to have 5 or more servings of fruits and vegetables.  Biometrics:     Pre Biometrics - 02/18/17 1408      Pre Biometrics   Height 5\' 6"  (1.676 m)   Weight 146 lb 9.7 oz (66.5 kg)   Waist Circumference 30 inches   Hip Circumference 40 inches   Waist to Hip Ratio 0.75 %   BMI (Calculated) 23.67   Triceps Skinfold 35 mm   % Body Fat 35.6 %   Grip Strength 32 kg   Flexibility 13.5 in   Single Leg Stand 16.09 seconds       Nutrition Therapy Plan and Nutrition Goals:     Nutrition Therapy & Goals - 02/18/17 1119      Nutrition Therapy   Diet Therapeutic Lifestyle Changes     Personal Nutrition Goals   Nutrition Goal Wt loss of 1-2 lb/week to a wt loss goal of 6-10 lb at graduation from Cardiac Rehab     Intervention  Plan   Intervention Prescribe, educate and counsel regarding individualized specific dietary modifications aiming towards targeted core components such as weight, hypertension, lipid management, diabetes, heart failure and other comorbidities.   Expected Outcomes Short Term Goal: Understand basic principles of dietary content, such as calories, fat, sodium, cholesterol and nutrients.;Long Term Goal: Adherence to prescribed nutrition plan.      Nutrition Discharge: Nutrition  Scores:     Nutrition Assessments - 02/18/17 1119      MEDFICTS Scores   Pre Score 24      Nutrition Goals Re-Evaluation:   Nutrition Goals Re-Evaluation:   Nutrition Goals Discharge (Final Nutrition Goals Re-Evaluation):   Psychosocial: Target Goals: Acknowledge presence or absence of significant depression and/or stress, maximize coping skills, provide positive support system. Participant is able to verbalize types and ability to use techniques and skills needed for reducing stress and depression.  Initial Review & Psychosocial Screening:     Initial Psych Review & Screening - 02/18/17 1239      Initial Review   Current issues with History of Depression     Family Dynamics   Good Support System? Yes   Comments --  Zareth is seperated from her husband currently     Barriers   Psychosocial barriers to participate in program The patient should benefit from training in stress management and relaxation.     Screening Interventions   Interventions Encouraged to exercise;To provide support and resources with identified psychosocial needs      Quality of Life Scores:     Quality of Life - 02/24/17 1704      Quality of Life Scores   Health/Function Pre --  dissatisfied with health due to heart problems and sciatica   Socioeconomic Pre --  deines having any financial stressors but worries a lot   Family Pre --  concerned about one of her daughter's family life   GLOBAL Pre --  reivewed with the patient seeing a therapist ona frequent basis      PHQ-9: Recent Review Flowsheet Data    Depression screen PHQ 2/9 02/24/2017   Decreased Interest 0   Down, Depressed, Hopeless 1   PHQ - 2 Score 1     Interpretation of Total Score  Total Score Depression Severity:  1-4 = Minimal depression, 5-9 = Mild depression, 10-14 = Moderate depression, 15-19 = Moderately severe depression, 20-27 = Severe depression   Psychosocial Evaluation and Intervention:   Psychosocial  Re-Evaluation:   Psychosocial Discharge (Final Psychosocial Re-Evaluation):   Vocational Rehabilitation: Provide vocational rehab assistance to qualifying candidates.   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 Participant will state understanding/return demonstration of topics presented.  Learning Barriers/Preferences:     Learning Barriers/Preferences - 02/18/17 1238      Learning Barriers/Preferences   Learning Barriers Sight  wears glasses   Learning Preferences Written Material;Pictoral      Education Topics: Count Your Pulse:  -Group instruction provided by verbal instruction, demonstration, patient participation and written materials to support subject.  Instructors address importance of being able to find your pulse and how to count your pulse when at home without a  heart monitor.  Patients get hands on experience counting their pulse with staff help and individually.   Heart Attack, Angina, and Risk Factor Modification:  -Group instruction provided by verbal instruction, video, and written materials to support subject.  Instructors address signs and symptoms of angina and heart attacks.    Also discuss risk factors for heart disease and how to make changes to improve heart health risk factors.   Functional Fitness:  -Group instruction provided by verbal instruction, demonstration, patient participation, and written materials to support subject.  Instructors address safety measures for doing things around the house.  Discuss how to get up and down off the floor, how to pick things up properly, how to safely get out of a chair  without assistance, and balance training.   Meditation and Mindfulness:  -Group instruction provided by verbal instruction, patient participation, and written materials to support subject.  Instructor addresses importance of mindfulness and meditation practice to help reduce stress and improve awareness.  Instructor also leads participants through a meditation exercise.    Stretching for Flexibility and Mobility:  -Group instruction provided by verbal instruction, patient participation, and written materials to support subject.  Instructors lead participants through series of stretches that are designed to increase flexibility thus improving mobility.  These stretches are additional exercise for major muscle groups that are typically performed during regular warm up and cool down.   Hands Only CPR:  -Group verbal, video, and participation provides a basic overview of AHA guidelines for community CPR. Role-play of emergencies allow participants the opportunity to practice calling for help and chest compression technique with discussion of AED use.   Hypertension: -Group verbal and written instruction that provides a basic overview of hypertension including the most recent diagnostic guidelines, risk factor reduction with self-care instructions and medication management.    Nutrition I class: Heart Healthy Eating:  -Group instruction provided by PowerPoint slides, verbal discussion, and written materials to support subject matter. The instructor gives an explanation and review of the Therapeutic Lifestyle Changes diet recommendations, which includes a discussion on lipid goals, dietary fat, sodium, fiber, plant stanol/sterol esters, sugar, and the components of a well-balanced, healthy diet.   CARDIAC REHAB PHASE II ORIENTATION from 02/18/2017 in [place]  Date  02/18/17  Educator  RD  Instruction Review Code  Not applicable      Nutrition II class:  Lifestyle Skills:  -Group instruction provided by PowerPoint slides, verbal discussion, and written materials to support subject matter. The instructor gives an explanation and review of label reading, grocery shopping for heart health, heart healthy recipe modifications, and ways to make healthier choices when eating out.   CARDIAC REHAB PHASE II ORIENTATION from 02/18/2017 in [place]  Date  02/18/17  Educator  RD  Instruction Review Code  Not applicable      Diabetes Question & Answer:  -Group instruction provided by PowerPoint slides, verbal discussion, and written materials to support subject matter. The instructor gives an explanation and review of diabetes co-morbidities, pre- and post-prandial blood glucose goals, pre-exercise blood glucose goals, signs, symptoms, and treatment of hypoglycemia and hyperglycemia, and foot care basics.   Diabetes Blitz:  -Group instruction provided by PowerPoint slides, verbal discussion, and written materials to support subject matter. The instructor gives an explanation and review of the physiology behind type 1 and type 2 diabetes, diabetes medications and rational behind using different medications, pre- and post-prandial blood glucose recommendations and Hemoglobin  A1c goals, diabetes diet, and exercise including blood glucose guidelines for exercising safely.    Portion Distortion:  -Group instruction provided by PowerPoint slides, verbal discussion, written materials, and food models to support subject matter. The instructor gives an explanation of serving size versus portion size, changes in portions sizes over the last 20 years, and what consists of a serving from each food group.   Stress Management:  -Group instruction provided by verbal instruction, video, and written materials to support subject matter.  Instructors review role of stress in heart disease and how to cope with stress positively.      Exercising on Your Own:  -Group instruction provided by verbal instruction, power point, and written materials to support subject.  Instructors discuss benefits of exercise, components of exercise, frequency and intensity of exercise, and end points for exercise.  Also discuss use of nitroglycerin and activating EMS.  Review options of places to exercise outside of rehab.  Review guidelines for sex with heart disease.   Cardiac Drugs I:  -Group instruction provided by verbal instruction and written materials to support subject.  Instructor reviews cardiac drug classes: antiplatelets, anticoagulants, beta blockers, and statins.  Instructor discusses reasons, side effects, and lifestyle considerations for each drug class.   Cardiac Drugs II:  -Group instruction provided by verbal instruction and written materials to support subject.  Instructor reviews cardiac drug classes: angiotensin converting enzyme inhibitors (ACE-I), angiotensin II receptor blockers (ARBs), nitrates, and calcium channel blockers.  Instructor discusses reasons, side effects, and lifestyle considerations for each drug class.   Anatomy and Physiology of the Circulatory System:  Group verbal and written instruction and models provide basic cardiac anatomy and physiology, with the coronary electrical and arterial systems. Review of: AMI, Angina, Valve disease, Heart Failure, Peripheral Artery Disease, Cardiac Arrhythmia, Pacemakers, and the ICD.   Other Education:  -Group or individual verbal, written, or video instructions that support the educational goals of the cardiac rehab program.   Knowledge Questionnaire Score:     Knowledge Questionnaire Score - 02/18/17 1308      Knowledge Questionnaire Score   Pre Score 21/24      Core Components/Risk Factors/Patient Goals at Admission:     Personal Goals and Risk Factors at Admission - 02/18/17 1237      Core Components/Risk Factors/Patient Goals on Admission    Hypertension Yes   Intervention Provide education on lifestyle modifcations including regular physical activity/exercise, weight management, moderate sodium restriction and increased consumption of fresh fruit, vegetables, and low fat dairy, alcohol moderation, and smoking cessation.;Monitor prescription use compliance.   Expected Outcomes Short Term: Continued assessment and intervention until BP is < 140/46mm HG in hypertensive participants. < 130/69mm HG in hypertensive participants with diabetes, heart failure or chronic kidney disease.;Long Term: Maintenance of blood pressure at goal levels.   Lipids Yes   Intervention Provide education and support for participant on nutrition & aerobic/resistive exercise along with prescribed medications to achieve LDL 70mg , HDL >40mg .   Expected Outcomes Short Term: Participant states understanding of desired cholesterol values and is compliant with medications prescribed. Participant is following exercise prescription and nutrition guidelines.;Long Term: Cholesterol controlled with medications as prescribed, with individualized exercise RX and with personalized nutrition plan. Value goals: LDL < 70mg , HDL > 40 mg.   Stress Yes   Intervention Offer individual and/or small group education and counseling on adjustment to heart disease, stress management and health-related lifestyle change. Teach and support self-help strategies.;Refer participants experiencing significant psychosocial distress to appropriate  mental health specialists for further evaluation and treatment. When possible, include family members and significant others in education/counseling sessions.   Expected Outcomes Short Term: Participant demonstrates changes in health-related behavior, relaxation and other stress management skills, ability to obtain effective social support, and compliance with psychotropic medications if prescribed.;Long Term: Emotional wellbeing is indicated by absence of  clinically significant psychosocial distress or social isolation.      Core Components/Risk Factors/Patient Goals Review:    Core Components/Risk Factors/Patient Goals at Discharge (Final Review):    ITP Comments:     ITP Comments    Row Name 02/18/17 0844           ITP Comments Medical Director, Dr. Fransico Him          Comments: Cerissa is doing well with exercise after completing 4 exercise sessions.Emmalyn is off to a good start. Barnet Pall, RN,BSN 03/04/2017 9:52 AM

## 2017-03-05 ENCOUNTER — Encounter (HOSPITAL_COMMUNITY)
Admission: RE | Admit: 2017-03-05 | Discharge: 2017-03-05 | Disposition: A | Discharge status: Home / Self Care | Payer: BLUE CROSS/BLUE SHIELD | Source: Ambulatory Visit | Attending: Cardiology | Admitting: Cardiology

## 2017-03-05 DIAGNOSIS — I2510 Atherosclerotic heart disease of native coronary artery without angina pectoris: Secondary | ICD-10-CM | POA: Diagnosis not present

## 2017-03-05 DIAGNOSIS — I10 Essential (primary) hypertension: Secondary | ICD-10-CM | POA: Diagnosis not present

## 2017-03-05 DIAGNOSIS — Z7902 Long term (current) use of antithrombotics/antiplatelets: Secondary | ICD-10-CM | POA: Diagnosis not present

## 2017-03-05 DIAGNOSIS — E559 Vitamin D deficiency, unspecified: Secondary | ICD-10-CM | POA: Diagnosis not present

## 2017-03-05 DIAGNOSIS — K219 Gastro-esophageal reflux disease without esophagitis: Secondary | ICD-10-CM | POA: Diagnosis not present

## 2017-03-05 DIAGNOSIS — Z7982 Long term (current) use of aspirin: Secondary | ICD-10-CM | POA: Diagnosis not present

## 2017-03-05 DIAGNOSIS — F329 Major depressive disorder, single episode, unspecified: Secondary | ICD-10-CM | POA: Diagnosis not present

## 2017-03-05 DIAGNOSIS — M797 Fibromyalgia: Secondary | ICD-10-CM | POA: Diagnosis not present

## 2017-03-05 DIAGNOSIS — G4700 Insomnia, unspecified: Secondary | ICD-10-CM | POA: Diagnosis not present

## 2017-03-05 DIAGNOSIS — C8190 Hodgkin lymphoma, unspecified, unspecified site: Secondary | ICD-10-CM | POA: Diagnosis not present

## 2017-03-05 DIAGNOSIS — E039 Hypothyroidism, unspecified: Secondary | ICD-10-CM | POA: Diagnosis not present

## 2017-03-05 DIAGNOSIS — Z955 Presence of coronary angioplasty implant and graft: Secondary | ICD-10-CM

## 2017-03-05 DIAGNOSIS — E785 Hyperlipidemia, unspecified: Secondary | ICD-10-CM | POA: Diagnosis not present

## 2017-03-05 DIAGNOSIS — Z79899 Other long term (current) drug therapy: Secondary | ICD-10-CM | POA: Diagnosis not present

## 2017-03-05 NOTE — Progress Notes (Signed)
Reviewed home exercise guidelines with patient including endpoints, temperature precautions, target heart rate and rate of perceived exertion. Pt plans to walk as her mode of home exercise. Pt also plans to return to yoga, zumba and swimming eventually. Pt voices understanding of instructions given. Sol Passer, MS, ACSM CEP

## 2017-03-08 ENCOUNTER — Encounter (HOSPITAL_COMMUNITY): Payer: BLUE CROSS/BLUE SHIELD

## 2017-03-10 ENCOUNTER — Encounter (HOSPITAL_COMMUNITY)
Admission: RE | Admit: 2017-03-10 | Discharge: 2017-03-10 | Disposition: A | Discharge status: Home / Self Care | Payer: BLUE CROSS/BLUE SHIELD | Source: Ambulatory Visit | Attending: Cardiology | Admitting: Cardiology

## 2017-03-10 DIAGNOSIS — F329 Major depressive disorder, single episode, unspecified: Secondary | ICD-10-CM | POA: Diagnosis not present

## 2017-03-10 DIAGNOSIS — M797 Fibromyalgia: Secondary | ICD-10-CM | POA: Diagnosis not present

## 2017-03-10 DIAGNOSIS — I10 Essential (primary) hypertension: Secondary | ICD-10-CM | POA: Diagnosis not present

## 2017-03-10 DIAGNOSIS — E559 Vitamin D deficiency, unspecified: Secondary | ICD-10-CM | POA: Diagnosis not present

## 2017-03-10 DIAGNOSIS — G4700 Insomnia, unspecified: Secondary | ICD-10-CM | POA: Diagnosis not present

## 2017-03-10 DIAGNOSIS — E039 Hypothyroidism, unspecified: Secondary | ICD-10-CM | POA: Diagnosis not present

## 2017-03-10 DIAGNOSIS — C8190 Hodgkin lymphoma, unspecified, unspecified site: Secondary | ICD-10-CM | POA: Diagnosis not present

## 2017-03-10 DIAGNOSIS — Z955 Presence of coronary angioplasty implant and graft: Secondary | ICD-10-CM | POA: Diagnosis not present

## 2017-03-10 DIAGNOSIS — E785 Hyperlipidemia, unspecified: Secondary | ICD-10-CM | POA: Diagnosis not present

## 2017-03-10 DIAGNOSIS — K219 Gastro-esophageal reflux disease without esophagitis: Secondary | ICD-10-CM | POA: Diagnosis not present

## 2017-03-10 DIAGNOSIS — Z7902 Long term (current) use of antithrombotics/antiplatelets: Secondary | ICD-10-CM | POA: Diagnosis not present

## 2017-03-10 DIAGNOSIS — Z79899 Other long term (current) drug therapy: Secondary | ICD-10-CM | POA: Diagnosis not present

## 2017-03-10 DIAGNOSIS — Z7982 Long term (current) use of aspirin: Secondary | ICD-10-CM | POA: Diagnosis not present

## 2017-03-10 DIAGNOSIS — I2510 Atherosclerotic heart disease of native coronary artery without angina pectoris: Secondary | ICD-10-CM | POA: Diagnosis not present

## 2017-03-11 DIAGNOSIS — F332 Major depressive disorder, recurrent severe without psychotic features: Secondary | ICD-10-CM | POA: Diagnosis not present

## 2017-03-11 DIAGNOSIS — F331 Major depressive disorder, recurrent, moderate: Secondary | ICD-10-CM | POA: Diagnosis not present

## 2017-03-12 ENCOUNTER — Encounter (HOSPITAL_COMMUNITY)
Admission: RE | Admit: 2017-03-12 | Discharge: 2017-03-12 | Disposition: A | Discharge status: Home / Self Care | Payer: BLUE CROSS/BLUE SHIELD | Source: Ambulatory Visit | Attending: Cardiology | Admitting: Cardiology

## 2017-03-12 DIAGNOSIS — E039 Hypothyroidism, unspecified: Secondary | ICD-10-CM | POA: Diagnosis not present

## 2017-03-12 DIAGNOSIS — I2510 Atherosclerotic heart disease of native coronary artery without angina pectoris: Secondary | ICD-10-CM | POA: Diagnosis not present

## 2017-03-12 DIAGNOSIS — G4700 Insomnia, unspecified: Secondary | ICD-10-CM | POA: Diagnosis not present

## 2017-03-12 DIAGNOSIS — K219 Gastro-esophageal reflux disease without esophagitis: Secondary | ICD-10-CM | POA: Diagnosis not present

## 2017-03-12 DIAGNOSIS — I10 Essential (primary) hypertension: Secondary | ICD-10-CM | POA: Diagnosis not present

## 2017-03-12 DIAGNOSIS — Z7982 Long term (current) use of aspirin: Secondary | ICD-10-CM | POA: Diagnosis not present

## 2017-03-12 DIAGNOSIS — Z955 Presence of coronary angioplasty implant and graft: Secondary | ICD-10-CM | POA: Diagnosis not present

## 2017-03-12 DIAGNOSIS — C8190 Hodgkin lymphoma, unspecified, unspecified site: Secondary | ICD-10-CM | POA: Diagnosis not present

## 2017-03-12 DIAGNOSIS — Z7902 Long term (current) use of antithrombotics/antiplatelets: Secondary | ICD-10-CM | POA: Diagnosis not present

## 2017-03-12 DIAGNOSIS — Z79899 Other long term (current) drug therapy: Secondary | ICD-10-CM | POA: Diagnosis not present

## 2017-03-12 DIAGNOSIS — E559 Vitamin D deficiency, unspecified: Secondary | ICD-10-CM | POA: Diagnosis not present

## 2017-03-12 DIAGNOSIS — M797 Fibromyalgia: Secondary | ICD-10-CM | POA: Diagnosis not present

## 2017-03-12 DIAGNOSIS — E785 Hyperlipidemia, unspecified: Secondary | ICD-10-CM | POA: Diagnosis not present

## 2017-03-12 DIAGNOSIS — F329 Major depressive disorder, single episode, unspecified: Secondary | ICD-10-CM | POA: Diagnosis not present

## 2017-03-12 NOTE — Progress Notes (Signed)
Cynthia Poole 61 y.o. female DOB: May 18, 1956 MRN: [id number]      Nutrition Note  1. 09/29/16 Stented coronary artery    Nutrition Note Spoke with pt. Nutrition plan and survey reviewed with pt. Pt is following Step 2 of the Therapeutic Lifestyle Changes diet. Pt wants to lose wt. Pt states she knows what she needs to do to lose wt "I just have to do it." Pt expressed understanding of the information reviewed.   Nutrition Diagnosis ? Food-and nutrition-related knowledge deficit related to lack of exposure to information as related to diagnosis of: ? CVD  Nutrition Intervention ? Pt's individual nutrition plan and goals reviewed with pt. ? Benefits of adopting Therapeutic Lifestyle Changes discussed when [employer] reviewed.  Nutrition Goal(s):  ? Pt to identify food quantities necessary to achieve weight loss of 6-10 lb at graduation from cardiac rehab.  Plan:  Pt to attend nutrition classes ? Portion Distortion  Will provide client-centered nutrition education as part of interdisciplinary care.   Monitor and evaluate progress toward nutrition goal with team.  Derek Mound, M.Ed, RD, LDN, CDE 03/12/2017 10:44 AM

## 2017-03-15 ENCOUNTER — Encounter (HOSPITAL_COMMUNITY)
Admission: RE | Admit: 2017-03-15 | Discharge: 2017-03-15 | Disposition: A | Discharge status: Home / Self Care | Payer: BLUE CROSS/BLUE SHIELD | Source: Ambulatory Visit | Attending: Cardiology | Admitting: Cardiology

## 2017-03-15 DIAGNOSIS — C8190 Hodgkin lymphoma, unspecified, unspecified site: Secondary | ICD-10-CM | POA: Diagnosis not present

## 2017-03-15 DIAGNOSIS — Z955 Presence of coronary angioplasty implant and graft: Secondary | ICD-10-CM | POA: Diagnosis not present

## 2017-03-15 DIAGNOSIS — G4700 Insomnia, unspecified: Secondary | ICD-10-CM | POA: Diagnosis not present

## 2017-03-15 DIAGNOSIS — M797 Fibromyalgia: Secondary | ICD-10-CM | POA: Diagnosis not present

## 2017-03-15 DIAGNOSIS — Z7902 Long term (current) use of antithrombotics/antiplatelets: Secondary | ICD-10-CM | POA: Diagnosis not present

## 2017-03-15 DIAGNOSIS — Z79899 Other long term (current) drug therapy: Secondary | ICD-10-CM | POA: Diagnosis not present

## 2017-03-15 DIAGNOSIS — F329 Major depressive disorder, single episode, unspecified: Secondary | ICD-10-CM | POA: Diagnosis not present

## 2017-03-15 DIAGNOSIS — E785 Hyperlipidemia, unspecified: Secondary | ICD-10-CM | POA: Diagnosis not present

## 2017-03-15 DIAGNOSIS — I2510 Atherosclerotic heart disease of native coronary artery without angina pectoris: Secondary | ICD-10-CM | POA: Diagnosis not present

## 2017-03-15 DIAGNOSIS — Z7982 Long term (current) use of aspirin: Secondary | ICD-10-CM | POA: Diagnosis not present

## 2017-03-15 DIAGNOSIS — K219 Gastro-esophageal reflux disease without esophagitis: Secondary | ICD-10-CM | POA: Diagnosis not present

## 2017-03-15 DIAGNOSIS — E559 Vitamin D deficiency, unspecified: Secondary | ICD-10-CM | POA: Diagnosis not present

## 2017-03-15 DIAGNOSIS — E039 Hypothyroidism, unspecified: Secondary | ICD-10-CM | POA: Diagnosis not present

## 2017-03-15 DIAGNOSIS — I10 Essential (primary) hypertension: Secondary | ICD-10-CM | POA: Diagnosis not present

## 2017-03-17 ENCOUNTER — Encounter (HOSPITAL_COMMUNITY)
Admission: RE | Admit: 2017-03-17 | Discharge: 2017-03-17 | Disposition: A | Discharge status: Home / Self Care | Payer: BLUE CROSS/BLUE SHIELD | Source: Ambulatory Visit | Attending: Cardiology | Admitting: Cardiology

## 2017-03-17 DIAGNOSIS — I10 Essential (primary) hypertension: Secondary | ICD-10-CM | POA: Diagnosis not present

## 2017-03-17 DIAGNOSIS — C8190 Hodgkin lymphoma, unspecified, unspecified site: Secondary | ICD-10-CM | POA: Diagnosis not present

## 2017-03-17 DIAGNOSIS — Z7982 Long term (current) use of aspirin: Secondary | ICD-10-CM | POA: Diagnosis not present

## 2017-03-17 DIAGNOSIS — M797 Fibromyalgia: Secondary | ICD-10-CM | POA: Diagnosis not present

## 2017-03-17 DIAGNOSIS — E039 Hypothyroidism, unspecified: Secondary | ICD-10-CM | POA: Diagnosis not present

## 2017-03-17 DIAGNOSIS — Z7902 Long term (current) use of antithrombotics/antiplatelets: Secondary | ICD-10-CM | POA: Diagnosis not present

## 2017-03-17 DIAGNOSIS — Z79899 Other long term (current) drug therapy: Secondary | ICD-10-CM | POA: Diagnosis not present

## 2017-03-17 DIAGNOSIS — Z955 Presence of coronary angioplasty implant and graft: Secondary | ICD-10-CM | POA: Diagnosis not present

## 2017-03-17 DIAGNOSIS — E785 Hyperlipidemia, unspecified: Secondary | ICD-10-CM | POA: Diagnosis not present

## 2017-03-17 DIAGNOSIS — E559 Vitamin D deficiency, unspecified: Secondary | ICD-10-CM | POA: Diagnosis not present

## 2017-03-17 DIAGNOSIS — F329 Major depressive disorder, single episode, unspecified: Secondary | ICD-10-CM | POA: Diagnosis not present

## 2017-03-17 DIAGNOSIS — K219 Gastro-esophageal reflux disease without esophagitis: Secondary | ICD-10-CM | POA: Diagnosis not present

## 2017-03-17 DIAGNOSIS — I2510 Atherosclerotic heart disease of native coronary artery without angina pectoris: Secondary | ICD-10-CM | POA: Diagnosis not present

## 2017-03-17 DIAGNOSIS — G4700 Insomnia, unspecified: Secondary | ICD-10-CM | POA: Diagnosis not present

## 2017-03-18 DIAGNOSIS — F332 Major depressive disorder, recurrent severe without psychotic features: Secondary | ICD-10-CM | POA: Diagnosis not present

## 2017-03-18 DIAGNOSIS — F331 Major depressive disorder, recurrent, moderate: Secondary | ICD-10-CM | POA: Diagnosis not present

## 2017-03-19 ENCOUNTER — Encounter (HOSPITAL_COMMUNITY): Payer: BLUE CROSS/BLUE SHIELD

## 2017-03-22 ENCOUNTER — Encounter (HOSPITAL_COMMUNITY)
Admission: RE | Admit: 2017-03-22 | Discharge: 2017-03-22 | Disposition: A | Discharge status: Home / Self Care | Payer: BLUE CROSS/BLUE SHIELD | Source: Ambulatory Visit | Attending: Cardiology | Admitting: Cardiology

## 2017-03-22 DIAGNOSIS — E039 Hypothyroidism, unspecified: Secondary | ICD-10-CM | POA: Diagnosis not present

## 2017-03-22 DIAGNOSIS — I10 Essential (primary) hypertension: Secondary | ICD-10-CM | POA: Diagnosis not present

## 2017-03-22 DIAGNOSIS — E785 Hyperlipidemia, unspecified: Secondary | ICD-10-CM | POA: Insufficient documentation

## 2017-03-22 DIAGNOSIS — Z7982 Long term (current) use of aspirin: Secondary | ICD-10-CM | POA: Diagnosis not present

## 2017-03-22 DIAGNOSIS — I2510 Atherosclerotic heart disease of native coronary artery without angina pectoris: Secondary | ICD-10-CM | POA: Diagnosis not present

## 2017-03-22 DIAGNOSIS — M797 Fibromyalgia: Secondary | ICD-10-CM | POA: Insufficient documentation

## 2017-03-22 DIAGNOSIS — E559 Vitamin D deficiency, unspecified: Secondary | ICD-10-CM | POA: Insufficient documentation

## 2017-03-22 DIAGNOSIS — G4700 Insomnia, unspecified: Secondary | ICD-10-CM | POA: Diagnosis not present

## 2017-03-22 DIAGNOSIS — F329 Major depressive disorder, single episode, unspecified: Secondary | ICD-10-CM | POA: Diagnosis not present

## 2017-03-22 DIAGNOSIS — K219 Gastro-esophageal reflux disease without esophagitis: Secondary | ICD-10-CM | POA: Diagnosis not present

## 2017-03-22 DIAGNOSIS — C8190 Hodgkin lymphoma, unspecified, unspecified site: Secondary | ICD-10-CM | POA: Insufficient documentation

## 2017-03-22 DIAGNOSIS — Z955 Presence of coronary angioplasty implant and graft: Secondary | ICD-10-CM

## 2017-03-22 DIAGNOSIS — Z79899 Other long term (current) drug therapy: Secondary | ICD-10-CM | POA: Insufficient documentation

## 2017-03-22 DIAGNOSIS — Z7902 Long term (current) use of antithrombotics/antiplatelets: Secondary | ICD-10-CM | POA: Insufficient documentation

## 2017-03-24 ENCOUNTER — Encounter (HOSPITAL_COMMUNITY)
Admission: RE | Admit: 2017-03-24 | Discharge: 2017-03-24 | Disposition: A | Discharge status: Home / Self Care | Payer: BLUE CROSS/BLUE SHIELD | Source: Ambulatory Visit | Attending: Cardiology | Admitting: Cardiology

## 2017-03-24 DIAGNOSIS — Z79899 Other long term (current) drug therapy: Secondary | ICD-10-CM | POA: Diagnosis not present

## 2017-03-24 DIAGNOSIS — E559 Vitamin D deficiency, unspecified: Secondary | ICD-10-CM | POA: Diagnosis not present

## 2017-03-24 DIAGNOSIS — I2510 Atherosclerotic heart disease of native coronary artery without angina pectoris: Secondary | ICD-10-CM | POA: Diagnosis not present

## 2017-03-24 DIAGNOSIS — I10 Essential (primary) hypertension: Secondary | ICD-10-CM | POA: Diagnosis not present

## 2017-03-24 DIAGNOSIS — E039 Hypothyroidism, unspecified: Secondary | ICD-10-CM | POA: Diagnosis not present

## 2017-03-24 DIAGNOSIS — Z7902 Long term (current) use of antithrombotics/antiplatelets: Secondary | ICD-10-CM | POA: Diagnosis not present

## 2017-03-24 DIAGNOSIS — M797 Fibromyalgia: Secondary | ICD-10-CM | POA: Diagnosis not present

## 2017-03-24 DIAGNOSIS — C8190 Hodgkin lymphoma, unspecified, unspecified site: Secondary | ICD-10-CM | POA: Diagnosis not present

## 2017-03-24 DIAGNOSIS — F329 Major depressive disorder, single episode, unspecified: Secondary | ICD-10-CM | POA: Diagnosis not present

## 2017-03-24 DIAGNOSIS — Z955 Presence of coronary angioplasty implant and graft: Secondary | ICD-10-CM

## 2017-03-24 DIAGNOSIS — K219 Gastro-esophageal reflux disease without esophagitis: Secondary | ICD-10-CM | POA: Diagnosis not present

## 2017-03-24 DIAGNOSIS — E785 Hyperlipidemia, unspecified: Secondary | ICD-10-CM | POA: Diagnosis not present

## 2017-03-24 DIAGNOSIS — G4700 Insomnia, unspecified: Secondary | ICD-10-CM | POA: Diagnosis not present

## 2017-03-24 DIAGNOSIS — Z7982 Long term (current) use of aspirin: Secondary | ICD-10-CM | POA: Diagnosis not present

## 2017-03-25 DIAGNOSIS — F332 Major depressive disorder, recurrent severe without psychotic features: Secondary | ICD-10-CM | POA: Diagnosis not present

## 2017-03-25 DIAGNOSIS — F331 Major depressive disorder, recurrent, moderate: Secondary | ICD-10-CM | POA: Diagnosis not present

## 2017-03-26 ENCOUNTER — Encounter (HOSPITAL_COMMUNITY): Payer: BLUE CROSS/BLUE SHIELD

## 2017-03-29 ENCOUNTER — Encounter (HOSPITAL_COMMUNITY): Payer: BLUE CROSS/BLUE SHIELD

## 2017-03-31 ENCOUNTER — Encounter (HOSPITAL_COMMUNITY): Payer: BLUE CROSS/BLUE SHIELD

## 2017-03-31 ENCOUNTER — Encounter (HOSPITAL_COMMUNITY): Payer: Self-pay | Admitting: *Deleted

## 2017-03-31 DIAGNOSIS — Z955 Presence of coronary angioplasty implant and graft: Secondary | ICD-10-CM

## 2017-03-31 NOTE — Progress Notes (Signed)
Cardiac Individual Treatment Plan  Patient Details  Name: Cynthia Poole MRN: [id number] Date of Birth: Dec 29, 1955 Referring Provider:     CARDIAC REHAB PHASE II ORIENTATION from 02/18/2017 in [place]  Referring Provider  Kela Millin MD      Initial Encounter Date:    CARDIAC REHAB PHASE II ORIENTATION from 02/18/2017 in [place] [facility]  Date  02/18/17  Referring Provider  Kela Millin MD      Visit Diagnosis: 09/29/16 Stented coronary artery  Patient's Home Medications on Admission:  Current Outpatient Prescriptions:  .  ALPRAZolam (XANAX) 1 MG tablet, Take 1 tablet by mouth at bedtime. , Disp: , Rfl:  .  aspirin EC 81 MG EC tablet, Take 1 tablet (81 mg total) by mouth daily., Disp: , Rfl:  .  atorvastatin (LIPITOR) 80 MG tablet, Take 1 tablet (80 mg total) by mouth daily at 6 PM. (Patient taking differently: Take 40 mg by mouth daily at 6 PM. ), Disp: 30 tablet, Rfl: 1 .  Cholecalciferol (VITAMIN D) 2000 UNITS CAPS, Take 1 capsule by mouth daily.  , Disp: , Rfl:  .  clopidogrel (PLAVIX) 75 MG tablet, Take 75 mg by mouth daily., Disp: , Rfl:  .  estradiol (MINIVELLE) 0.05 MG/24HR patch, Place 1 patch onto the skin 2 (two) times a week. Sunday & Wednesday, Disp: , Rfl:  .  estradiol (VIVELLE-DOT) 0.05 MG/24HR, Place 1 patch onto the skin 2 (two) times a week. , Disp: , Rfl:  .  hydrOXYzine (ATARAX/VISTARIL) 25 MG tablet, Take 25 mg by mouth at bedtime., Disp: , Rfl:  .  magnesium 30 MG tablet, Take 30 mg by mouth daily., Disp: , Rfl:  .  nitroGLYCERIN (NITROSTAT) 0.4 MG SL tablet, Place 1 tablet (0.4 mg total) under the tongue every 5 (five) minutes x 3 doses as needed for chest pain., Disp: 25 tablet, Rfl: 2 .  propranolol ER (INDERAL LA) 80 MG 24 hr capsule, Take 1 capsule (80 mg total) by mouth daily. (Patient taking differently: Take 20 mg by mouth 2 (two) times daily. ), Disp: 30 capsule, Rfl: 3  Past  Medical History: Past Medical History:  Diagnosis Date  . Anemia   . CAD in native artery 09/29/2016   Coronary angiogram 09/29/79:[id number] LV systolic function, EF 95%. Left dominant circulation. Mild disease in the ramus and LAD. Circumflex midsegment  90% by IVUS S/P stenting 4.0 x 18 mm Onyx DES.  . DDD (degenerative disc disease), cervical   . DDD (degenerative disc disease), lumbar   . Depression   . Fibromyalgia 03/2004  . GERD (gastroesophageal reflux disease)   . Heart murmur   . History of pleurisy   . HLD (hyperlipidemia)   . Hodgkin's disease 1997    S/P Chemo, Chest RT and splenectomy  . Hypertension   . Hypothyroidism   . Insomnia   . Internal and external bleeding hemorrhoids   . Migraines   . MVP (mitral valve prolapse)   . Osteopenia   . Personal history of colonic adenoma 06/29/2011   06/29/2011 - diminutive adenoma  . Seasonal allergies   . Vitamin D deficiency     Tobacco Use: History  Smoking Status  . Never Smoker  Smokeless Tobacco  . Never Used    Labs: Recent Review Flowsheet Data    Labs for ITP Cardiac and Pulmonary Rehab Latest Ref Rng & Units 09/29/2016 09/30/2016   Cholestrol 0 - 200 mg/dL  225(H) -   LDLCALC 0 - 99 mg/dL 136(H) -   HDL >40 mg/dL 71 -   Trlycerides <150 mg/dL 88 -   Hemoglobin A1c 4.8 - 5.6 % - 5.5      Capillary Blood Glucose: No results found for: GLUCAP   Exercise Target Goals:    Exercise Program Goal: Individual exercise prescription set with THRR, safety & activity barriers. Participant demonstrates ability to understand and report RPE using BORG scale, to self-measure pulse accurately, and to acknowledge the importance of the exercise prescription.  Exercise Prescription Goal: Starting with aerobic activity 30 plus minutes a day, 3 days per week for initial exercise prescription. Provide home exercise prescription and guidelines that participant acknowledges understanding prior to discharge.  Activity Barriers  & Risk Stratification:     Activity Barriers & Cardiac Risk Stratification - 02/18/17 1246      Activity Barriers & Cardiac Risk Stratification   Activity Barriers None   Cardiac Risk Stratification High      6 Minute Walk:     6 Minute Walk    Row Name 02/18/17 1308         6 Minute Walk   Phase Initial     Distance 1882 feet     Walk Time 6 minutes     # of Rest Breaks 0     MPH 3.6     METS 5.1     RPE 12     VO2 Peak 17.72     Symptoms Yes (comment)     Comments balance concerns     Resting HR 78 bpm     Resting BP 148/80     Resting Oxygen Saturation  98 %     Exercise Oxygen Saturation  during 6 min walk 100 %     Max Ex. HR 115 bpm     Max Ex. BP 168/70     2 Minute Post BP 134/70        Oxygen Initial Assessment:   Oxygen Re-Evaluation:   Oxygen Discharge (Final Oxygen Re-Evaluation):   Initial Exercise Prescription:     Initial Exercise Prescription - 02/18/17 1300      Date of Initial Exercise RX and Referring Provider   Date 02/18/17   Referring Provider Kela Millin MD     Treadmill   MPH 3   Grade 1   Minutes 10   METs 3.71     Bike   Level 0.8   Minutes 10   METs 3.32     NuStep   Level 3   SPM 80   Minutes 10   METs 2.5     Arm Ergometer   Level 2   Watts 20   Minutes 10   METs 2.61     Prescription Details   Frequency (times per week) 3   Duration Progress to 30 minutes of continuous aerobic without signs/symptoms of physical distress     Intensity   THRR 40-80% of Max Heartrate 64-127   Ratings of Perceived Exertion 11-13   Perceived Dyspnea 0-4     Progression   Progression Continue to progress workloads to maintain intensity without signs/symptoms of physical distress.     Resistance Training   Training Prescription Yes   Weight 3lbs   Reps 10-15      Perform Capillary Blood Glucose checks as needed.  Exercise Prescription Changes:     Exercise Prescription Changes    Row Name 03/02/17  1100 03/15/17 1800  03/24/17 0948         Response to Exercise   Blood Pressure (Admit) 118/72 122/76 100/78     Blood Pressure (Exercise) 118/62 134/70 128/70     Blood Pressure (Exit) 108/60 102/60 103/72     Heart Rate (Admit) 74 bpm 77 bpm 80 bpm     Heart Rate (Exercise) 116 bpm 121 bpm 126 bpm     Heart Rate (Exit) 73 bpm 77 bpm 85 bpm     Rating of Perceived Exertion (Exercise) _0 Symptoms none none none     Duration Continue with 30 min of aerobic exercise without signs/symptoms of physical distress. Continue with 30 min of aerobic exercise without signs/symptoms of physical distress. Progress to 45 minutes of aerobic exercise without signs/symptoms of physical distress     Intensity THRR unchanged THRR unchanged THRR unchanged       Progression   Progression Continue to progress workloads to maintain intensity without signs/symptoms of physical distress. Continue to progress workloads to maintain intensity without signs/symptoms of physical distress. Continue to progress workloads to maintain intensity without signs/symptoms of physical distress.     Average METs 4.2 4.6 5.5       Resistance Training   Training Prescription No  No weights this session. Yes  y Yes  y     Weight 3lbs 5lbs 5lbs     Reps 10-15 10-15 10-15     Time 10 Minutes 10 Minutes 10 Minutes       Interval Training   Interval Training No No No       Treadmill   MPH 3 3 3.2     Grade _1 Minutes _2 METs 3.71 3.71 4.33       Bike   Level 1.7 1.7 2     Minutes _3 METs 5.85 5.86 6.69       NuStep   Level _4 SPM 80 80 80     Minutes _5 METs 3.1 4.2 5.5       Arm Ergometer   Level -  -  -     Watts -  -  -     Minutes -  -  -     METs -  -  -       Home Exercise Plan   Plans to continue exercise at  - Home (comment) Home (comment)     Frequency  - Add 3 additional days to program exercise sessions. Add 3 additional days to program exercise  sessions.     Initial Home Exercises Provided  - 03/05/17 03/05/17        Exercise Comments:     Exercise Comments    Row Name 03/03/17 1455 03/05/17 1105 03/17/17 0950 03/24/17 0948     Exercise Comments Reviewed METs and goals with patient. Reviewed home exercise guidelines with patient. Reviewed MET level with patient. Patient is doing well with exercise.       Exercise Goals and Review:     Exercise Goals    Row Name 02/18/17 1319             Exercise Goals   Increase Physical Activity Yes       Intervention Provide advice, education, support and counseling about physical activity/exercise needs.;Develop an individualized exercise  prescription for aerobic and resistive training based on initial evaluation findings, risk stratification, comorbidities and participant's personal goals.       Expected Outcomes Achievement of increased cardiorespiratory fitness and enhanced flexibility, muscular endurance and strength shown through measurements of functional capacity and personal statement of participant.       Increase Strength and Stamina Yes       Intervention Provide advice, education, support and counseling about physical activity/exercise needs.;Develop an individualized exercise prescription for aerobic and resistive training based on initial evaluation findings, risk stratification, comorbidities and participant's personal goals.       Expected Outcomes Achievement of increased cardiorespiratory fitness and enhanced flexibility, muscular endurance and strength shown through measurements of functional capacity and personal statement of participant.       Able to understand and use rate of perceived exertion (RPE) scale Yes       Intervention Provide education and explanation on how to use RPE scale       Expected Outcomes Short Term: Able to use RPE daily in rehab to express subjective intensity level;Long Term:  Able to use RPE to guide intensity level when exercising  independently          Exercise Goals Re-Evaluation :     Exercise Goals Re-Evaluation    Row Name 03/03/17 1455 03/17/17 0950 03/24/17 0948         Exercise Goal Re-Evaluation   Exercise Goals Review Increase Physical Activity;Increase Strength and Stamina Knowledge and understanding of Target Heart Rate Range (THRR);Able to understand and use rate of perceived exertion (RPE) scale Able to understand and use rate of perceived exertion (RPE) scale;Increase Physical Activity;Understanding of Exercise Prescription     Comments Off to a good start with exercise. Pt has progressed to 4.2 METs.  Patient can verbalize THRR and RPE scale. Pt can verbalize how to count pulse. Patient is making consistent progress with exercise, averaging 5.5 METs.     Expected Outcomes Pt will begin home exercise program in addition to CR. Pt eventually wants to return to [place]. Continue exercise at home and CR to achieve health and fitness goals. Patient will have more confidence with exercise and maintanin regular exercise routine.         Discharge Exercise Prescription (Final Exercise Prescription Changes):     Exercise Prescription Changes - 03/24/17 0948      Response to Exercise   Blood Pressure (Admit) 100/78   Blood Pressure (Exercise) 128/70   Blood Pressure (Exit) 103/72   Heart Rate (Admit) 80 bpm   Heart Rate (Exercise) 126 bpm   Heart Rate (Exit) 85 bpm   Rating of Perceived Exertion (Exercise) 13   Symptoms none   Duration Progress to 45 minutes of aerobic exercise without signs/symptoms of physical distress   Intensity THRR unchanged     Progression   Progression Continue to progress workloads to maintain intensity without signs/symptoms of physical distress.   Average METs 5.5     Resistance Training   Training Prescription Yes  y   Weight 5lbs   Reps 10-15   Time 10 Minutes     Interval Training   Interval Training No     Treadmill   MPH 3.2   Grade 2   Minutes 10    METs 4.33     Bike   Level 2   Minutes 10   METs 6.69     NuStep   Level 6   SPM 80   Minutes 10  METs 5.5     Home Exercise Plan   Plans to continue exercise at Home (comment)   Frequency Add 3 additional days to program exercise sessions.   Initial Home Exercises Provided 03/05/17      Nutrition:  Target Goals: Understanding of nutrition guidelines, daily intake of sodium <1556m, cholesterol <2047m calories 30% from fat and 7% or less from saturated fats, daily to have 5 or more servings of fruits and vegetables.  Biometrics:     Pre Biometrics - 02/18/17 1408      Pre Biometrics   Height _0  (1.676 m)   Weight 146 lb 9.7 oz (66.5 kg)   Waist Circumference 30 inches   Hip Circumference 40 inches   Waist to Hip Ratio 0.75 %   BMI (Calculated) 23.67   Triceps Skinfold 35 mm   % Body Fat 35.6 %   Grip Strength 32 kg   Flexibility 13.5 in   Single Leg Stand 16.09 seconds       Nutrition Therapy Plan and Nutrition Goals:     Nutrition Therapy & Goals - 02/18/17 1119      Nutrition Therapy   Diet Therapeutic Lifestyle Changes     Personal Nutrition Goals   Nutrition Goal Wt loss of 1-2 lb/week to a wt loss goal of 6-10 lb at [facility] regarding individualized specific dietary modifications aiming towards targeted core components such as weight, hypertension, lipid management, diabetes, heart failure and other comorbidities.   Expected Outcomes Short Term Goal: Understand basic principles of dietary content, such as calories, fat, sodium, cholesterol and nutrients.;Long Term Goal: Adherence to prescribed nutrition plan.      Nutrition Discharge: Nutrition Scores:     Nutrition Assessments - 02/18/17 1119      MEDFICTS Scores   Pre Score 24      Nutrition Goals Re-Evaluation:   Nutrition Goals Re-Evaluation:   Nutrition Goals Discharge (Final Nutrition  Goals Re-Evaluation):   Psychosocial: Target Goals: Acknowledge presence or absence of significant depression and/or stress, maximize coping skills, provide positive support system. Participant is able to verbalize types and ability to use techniques and skills needed for reducing stress and depression.  Initial Review & Psychosocial Screening:     Initial Psych Review & Screening - 02/18/17 1239      Initial Review   Current issues with History of Depression     Family Dynamics   Good Support System? Yes   Comments --  AnShonas seperated from her husband currently     Barriers   Psychosocial barriers to participate in program The patient should benefit from training in stress management and relaxation.     Screening Interventions   Interventions Encouraged to exercise;To provide support and resources with identified psychosocial needs      Quality of Life Scores:     Quality of Life - 02/24/17 1704      Quality of Life Scores   Health/Function Pre --  dissatisfied with health due to heart problems and sciatica   Socioeconomic Pre --  deines having any financial stressors but worries a lot   Family Pre --  concerned about one of her daughter's family life   GLOBAL Pre --  reivewed with the patient seeing a therapist ona frequent basis      PHQ-9: Recent Review Flowsheet Data    [facility]   Decreased Interest  0   Down, Depressed, Hopeless 1   PHQ - 2 Score 1     Interpretation of Total Score  Total Score Depression Severity:  1-4 = Minimal depression, 5-9 = Mild depression, 10-14 = Moderate depression, 15-19 = Moderately severe depression, 20-27 = Severe depression   Psychosocial Evaluation and Intervention:   Psychosocial Re-Evaluation:     Psychosocial Re-Evaluation    Row Name 03/31/17 1712             Psychosocial Re-Evaluation   Current issues with Current Stress Concerns       Comments Shamaria sees a therapist ona regular  basis        Expected Outcomes Will continue to offer emotional support and resoources as needed       Interventions Encouraged to attend Cardiac Rehabilitation for the exercise       Continue Psychosocial Services  No Follow up required         Initial Review   Source of Stress Concerns Family          Psychosocial Discharge (Final Psychosocial Re-Evaluation):     Psychosocial Re-Evaluation - 03/31/17 1712      Psychosocial Re-Evaluation   Current issues with Current Stress Concerns   Comments Helena sees a therapist ona regular basis    Expected Outcomes Will continue to offer emotional support and resoources as needed   Interventions Encouraged to attend Cardiac Rehabilitation for the exercise   Continue Psychosocial Services  No Follow up required     Initial Review   Source of Stress Concerns Family      Vocational Rehabilitation: Provide vocational rehab assistance to qualifying candidates.   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 Participant will state understanding/return demonstration of topics presented.  Learning Barriers/Preferences:     Learning Barriers/Preferences - 02/18/17 1238      Learning Barriers/Preferences   Learning Barriers Sight  wears glasses   Learning Preferences Written Material;Pictoral      Education Topics: Count Your Pulse:  -Group instruction provided by verbal instruction, demonstration, patient participation and written materials to support subject.  Instructors address importance of being able to find your pulse and how to count your pulse when at home without a heart monitor.  Patients get  hands on experience counting their pulse with staff help and individually.   Heart Attack, Angina, and Risk Factor Modification:  -Group instruction provided by verbal instruction, video, and written materials to support subject.  Instructors address signs and symptoms of angina and heart attacks.    Also discuss risk factors for heart disease and how to make changes to improve heart health risk factors.   Functional Fitness:  -Group instruction provided by verbal instruction, demonstration, patient participation, and written materials to support subject.  Instructors address safety measures for doing things around the house.  Discuss how to get up and down off the floor, how to pick things up properly, how to safely get out of a chair without assistance, and balance training.   Meditation and Mindfulness:  -Group instruction provided by verbal instruction, patient participation, and written materials to support subject.  Instructor  addresses importance of mindfulness and meditation practice to help reduce stress and improve awareness.  Instructor also leads participants through a meditation exercise.    Stretching for Flexibility and Mobility:  -Group instruction provided by verbal instruction, patient participation, and written materials to support subject.  Instructors lead participants through series of stretches that are designed to increase flexibility thus improving mobility.  These stretches are additional exercise for major muscle groups that are typically performed during regular warm up and cool down.   Hands Only CPR:  -Group verbal, video, and participation provides a basic overview of AHA guidelines for community CPR. Role-play of emergencies allow participants the opportunity to practice calling for help and chest compression technique with discussion of AED use.   Hypertension: -Group verbal and written instruction that provides a basic overview of hypertension including the  most recent diagnostic guidelines, risk factor reduction with self-care instructions and medication management.    Nutrition I class: Heart Healthy Eating:  -Group instruction provided by PowerPoint slides, verbal discussion, and written materials to support subject matter. The instructor gives an explanation and review of the Therapeutic Lifestyle Changes diet recommendations, which includes a discussion on lipid goals, dietary fat, sodium, fiber, plant stanol/sterol esters, sugar, and the components of a well-balanced, healthy diet.   CARDIAC REHAB PHASE II ORIENTATION from 02/18/2017 in [place]  Date  02/18/17  Educator  RD  Instruction Review Code  Not applicable      Nutrition II class: Lifestyle Skills:  -Group instruction provided by PowerPoint slides, verbal discussion, and written materials to support subject matter. The instructor gives an explanation and review of label reading, grocery shopping for heart health, heart healthy recipe modifications, and ways to make healthier choices when eating out.   CARDIAC REHAB PHASE II ORIENTATION from 02/18/2017 in [place]  Date  02/18/17  Educator  RD  Instruction Review Code  Not applicable      Diabetes Question & Answer:  -Group instruction provided by PowerPoint slides, verbal discussion, and written materials to support subject matter. The instructor gives an explanation and review of diabetes co-morbidities, pre- and post-prandial blood glucose goals, pre-exercise blood glucose goals, signs, symptoms, and treatment of hypoglycemia and hyperglycemia, and foot care basics.   Diabetes Blitz:  -Group instruction provided by PowerPoint slides, verbal discussion, and written materials to support subject matter. The instructor gives an explanation and review of the physiology behind type 1 and type 2 diabetes, diabetes medications and rational behind using different  medications, pre- and post-prandial blood glucose recommendations and Hemoglobin A1c goals, diabetes diet, and exercise including blood glucose guidelines for exercising safely.    Portion Distortion:  -Group instruction provided by PowerPoint slides, verbal discussion, written materials, and food models to support subject matter. The instructor gives an explanation of serving size versus portion size, changes in portions sizes over the last 20 years, and what consists of a serving from each food group.   Stress Management:  -Group instruction provided by verbal instruction, video, and written materials to support subject matter.  Instructors review role of stress in heart disease and how to cope with stress positively.     Exercising on Your Own:  -Group instruction provided by verbal instruction, power point, and written materials to support subject.  Instructors discuss benefits of exercise, components of exercise, frequency and intensity of exercise, and end points for exercise.  Also discuss use of nitroglycerin and activating EMS.  Review  options of places to exercise outside of rehab.  Review guidelines for sex with heart disease.   Cardiac Drugs I:  -Group instruction provided by verbal instruction and written materials to support subject.  Instructor reviews cardiac drug classes: antiplatelets, anticoagulants, beta blockers, and statins.  Instructor discusses reasons, side effects, and lifestyle considerations for each drug class.   Cardiac Drugs II:  -Group instruction provided by verbal instruction and written materials to support subject.  Instructor reviews cardiac drug classes: angiotensin converting enzyme inhibitors (ACE-I), angiotensin II receptor blockers (ARBs), nitrates, and calcium channel blockers.  Instructor discusses reasons, side effects, and lifestyle considerations for each drug class.   Anatomy and Physiology of the Circulatory System:  Group verbal and written  instruction and models provide basic cardiac anatomy and physiology, with the coronary electrical and arterial systems. Review of: AMI, Angina, Valve disease, Heart Failure, Peripheral Artery Disease, Cardiac Arrhythmia, Pacemakers, and the ICD.   Other Education:  -Group or individual verbal, written, or video instructions that support the educational goals of the cardiac rehab program.   Knowledge Questionnaire Score:     Knowledge Questionnaire Score - 02/18/17 1308      Knowledge Questionnaire Score   Pre Score 21/24      Core Components/Risk Factors/Patient Goals at Admission:     Personal Goals and Risk Factors at Admission - 02/18/17 1237      Core Components/Risk Factors/Patient Goals on Admission   Hypertension Yes   Intervention Provide education on lifestyle modifcations including regular physical activity/exercise, weight management, moderate sodium restriction and increased consumption of fresh fruit, vegetables, and low fat dairy, alcohol moderation, and smoking cessation.;Monitor prescription use compliance.   Expected Outcomes Short Term: Continued assessment and intervention until BP is < 140/64m HG in hypertensive participants. < 130/874mHG in hypertensive participants with diabetes, heart failure or chronic kidney disease.;Long Term: Maintenance of blood pressure at goal levels.   Lipids Yes   Intervention Provide education and support for participant on nutrition & aerobic/resistive exercise along with prescribed medications to achieve LDL <7069mHDL >69m62m Expected Outcomes Short Term: Participant states understanding of desired cholesterol values and is compliant with medications prescribed. Participant is following exercise prescription and nutrition guidelines.;Long Term: Cholesterol controlled with medications as prescribed, with individualized exercise RX and with personalized nutrition plan. Value goals: LDL < 70mg41mL > 40 mg.   Stress Yes   Intervention  Offer individual and/or small group education and counseling on adjustment to heart disease, stress management and health-related lifestyle change. Teach and support self-help strategies.;Refer participants experiencing significant psychosocial distress to appropriate mental health specialists for further evaluation and treatment. When possible, include family members and significant others in education/counseling sessions.   Expected Outcomes Short Term: Participant demonstrates changes in health-related behavior, relaxation and other stress management skills, ability to obtain effective social support, and compliance with psychotropic medications if prescribed.;Long Term: Emotional wellbeing is indicated by absence of clinically significant psychosocial distress or social isolation.      Core Components/Risk Factors/Patient Goals Review:      Goals and Risk Factor Review    Row Name 03/31/17 1710             Core Components/Risk Factors/Patient Goals Review   Personal Goals Review Hypertension;Lipids;Stress       Review Saarah's vital signs have been stable at cardiac rehab. Massa iJacquesoing well with exercise       Expected Outcomes Latanza wEsraa continue to take her medicaitons as presribed  and continue to exercise at cardiac rehab          Core Components/Risk Factors/Patient Goals at Discharge (Final Review):      Goals and Risk Factor Review - 03/31/17 1710      Core Components/Risk Factors/Patient Goals Review   Personal Goals Review Hypertension;Lipids;Stress   Review Kadeidra's vital signs have been stable at cardiac rehab. Yannely is doing well with exercise   Expected Outcomes Aaleah will continue to take her medicaitons as presribed and continue to exercise at cardiac rehab      ITP Comments:     ITP Comments    Row Name 02/18/17 0844           ITP Comments Medical Director, Dr. Fransico Him          Comments: Robbyn is making expected progress toward personal goals after completing  11 sessions. Recommend continued exercise and life style modification education including  stress management and relaxation techniques to decrease cardiac risk profile. Takiya is doing well with exercise. Maricella is out of town this week.Barnet Pall, RN,BSN 03/31/2017 5:20 PM

## 2017-04-02 ENCOUNTER — Encounter (HOSPITAL_COMMUNITY): Admission: RE | Admit: 2017-04-02 | Payer: BLUE CROSS/BLUE SHIELD | Source: Ambulatory Visit

## 2017-04-05 ENCOUNTER — Encounter (HOSPITAL_COMMUNITY)
Admission: RE | Admit: 2017-04-05 | Discharge: 2017-04-05 | Disposition: A | Discharge status: Home / Self Care | Payer: BLUE CROSS/BLUE SHIELD | Source: Ambulatory Visit | Attending: Cardiology | Admitting: Cardiology

## 2017-04-05 DIAGNOSIS — I2510 Atherosclerotic heart disease of native coronary artery without angina pectoris: Secondary | ICD-10-CM | POA: Diagnosis not present

## 2017-04-05 DIAGNOSIS — Z955 Presence of coronary angioplasty implant and graft: Secondary | ICD-10-CM | POA: Diagnosis not present

## 2017-04-05 DIAGNOSIS — G4700 Insomnia, unspecified: Secondary | ICD-10-CM | POA: Diagnosis not present

## 2017-04-05 DIAGNOSIS — E039 Hypothyroidism, unspecified: Secondary | ICD-10-CM | POA: Diagnosis not present

## 2017-04-05 DIAGNOSIS — F329 Major depressive disorder, single episode, unspecified: Secondary | ICD-10-CM | POA: Diagnosis not present

## 2017-04-05 DIAGNOSIS — M797 Fibromyalgia: Secondary | ICD-10-CM | POA: Diagnosis not present

## 2017-04-05 DIAGNOSIS — Z79899 Other long term (current) drug therapy: Secondary | ICD-10-CM | POA: Diagnosis not present

## 2017-04-05 DIAGNOSIS — Z7902 Long term (current) use of antithrombotics/antiplatelets: Secondary | ICD-10-CM | POA: Diagnosis not present

## 2017-04-05 DIAGNOSIS — E785 Hyperlipidemia, unspecified: Secondary | ICD-10-CM | POA: Diagnosis not present

## 2017-04-05 DIAGNOSIS — C8190 Hodgkin lymphoma, unspecified, unspecified site: Secondary | ICD-10-CM | POA: Diagnosis not present

## 2017-04-05 DIAGNOSIS — I10 Essential (primary) hypertension: Secondary | ICD-10-CM | POA: Diagnosis not present

## 2017-04-05 DIAGNOSIS — E559 Vitamin D deficiency, unspecified: Secondary | ICD-10-CM | POA: Diagnosis not present

## 2017-04-05 DIAGNOSIS — Z7982 Long term (current) use of aspirin: Secondary | ICD-10-CM | POA: Diagnosis not present

## 2017-04-05 DIAGNOSIS — K219 Gastro-esophageal reflux disease without esophagitis: Secondary | ICD-10-CM | POA: Diagnosis not present

## 2017-04-06 DIAGNOSIS — D225 Melanocytic nevi of trunk: Secondary | ICD-10-CM | POA: Diagnosis not present

## 2017-04-06 DIAGNOSIS — Z85828 Personal history of other malignant neoplasm of skin: Secondary | ICD-10-CM | POA: Diagnosis not present

## 2017-04-06 DIAGNOSIS — L700 Acne vulgaris: Secondary | ICD-10-CM | POA: Diagnosis not present

## 2017-04-06 DIAGNOSIS — L821 Other seborrheic keratosis: Secondary | ICD-10-CM | POA: Diagnosis not present

## 2017-04-07 ENCOUNTER — Encounter (HOSPITAL_COMMUNITY): Payer: BLUE CROSS/BLUE SHIELD

## 2017-04-08 DIAGNOSIS — F332 Major depressive disorder, recurrent severe without psychotic features: Secondary | ICD-10-CM | POA: Diagnosis not present

## 2017-04-08 DIAGNOSIS — F331 Major depressive disorder, recurrent, moderate: Secondary | ICD-10-CM | POA: Diagnosis not present

## 2017-04-09 ENCOUNTER — Encounter (HOSPITAL_COMMUNITY): Payer: BLUE CROSS/BLUE SHIELD

## 2017-04-12 ENCOUNTER — Encounter (HOSPITAL_COMMUNITY): Payer: BLUE CROSS/BLUE SHIELD

## 2017-04-14 ENCOUNTER — Encounter (HOSPITAL_COMMUNITY)
Admission: RE | Admit: 2017-04-14 | Discharge: 2017-04-14 | Disposition: A | Discharge status: Home / Self Care | Payer: BLUE CROSS/BLUE SHIELD | Source: Ambulatory Visit | Attending: Cardiology | Admitting: Cardiology

## 2017-04-14 DIAGNOSIS — M797 Fibromyalgia: Secondary | ICD-10-CM | POA: Diagnosis not present

## 2017-04-14 DIAGNOSIS — Z7902 Long term (current) use of antithrombotics/antiplatelets: Secondary | ICD-10-CM | POA: Diagnosis not present

## 2017-04-14 DIAGNOSIS — Z955 Presence of coronary angioplasty implant and graft: Secondary | ICD-10-CM | POA: Diagnosis not present

## 2017-04-14 DIAGNOSIS — E039 Hypothyroidism, unspecified: Secondary | ICD-10-CM | POA: Diagnosis not present

## 2017-04-14 DIAGNOSIS — I10 Essential (primary) hypertension: Secondary | ICD-10-CM | POA: Diagnosis not present

## 2017-04-14 DIAGNOSIS — C8190 Hodgkin lymphoma, unspecified, unspecified site: Secondary | ICD-10-CM | POA: Diagnosis not present

## 2017-04-14 DIAGNOSIS — Z7982 Long term (current) use of aspirin: Secondary | ICD-10-CM | POA: Diagnosis not present

## 2017-04-14 DIAGNOSIS — K219 Gastro-esophageal reflux disease without esophagitis: Secondary | ICD-10-CM | POA: Diagnosis not present

## 2017-04-14 DIAGNOSIS — F329 Major depressive disorder, single episode, unspecified: Secondary | ICD-10-CM | POA: Diagnosis not present

## 2017-04-14 DIAGNOSIS — G4700 Insomnia, unspecified: Secondary | ICD-10-CM | POA: Diagnosis not present

## 2017-04-14 DIAGNOSIS — E785 Hyperlipidemia, unspecified: Secondary | ICD-10-CM | POA: Diagnosis not present

## 2017-04-14 DIAGNOSIS — E559 Vitamin D deficiency, unspecified: Secondary | ICD-10-CM | POA: Diagnosis not present

## 2017-04-14 DIAGNOSIS — Z79899 Other long term (current) drug therapy: Secondary | ICD-10-CM | POA: Diagnosis not present

## 2017-04-14 DIAGNOSIS — I2510 Atherosclerotic heart disease of native coronary artery without angina pectoris: Secondary | ICD-10-CM | POA: Diagnosis not present

## 2017-04-15 DIAGNOSIS — F331 Major depressive disorder, recurrent, moderate: Secondary | ICD-10-CM | POA: Diagnosis not present

## 2017-04-16 ENCOUNTER — Encounter (HOSPITAL_COMMUNITY)
Admission: RE | Admit: 2017-04-16 | Discharge: 2017-04-16 | Disposition: A | Discharge status: Home / Self Care | Payer: BLUE CROSS/BLUE SHIELD | Source: Ambulatory Visit | Attending: Cardiology | Admitting: Cardiology

## 2017-04-16 DIAGNOSIS — F329 Major depressive disorder, single episode, unspecified: Secondary | ICD-10-CM | POA: Diagnosis not present

## 2017-04-16 DIAGNOSIS — E039 Hypothyroidism, unspecified: Secondary | ICD-10-CM | POA: Diagnosis not present

## 2017-04-16 DIAGNOSIS — E559 Vitamin D deficiency, unspecified: Secondary | ICD-10-CM | POA: Diagnosis not present

## 2017-04-16 DIAGNOSIS — K219 Gastro-esophageal reflux disease without esophagitis: Secondary | ICD-10-CM | POA: Diagnosis not present

## 2017-04-16 DIAGNOSIS — Z79899 Other long term (current) drug therapy: Secondary | ICD-10-CM | POA: Diagnosis not present

## 2017-04-16 DIAGNOSIS — E785 Hyperlipidemia, unspecified: Secondary | ICD-10-CM | POA: Diagnosis not present

## 2017-04-16 DIAGNOSIS — C8190 Hodgkin lymphoma, unspecified, unspecified site: Secondary | ICD-10-CM | POA: Diagnosis not present

## 2017-04-16 DIAGNOSIS — G4700 Insomnia, unspecified: Secondary | ICD-10-CM | POA: Diagnosis not present

## 2017-04-16 DIAGNOSIS — I10 Essential (primary) hypertension: Secondary | ICD-10-CM | POA: Diagnosis not present

## 2017-04-16 DIAGNOSIS — Z7902 Long term (current) use of antithrombotics/antiplatelets: Secondary | ICD-10-CM | POA: Diagnosis not present

## 2017-04-16 DIAGNOSIS — I2510 Atherosclerotic heart disease of native coronary artery without angina pectoris: Secondary | ICD-10-CM | POA: Diagnosis not present

## 2017-04-16 DIAGNOSIS — Z7982 Long term (current) use of aspirin: Secondary | ICD-10-CM | POA: Diagnosis not present

## 2017-04-16 DIAGNOSIS — Z955 Presence of coronary angioplasty implant and graft: Secondary | ICD-10-CM | POA: Diagnosis not present

## 2017-04-16 DIAGNOSIS — M797 Fibromyalgia: Secondary | ICD-10-CM | POA: Diagnosis not present

## 2017-04-19 ENCOUNTER — Encounter (HOSPITAL_COMMUNITY)
Admission: RE | Admit: 2017-04-19 | Discharge: 2017-04-19 | Disposition: A | Discharge status: Home / Self Care | Payer: BLUE CROSS/BLUE SHIELD | Source: Ambulatory Visit | Attending: Cardiology | Admitting: Cardiology

## 2017-04-19 DIAGNOSIS — Z955 Presence of coronary angioplasty implant and graft: Secondary | ICD-10-CM

## 2017-04-19 DIAGNOSIS — I2510 Atherosclerotic heart disease of native coronary artery without angina pectoris: Secondary | ICD-10-CM | POA: Diagnosis not present

## 2017-04-19 DIAGNOSIS — E559 Vitamin D deficiency, unspecified: Secondary | ICD-10-CM | POA: Diagnosis not present

## 2017-04-19 DIAGNOSIS — Z7982 Long term (current) use of aspirin: Secondary | ICD-10-CM | POA: Diagnosis not present

## 2017-04-19 DIAGNOSIS — Z7902 Long term (current) use of antithrombotics/antiplatelets: Secondary | ICD-10-CM | POA: Diagnosis not present

## 2017-04-19 DIAGNOSIS — E785 Hyperlipidemia, unspecified: Secondary | ICD-10-CM | POA: Diagnosis not present

## 2017-04-19 DIAGNOSIS — F329 Major depressive disorder, single episode, unspecified: Secondary | ICD-10-CM | POA: Diagnosis not present

## 2017-04-19 DIAGNOSIS — Z79899 Other long term (current) drug therapy: Secondary | ICD-10-CM | POA: Diagnosis not present

## 2017-04-19 DIAGNOSIS — K219 Gastro-esophageal reflux disease without esophagitis: Secondary | ICD-10-CM | POA: Diagnosis not present

## 2017-04-19 DIAGNOSIS — C8190 Hodgkin lymphoma, unspecified, unspecified site: Secondary | ICD-10-CM | POA: Diagnosis not present

## 2017-04-19 DIAGNOSIS — M797 Fibromyalgia: Secondary | ICD-10-CM | POA: Diagnosis not present

## 2017-04-19 DIAGNOSIS — G4700 Insomnia, unspecified: Secondary | ICD-10-CM | POA: Diagnosis not present

## 2017-04-19 DIAGNOSIS — I10 Essential (primary) hypertension: Secondary | ICD-10-CM | POA: Diagnosis not present

## 2017-04-19 DIAGNOSIS — E039 Hypothyroidism, unspecified: Secondary | ICD-10-CM | POA: Diagnosis not present

## 2017-04-21 ENCOUNTER — Encounter (HOSPITAL_COMMUNITY): Admission: RE | Admit: 2017-04-21 | Payer: BLUE CROSS/BLUE SHIELD | Source: Ambulatory Visit

## 2017-04-23 ENCOUNTER — Encounter (HOSPITAL_COMMUNITY): Payer: BLUE CROSS/BLUE SHIELD

## 2017-04-23 ENCOUNTER — Encounter (HOSPITAL_COMMUNITY): Payer: Self-pay | Admitting: *Deleted

## 2017-04-23 DIAGNOSIS — Z955 Presence of coronary angioplasty implant and graft: Secondary | ICD-10-CM

## 2017-04-23 NOTE — Progress Notes (Signed)
Cardiac Individual Treatment Plan  Patient Details  Name: Cynthia Poole MRN: [id number] Date of Birth: Dec 29, 1955 Referring Provider:     CARDIAC REHAB PHASE II ORIENTATION from 02/18/2017 in [place]  Referring Provider  Kela Millin MD      Initial Encounter Date:    CARDIAC REHAB PHASE II ORIENTATION from 02/18/2017 in [place] [facility]  Date  02/18/17  Referring Provider  Kela Millin MD      Visit Diagnosis: 09/29/16 Stented coronary artery  Patient's Home Medications on Admission:  Current Outpatient Prescriptions:  .  ALPRAZolam (XANAX) 1 MG tablet, Take 1 tablet by mouth at bedtime. , Disp: , Rfl:  .  aspirin EC 81 MG EC tablet, Take 1 tablet (81 mg total) by mouth daily., Disp: , Rfl:  .  atorvastatin (LIPITOR) 80 MG tablet, Take 1 tablet (80 mg total) by mouth daily at 6 PM. (Patient taking differently: Take 40 mg by mouth daily at 6 PM. ), Disp: 30 tablet, Rfl: 1 .  Cholecalciferol (VITAMIN D) 2000 UNITS CAPS, Take 1 capsule by mouth daily.  , Disp: , Rfl:  .  clopidogrel (PLAVIX) 75 MG tablet, Take 75 mg by mouth daily., Disp: , Rfl:  .  estradiol (MINIVELLE) 0.05 MG/24HR patch, Place 1 patch onto the skin 2 (two) times a week. Sunday & Wednesday, Disp: , Rfl:  .  estradiol (VIVELLE-DOT) 0.05 MG/24HR, Place 1 patch onto the skin 2 (two) times a week. , Disp: , Rfl:  .  hydrOXYzine (ATARAX/VISTARIL) 25 MG tablet, Take 25 mg by mouth at bedtime., Disp: , Rfl:  .  magnesium 30 MG tablet, Take 30 mg by mouth daily., Disp: , Rfl:  .  nitroGLYCERIN (NITROSTAT) 0.4 MG SL tablet, Place 1 tablet (0.4 mg total) under the tongue every 5 (five) minutes x 3 doses as needed for chest pain., Disp: 25 tablet, Rfl: 2 .  propranolol ER (INDERAL LA) 80 MG 24 hr capsule, Take 1 capsule (80 mg total) by mouth daily. (Patient taking differently: Take 20 mg by mouth 2 (two) times daily. ), Disp: 30 capsule, Rfl: 3  Past  Medical History: Past Medical History:  Diagnosis Date  . Anemia   . CAD in native artery 09/29/2016   Coronary angiogram 09/29/79:[id number] LV systolic function, EF 95%. Left dominant circulation. Mild disease in the ramus and LAD. Circumflex midsegment  90% by IVUS S/P stenting 4.0 x 18 mm Onyx DES.  . DDD (degenerative disc disease), cervical   . DDD (degenerative disc disease), lumbar   . Depression   . Fibromyalgia 03/2004  . GERD (gastroesophageal reflux disease)   . Heart murmur   . History of pleurisy   . HLD (hyperlipidemia)   . Hodgkin's disease 1997    S/P Chemo, Chest RT and splenectomy  . Hypertension   . Hypothyroidism   . Insomnia   . Internal and external bleeding hemorrhoids   . Migraines   . MVP (mitral valve prolapse)   . Osteopenia   . Personal history of colonic adenoma 06/29/2011   06/29/2011 - diminutive adenoma  . Seasonal allergies   . Vitamin D deficiency     Tobacco Use: History  Smoking Status  . Never Smoker  Smokeless Tobacco  . Never Used    Labs: Recent Review Flowsheet Data    Labs for ITP Cardiac and Pulmonary Rehab Latest Ref Rng & Units 09/29/2016 09/30/2016   Cholestrol 0 - 200 mg/dL  225(H) -   LDLCALC 0 - 99 mg/dL 136(H) -   HDL >40 mg/dL 71 -   Trlycerides <150 mg/dL 88 -   Hemoglobin A1c 4.8 - 5.6 % - 5.5      Capillary Blood Glucose: No results found for: GLUCAP   Exercise Target Goals:    Exercise Program Goal: Individual exercise prescription set with THRR, safety & activity barriers. Participant demonstrates ability to understand and report RPE using BORG scale, to self-measure pulse accurately, and to acknowledge the importance of the exercise prescription.  Exercise Prescription Goal: Starting with aerobic activity 30 plus minutes a day, 3 days per week for initial exercise prescription. Provide home exercise prescription and guidelines that participant acknowledges understanding prior to discharge.  Activity Barriers  & Risk Stratification:   6 Minute Walk:   Oxygen Initial Assessment:   Oxygen Re-Evaluation:   Oxygen Discharge (Final Oxygen Re-Evaluation):   Initial Exercise Prescription:   Perform Capillary Blood Glucose checks as needed.  Exercise Prescription Changes:      Exercise Prescription Changes    Row Name 03/02/17 1100 03/15/17 1800 03/24/17 0948 04/14/17 0957       Response to Exercise   Blood Pressure (Admit) 118/72 122/76 100/78 100/69    Blood Pressure (Exercise) 118/62 134/70 128/70 156/70    Blood Pressure (Exit) 108/60 102/60 103/72 106/64    Heart Rate (Admit) 74 bpm 77 bpm 80 bpm 89 bpm    Heart Rate (Exercise) 116 bpm 121 bpm 126 bpm 134 bpm    Heart Rate (Exit) 73 bpm 77 bpm 85 bpm 87 bpm    Rating of Perceived Exertion (Exercise) _0 Symptoms none none none none    Duration Continue with 30 min of aerobic exercise without signs/symptoms of physical distress. Continue with 30 min of aerobic exercise without signs/symptoms of physical distress. Progress to 45 minutes of aerobic exercise without signs/symptoms of physical distress Progress to 45 minutes of aerobic exercise without signs/symptoms of physical distress    Intensity THRR unchanged THRR unchanged THRR unchanged THRR unchanged      Progression   Progression Continue to progress workloads to maintain intensity without signs/symptoms of physical distress. Continue to progress workloads to maintain intensity without signs/symptoms of physical distress. Continue to progress workloads to maintain intensity without signs/symptoms of physical distress. Continue to progress workloads to maintain intensity without signs/symptoms of physical distress.    Average METs 4.2 4.6 5.5 5.7      Resistance Training   Training Prescription No  No weights this session. Yes  y Yes  y Yes    Weight 3lbs 5lbs 5lbs 5lbs    Reps 10-15 10-15 10-15 10-15    Time 10 Minutes 10 Minutes 10 Minutes 10 Minutes       Interval Training   Interval Training No No No No      Treadmill   MPH 3 3 3.2 3.2    Grade _1 Minutes _2 METs 3.71 3.71 4.33 4.33      Bike   Level 1.7 1._3 Minutes _4 METs 5.85 5.86 6.69 6.81      NuStep   Level _5 SPM 80 80 80 80    Minutes _6 METs 3.1 4.2 5.5 6  Arm Ergometer   Level -  -  -  -    Watts -  -  -  -    Minutes -  -  -  -    METs -  -  -  -      Home Exercise Plan   Plans to continue exercise at  - Home (comment) Home (comment) Home (comment)    Frequency  - Add 3 additional days to program exercise sessions. Add 3 additional days to program exercise sessions. Add 3 additional days to program exercise sessions.    Initial Home Exercises Provided  - 03/05/17 03/05/17 03/05/17       Exercise Comments:      Exercise Comments    Row Name 03/03/17 1455 03/05/17 1105 03/17/17 0950 03/24/17 0948 04/14/17 0945   Exercise Comments Reviewed METs and goals with patient. Reviewed home exercise guidelines with patient. Reviewed MET level with patient. Patient is doing well with exercise. Reviewed METs and goals with patient.      Exercise Goals and Review:   Exercise Goals Re-Evaluation :     Exercise Goals Re-Evaluation    Row Name 03/03/17 1455 03/17/17 0950 03/24/17 0948 04/14/17 0945       Exercise Goal Re-Evaluation   Exercise Goals Review Increase Physical Activity;Increase Strength and Stamina Knowledge and understanding of Target Heart Rate Range (THRR);Able to understand and use rate of perceived exertion (RPE) scale Able to understand and use rate of perceived exertion (RPE) scale;Increase Physical Activity;Understanding of Exercise Prescription Increase Physical Activity    Comments Off to a good start with exercise. Pt has progressed to 4.2 METs.  Patient can verbalize THRR and RPE scale. Pt can verbalize how to count pulse. Patient is making consistent progress with exercise,  averaging 5.5 METs. Patient is gaining confidence with exercise routine, states she's walking without fear.    Expected Outcomes Pt will begin home exercise program in addition to CR. Pt eventually wants to return to [place]. Continue exercise at home and CR to achieve health and fitness goals. Patient will have more confidence with exercise and maintanin regular exercise routine. Continue walking routine with knowledge of safe parameters for exercise.        Discharge Exercise Prescription (Final Exercise Prescription Changes):     Exercise Prescription Changes - 04/14/17 0957      Response to Exercise   Blood Pressure (Admit) 100/69   Blood Pressure (Exercise) 156/70   Blood Pressure (Exit) 106/64   Heart Rate (Admit) 89 bpm   Heart Rate (Exercise) 134 bpm   Heart Rate (Exit) 87 bpm   Rating of Perceived Exertion (Exercise) 12   Symptoms none   Duration Progress to 45 minutes of aerobic exercise without signs/symptoms of physical distress   Intensity THRR unchanged     Progression   Progression Continue to progress workloads to maintain intensity without signs/symptoms of physical distress.   Average METs 5.7     Resistance Training   Training Prescription Yes   Weight 5lbs   Reps 10-15   Time 10 Minutes     Interval Training   Interval Training No     Treadmill   MPH 3.2   Grade 2   Minutes 10   METs 4.33     Bike   Level 2   Minutes 10   METs 6.81     NuStep   Level 6   SPM 80   Minutes 10   METs 6  Home Exercise Plan   Plans to continue exercise at Home (comment)   Frequency Add 3 additional days to program exercise sessions.   Initial Home Exercises Provided 03/05/17      Nutrition:  Target Goals: Understanding of nutrition guidelines, daily intake of sodium <1581m, cholesterol <2036m calories 30% from fat and 7% or less from saturated fats, daily to have 5 or more servings of fruits and vegetables.  Biometrics:    Nutrition Therapy Plan and  Nutrition Goals:   Nutrition Discharge: Nutrition Scores:   Nutrition Goals Re-Evaluation:   Nutrition Goals Re-Evaluation:   Nutrition Goals Discharge (Final Nutrition Goals Re-Evaluation):   Psychosocial: Target Goals: Acknowledge presence or absence of significant depression and/or stress, maximize coping skills, provide positive support system. Participant is able to verbalize types and ability to use techniques and skills needed for reducing stress and depression.  Initial Review & Psychosocial Screening:   Quality of Life Scores:     Quality of Life - 02/24/17 1704      Quality of Life Scores   Health/Function Pre --  dissatisfied with health due to heart problems and sciatica   Socioeconomic Pre --  deines having any financial stressors but worries a lot   Family Pre --  concerned about one of her daughter's family life   GLOBAL Pre --  reivewed with the patient seeing a therapist ona frequent basis      PHQ-9: Recent Review Flowsheet Data    [facility] 02/24/2017   Decreased Interest 0   Down, Depressed, Hopeless 1   PHQ - 2 Score 1     Interpretation of Total Score  Total Score Depression Severity:  1-4 = Minimal depression, 5-9 = Mild depression, 10-14 = Moderate depression, 15-19 = Moderately severe depression, 20-27 = Severe depression   Psychosocial Evaluation and Intervention:   Psychosocial Re-Evaluation:     Psychosocial Re-Evaluation    Ro[place]sterame 03/31/17 1712 04/23/17 1725           Psychosocial Re-Evaluation   Current issues with Current Stress Concerns Current Stress Concerns      Comments AnRoyceees a therapist ona regular basis  AnDelvinaees a therapist ona regular basis       Expected Outcomes Will continue to offer emotional support and resoources as needed Will continue to offer emotional support and resoources as needed      Interventions Encouraged to attend Cardiac Rehabilitation for the exercise Encouraged to attend  Cardiac Rehabilitation for the exercise      Continue Psychosocial Services  No Follow up required No Follow up required        Initial Review   Source of Stress Concerns Family Family         Psychosocial Discharge (Final Psychosocial Re-Evaluation):     Psychosocial Re-Evaluation - 04/23/17 1725      Psychosocial Re-Evaluation   Current issues with Current Stress Concerns   Comments AnJayniaees a therapist ona regular basis    Expected Outcomes Will continue to offer emotional support and resoources as needed   Interventions Encouraged to attend Cardiac Rehabilitation for the exercise   Continue Psychosocial Services  No Follow up required     Initial Review   Source of Stress Concerns Family      Vocational Rehabilitation: Provide vocational rehab assistance to qualifying candidates.   Vocational Rehab Evaluation & Intervention:   Education: Education Goals: Education classes will be provided on a weekly basis, covering required topics. Participant  will state understanding/return demonstration of topics presented.  Learning Barriers/Preferences:   Education Topics: Count Your Pulse:  -Group instruction provided by verbal instruction, demonstration, patient participation and written materials to support subject.  Instructors address importance of being able to find your pulse and how to count your pulse when at home without a heart monitor.  Patients get hands on experience counting their pulse with staff help and individually.   Heart Attack, Angina, and Risk Factor Modification:  -Group instruction provided by verbal instruction, video, and written materials to support subject.  Instructors address signs and symptoms of angina and heart attacks.    Also discuss risk factors for heart disease and how to make changes to improve heart health risk factors.   Functional Fitness:  -Group instruction provided by verbal instruction, demonstration, patient participation, and  written materials to support subject.  Instructors address safety measures for doing things around the house.  Discuss how to get up and down off the floor, how to pick things up properly, how to safely get out of a chair without assistance, and balance training.   Meditation and Mindfulness:  -Group instruction provided by verbal instruction, patient participation, and written materials to support subject.  Instructor addresses importance of mindfulness and meditation practice to help reduce stress and improve awareness.  Instructor also leads participants through a meditation exercise.    Stretching for Flexibility and Mobility:  -Group instruction provided by verbal instruction, patient participation, and written materials to support subject.  Instructors lead participants through series of stretches that are designed to increase flexibility thus improving mobility.  These stretches are additional exercise for major muscle groups that are typically performed during regular warm up and cool down.   Hands Only CPR:  -Group verbal, video, and participation provides a basic overview of AHA guidelines for community CPR. Role-play of emergencies allow participants the opportunity to practice calling for help and chest compression technique with discussion of AED use.   Hypertension: -Group verbal and written instruction that provides a basic overview of hypertension including the most recent diagnostic guidelines, risk factor reduction with self-care instructions and medication management.    Nutrition I class: Heart Healthy Eating:  -Group instruction provided by PowerPoint slides, verbal discussion, and written materials to support subject matter. The instructor gives an explanation and review of the Therapeutic Lifestyle Changes diet recommendations, which includes a discussion on lipid goals, dietary fat, sodium, fiber, plant stanol/sterol esters, sugar, and the components of a well-balanced,  healthy diet.   CARDIAC REHAB PHASE II ORIENTATION from 02/18/2017 in [place]  Date  02/18/17  Educator  RD  Instruction Review Code  Not applicable      Nutrition II class: Lifestyle Skills:  -Group instruction provided by PowerPoint slides, verbal discussion, and written materials to support subject matter. The instructor gives an explanation and review of label reading, grocery shopping for heart health, heart healthy recipe modifications, and ways to make healthier choices when eating out.   CARDIAC REHAB PHASE II ORIENTATION from 02/18/2017 in [place]  Date  02/18/17  Educator  RD  Instruction Review Code  Not applicable      Diabetes Question & Answer:  -Group instruction provided by PowerPoint slides, verbal discussion, and written materials to support subject matter. The instructor gives an explanation and review of diabetes co-morbidities, pre- and post-prandial blood glucose goals, pre-exercise blood glucose goals, signs, symptoms, and treatment of hypoglycemia and hyperglycemia, and foot  care basics.   Diabetes Blitz:  -Group instruction provided by PowerPoint slides, verbal discussion, and written materials to support subject matter. The instructor gives an explanation and review of the physiology behind type 1 and type 2 diabetes, diabetes medications and rational behind using different medications, pre- and post-prandial blood glucose recommendations and Hemoglobin A1c goals, diabetes diet, and exercise including blood glucose guidelines for exercising safely.    Portion Distortion:  -Group instruction provided by PowerPoint slides, verbal discussion, written materials, and food models to support subject matter. The instructor gives an explanation of serving size versus portion size, changes in portions sizes over the last 20 years, and what consists of a serving from each food group.   Stress Management:   -Group instruction provided by verbal instruction, video, and written materials to support subject matter.  Instructors review role of stress in heart disease and how to cope with stress positively.     Exercising on Your Own:  -Group instruction provided by verbal instruction, power point, and written materials to support subject.  Instructors discuss benefits of exercise, components of exercise, frequency and intensity of exercise, and end points for exercise.  Also discuss use of nitroglycerin and activating EMS.  Review options of places to exercise outside of rehab.  Review guidelines for sex with heart disease.   Cardiac Drugs I:  -Group instruction provided by verbal instruction and written materials to support subject.  Instructor reviews cardiac drug classes: antiplatelets, anticoagulants, beta blockers, and statins.  Instructor discusses reasons, side effects, and lifestyle considerations for each drug class.   Cardiac Drugs II:  -Group instruction provided by verbal instruction and written materials to support subject.  Instructor reviews cardiac drug classes: angiotensin converting enzyme inhibitors (ACE-I), angiotensin II receptor blockers (ARBs), nitrates, and calcium channel blockers.  Instructor discusses reasons, side effects, and lifestyle considerations for each drug class.   Anatomy and Physiology of the Circulatory System:  Group verbal and written instruction and models provide basic cardiac anatomy and physiology, with the coronary electrical and arterial systems. Review of: AMI, Angina, Valve disease, Heart Failure, Peripheral Artery Disease, Cardiac Arrhythmia, Pacemakers, and the ICD.   Other Education:  -Group or individual verbal, written, or video instructions that support the educational goals of the cardiac rehab program.   Knowledge Questionnaire Score:   Core Components/Risk Factors/Patient Goals at Admission:   Core Components/Risk Factors/Patient  Goals Review:      Goals and Risk Factor Review    Row Name 03/31/17 1710 04/23/17 1725           Core Components/Risk Factors/Patient Goals Review   Personal Goals Review Hypertension;Lipids;Stress Hypertension;Lipids;Stress      Review Sharri's vital signs have been stable at cardiac rehab. Esli is doing well with exercise Eboni's vital signs have been stable at cardiac rehab. Kynlee is doing well with exercise      Expected Outcomes Arwen will continue to take her medicaitons as presribed and continue to exercise at cardiac rehab Shevon will continue to take her medicaitons as presribed and continue to exercise at cardiac rehab         Core Components/Risk Factors/Patient Goals at Discharge (Final Review):      Goals and Risk Factor Review - 04/23/17 1725      Core Components/Risk Factors/Patient Goals Review   Personal Goals Review Hypertension;Lipids;Stress   Review Aveline's vital signs have been stable at cardiac rehab. Jaquitta is doing well with exercise   Expected Outcomes Sueanne will continue to take  her medicaitons as presribed and continue to exercise at cardiac rehab      ITP Comments:   Comments: Josalin is making expected progress toward personal goals after completing 16 sessions. Recommend continued exercise and life style modification education including  stress management and relaxation techniques to decrease cardiac risk profile. Nurah is enjoying participating in phase 2 cardiac rehab. Danyel will be out until next week. Arlethia is vacationing with her friends.Barnet Pall, RN,BSN 04/23/2017 5:28 PM

## 2017-04-26 ENCOUNTER — Encounter (HOSPITAL_COMMUNITY)
Admission: RE | Admit: 2017-04-26 | Discharge: 2017-04-26 | Disposition: A | Discharge status: Home / Self Care | Payer: BLUE CROSS/BLUE SHIELD | Source: Ambulatory Visit | Attending: Cardiology | Admitting: Cardiology

## 2017-04-26 DIAGNOSIS — I2510 Atherosclerotic heart disease of native coronary artery without angina pectoris: Secondary | ICD-10-CM | POA: Diagnosis not present

## 2017-04-26 DIAGNOSIS — M797 Fibromyalgia: Secondary | ICD-10-CM | POA: Diagnosis not present

## 2017-04-26 DIAGNOSIS — E039 Hypothyroidism, unspecified: Secondary | ICD-10-CM | POA: Diagnosis not present

## 2017-04-26 DIAGNOSIS — Z7982 Long term (current) use of aspirin: Secondary | ICD-10-CM | POA: Diagnosis not present

## 2017-04-26 DIAGNOSIS — E559 Vitamin D deficiency, unspecified: Secondary | ICD-10-CM | POA: Insufficient documentation

## 2017-04-26 DIAGNOSIS — G4700 Insomnia, unspecified: Secondary | ICD-10-CM | POA: Diagnosis not present

## 2017-04-26 DIAGNOSIS — Z79899 Other long term (current) drug therapy: Secondary | ICD-10-CM | POA: Diagnosis not present

## 2017-04-26 DIAGNOSIS — F329 Major depressive disorder, single episode, unspecified: Secondary | ICD-10-CM | POA: Diagnosis not present

## 2017-04-26 DIAGNOSIS — E785 Hyperlipidemia, unspecified: Secondary | ICD-10-CM | POA: Diagnosis not present

## 2017-04-26 DIAGNOSIS — Z7902 Long term (current) use of antithrombotics/antiplatelets: Secondary | ICD-10-CM | POA: Insufficient documentation

## 2017-04-26 DIAGNOSIS — I10 Essential (primary) hypertension: Secondary | ICD-10-CM | POA: Diagnosis not present

## 2017-04-26 DIAGNOSIS — K219 Gastro-esophageal reflux disease without esophagitis: Secondary | ICD-10-CM | POA: Insufficient documentation

## 2017-04-26 DIAGNOSIS — C8190 Hodgkin lymphoma, unspecified, unspecified site: Secondary | ICD-10-CM | POA: Insufficient documentation

## 2017-04-26 DIAGNOSIS — Z955 Presence of coronary angioplasty implant and graft: Secondary | ICD-10-CM

## 2017-04-28 ENCOUNTER — Encounter (HOSPITAL_COMMUNITY)
Admission: RE | Admit: 2017-04-28 | Discharge: 2017-04-28 | Disposition: A | Discharge status: Home / Self Care | Payer: BLUE CROSS/BLUE SHIELD | Source: Ambulatory Visit | Attending: Cardiology | Admitting: Cardiology

## 2017-04-28 DIAGNOSIS — C8190 Hodgkin lymphoma, unspecified, unspecified site: Secondary | ICD-10-CM | POA: Diagnosis not present

## 2017-04-28 DIAGNOSIS — F329 Major depressive disorder, single episode, unspecified: Secondary | ICD-10-CM | POA: Diagnosis not present

## 2017-04-28 DIAGNOSIS — K219 Gastro-esophageal reflux disease without esophagitis: Secondary | ICD-10-CM | POA: Diagnosis not present

## 2017-04-28 DIAGNOSIS — Z955 Presence of coronary angioplasty implant and graft: Secondary | ICD-10-CM

## 2017-04-28 DIAGNOSIS — E785 Hyperlipidemia, unspecified: Secondary | ICD-10-CM | POA: Diagnosis not present

## 2017-04-28 DIAGNOSIS — I10 Essential (primary) hypertension: Secondary | ICD-10-CM | POA: Diagnosis not present

## 2017-04-28 DIAGNOSIS — M797 Fibromyalgia: Secondary | ICD-10-CM | POA: Diagnosis not present

## 2017-04-28 DIAGNOSIS — Z7982 Long term (current) use of aspirin: Secondary | ICD-10-CM | POA: Diagnosis not present

## 2017-04-28 DIAGNOSIS — Z7902 Long term (current) use of antithrombotics/antiplatelets: Secondary | ICD-10-CM | POA: Diagnosis not present

## 2017-04-28 DIAGNOSIS — E039 Hypothyroidism, unspecified: Secondary | ICD-10-CM | POA: Diagnosis not present

## 2017-04-28 DIAGNOSIS — G4700 Insomnia, unspecified: Secondary | ICD-10-CM | POA: Diagnosis not present

## 2017-04-28 DIAGNOSIS — I2510 Atherosclerotic heart disease of native coronary artery without angina pectoris: Secondary | ICD-10-CM | POA: Diagnosis not present

## 2017-04-28 DIAGNOSIS — E559 Vitamin D deficiency, unspecified: Secondary | ICD-10-CM | POA: Diagnosis not present

## 2017-04-28 DIAGNOSIS — Z79899 Other long term (current) drug therapy: Secondary | ICD-10-CM | POA: Diagnosis not present

## 2017-04-30 ENCOUNTER — Encounter (HOSPITAL_COMMUNITY): Payer: BLUE CROSS/BLUE SHIELD

## 2017-05-03 ENCOUNTER — Encounter (HOSPITAL_COMMUNITY)
Admission: RE | Admit: 2017-05-03 | Discharge: 2017-05-03 | Disposition: A | Discharge status: Home / Self Care | Payer: BLUE CROSS/BLUE SHIELD | Source: Ambulatory Visit | Attending: Cardiology | Admitting: Cardiology

## 2017-05-03 DIAGNOSIS — F329 Major depressive disorder, single episode, unspecified: Secondary | ICD-10-CM | POA: Diagnosis not present

## 2017-05-03 DIAGNOSIS — E039 Hypothyroidism, unspecified: Secondary | ICD-10-CM | POA: Diagnosis not present

## 2017-05-03 DIAGNOSIS — Z955 Presence of coronary angioplasty implant and graft: Secondary | ICD-10-CM

## 2017-05-03 DIAGNOSIS — I10 Essential (primary) hypertension: Secondary | ICD-10-CM | POA: Diagnosis not present

## 2017-05-03 DIAGNOSIS — M9903 Segmental and somatic dysfunction of lumbar region: Secondary | ICD-10-CM | POA: Diagnosis not present

## 2017-05-03 DIAGNOSIS — Z7982 Long term (current) use of aspirin: Secondary | ICD-10-CM | POA: Diagnosis not present

## 2017-05-03 DIAGNOSIS — C8190 Hodgkin lymphoma, unspecified, unspecified site: Secondary | ICD-10-CM | POA: Diagnosis not present

## 2017-05-03 DIAGNOSIS — M797 Fibromyalgia: Secondary | ICD-10-CM | POA: Diagnosis not present

## 2017-05-03 DIAGNOSIS — M5431 Sciatica, right side: Secondary | ICD-10-CM | POA: Diagnosis not present

## 2017-05-03 DIAGNOSIS — M9901 Segmental and somatic dysfunction of cervical region: Secondary | ICD-10-CM | POA: Diagnosis not present

## 2017-05-03 DIAGNOSIS — Z79899 Other long term (current) drug therapy: Secondary | ICD-10-CM | POA: Diagnosis not present

## 2017-05-03 DIAGNOSIS — I2510 Atherosclerotic heart disease of native coronary artery without angina pectoris: Secondary | ICD-10-CM | POA: Diagnosis not present

## 2017-05-03 DIAGNOSIS — E785 Hyperlipidemia, unspecified: Secondary | ICD-10-CM | POA: Diagnosis not present

## 2017-05-03 DIAGNOSIS — E559 Vitamin D deficiency, unspecified: Secondary | ICD-10-CM | POA: Diagnosis not present

## 2017-05-03 DIAGNOSIS — G4700 Insomnia, unspecified: Secondary | ICD-10-CM | POA: Diagnosis not present

## 2017-05-03 DIAGNOSIS — K219 Gastro-esophageal reflux disease without esophagitis: Secondary | ICD-10-CM | POA: Diagnosis not present

## 2017-05-03 DIAGNOSIS — Z7902 Long term (current) use of antithrombotics/antiplatelets: Secondary | ICD-10-CM | POA: Diagnosis not present

## 2017-05-05 ENCOUNTER — Encounter (HOSPITAL_COMMUNITY)
Admission: RE | Admit: 2017-05-05 | Discharge: 2017-05-05 | Disposition: A | Discharge status: Home / Self Care | Payer: BLUE CROSS/BLUE SHIELD | Source: Ambulatory Visit | Attending: Cardiology | Admitting: Cardiology

## 2017-05-05 DIAGNOSIS — K219 Gastro-esophageal reflux disease without esophagitis: Secondary | ICD-10-CM | POA: Diagnosis not present

## 2017-05-05 DIAGNOSIS — I2510 Atherosclerotic heart disease of native coronary artery without angina pectoris: Secondary | ICD-10-CM | POA: Diagnosis not present

## 2017-05-05 DIAGNOSIS — G4700 Insomnia, unspecified: Secondary | ICD-10-CM | POA: Diagnosis not present

## 2017-05-05 DIAGNOSIS — Z955 Presence of coronary angioplasty implant and graft: Secondary | ICD-10-CM

## 2017-05-05 DIAGNOSIS — F329 Major depressive disorder, single episode, unspecified: Secondary | ICD-10-CM | POA: Diagnosis not present

## 2017-05-05 DIAGNOSIS — Z7902 Long term (current) use of antithrombotics/antiplatelets: Secondary | ICD-10-CM | POA: Diagnosis not present

## 2017-05-05 DIAGNOSIS — M797 Fibromyalgia: Secondary | ICD-10-CM | POA: Diagnosis not present

## 2017-05-05 DIAGNOSIS — M9901 Segmental and somatic dysfunction of cervical region: Secondary | ICD-10-CM | POA: Diagnosis not present

## 2017-05-05 DIAGNOSIS — E559 Vitamin D deficiency, unspecified: Secondary | ICD-10-CM | POA: Diagnosis not present

## 2017-05-05 DIAGNOSIS — M9903 Segmental and somatic dysfunction of lumbar region: Secondary | ICD-10-CM | POA: Diagnosis not present

## 2017-05-05 DIAGNOSIS — Z7982 Long term (current) use of aspirin: Secondary | ICD-10-CM | POA: Diagnosis not present

## 2017-05-05 DIAGNOSIS — E785 Hyperlipidemia, unspecified: Secondary | ICD-10-CM | POA: Diagnosis not present

## 2017-05-05 DIAGNOSIS — I10 Essential (primary) hypertension: Secondary | ICD-10-CM | POA: Diagnosis not present

## 2017-05-05 DIAGNOSIS — C8190 Hodgkin lymphoma, unspecified, unspecified site: Secondary | ICD-10-CM | POA: Diagnosis not present

## 2017-05-05 DIAGNOSIS — M5431 Sciatica, right side: Secondary | ICD-10-CM | POA: Diagnosis not present

## 2017-05-05 DIAGNOSIS — E039 Hypothyroidism, unspecified: Secondary | ICD-10-CM | POA: Diagnosis not present

## 2017-05-05 DIAGNOSIS — Z79899 Other long term (current) drug therapy: Secondary | ICD-10-CM | POA: Diagnosis not present

## 2017-05-06 DIAGNOSIS — M5431 Sciatica, right side: Secondary | ICD-10-CM | POA: Diagnosis not present

## 2017-05-06 DIAGNOSIS — M9901 Segmental and somatic dysfunction of cervical region: Secondary | ICD-10-CM | POA: Diagnosis not present

## 2017-05-06 DIAGNOSIS — F331 Major depressive disorder, recurrent, moderate: Secondary | ICD-10-CM | POA: Diagnosis not present

## 2017-05-06 DIAGNOSIS — M9903 Segmental and somatic dysfunction of lumbar region: Secondary | ICD-10-CM | POA: Diagnosis not present

## 2017-05-07 ENCOUNTER — Encounter (HOSPITAL_COMMUNITY)
Admission: RE | Admit: 2017-05-07 | Discharge: 2017-05-07 | Disposition: A | Discharge status: Home / Self Care | Payer: BLUE CROSS/BLUE SHIELD | Source: Ambulatory Visit | Attending: Cardiology | Admitting: Cardiology

## 2017-05-07 DIAGNOSIS — M797 Fibromyalgia: Secondary | ICD-10-CM | POA: Diagnosis not present

## 2017-05-07 DIAGNOSIS — I2510 Atherosclerotic heart disease of native coronary artery without angina pectoris: Secondary | ICD-10-CM | POA: Diagnosis not present

## 2017-05-07 DIAGNOSIS — E559 Vitamin D deficiency, unspecified: Secondary | ICD-10-CM | POA: Diagnosis not present

## 2017-05-07 DIAGNOSIS — Z79899 Other long term (current) drug therapy: Secondary | ICD-10-CM | POA: Diagnosis not present

## 2017-05-07 DIAGNOSIS — Z7902 Long term (current) use of antithrombotics/antiplatelets: Secondary | ICD-10-CM | POA: Diagnosis not present

## 2017-05-07 DIAGNOSIS — G4700 Insomnia, unspecified: Secondary | ICD-10-CM | POA: Diagnosis not present

## 2017-05-07 DIAGNOSIS — K219 Gastro-esophageal reflux disease without esophagitis: Secondary | ICD-10-CM | POA: Diagnosis not present

## 2017-05-07 DIAGNOSIS — Z7982 Long term (current) use of aspirin: Secondary | ICD-10-CM | POA: Diagnosis not present

## 2017-05-07 DIAGNOSIS — C8190 Hodgkin lymphoma, unspecified, unspecified site: Secondary | ICD-10-CM | POA: Diagnosis not present

## 2017-05-07 DIAGNOSIS — E039 Hypothyroidism, unspecified: Secondary | ICD-10-CM | POA: Diagnosis not present

## 2017-05-07 DIAGNOSIS — F329 Major depressive disorder, single episode, unspecified: Secondary | ICD-10-CM | POA: Diagnosis not present

## 2017-05-07 DIAGNOSIS — I10 Essential (primary) hypertension: Secondary | ICD-10-CM | POA: Diagnosis not present

## 2017-05-07 DIAGNOSIS — Z955 Presence of coronary angioplasty implant and graft: Secondary | ICD-10-CM | POA: Diagnosis not present

## 2017-05-07 DIAGNOSIS — E785 Hyperlipidemia, unspecified: Secondary | ICD-10-CM | POA: Diagnosis not present

## 2017-05-10 ENCOUNTER — Encounter (HOSPITAL_COMMUNITY)
Admission: RE | Admit: 2017-05-10 | Discharge: 2017-05-10 | Disposition: A | Discharge status: Home / Self Care | Payer: BLUE CROSS/BLUE SHIELD | Source: Ambulatory Visit | Attending: Cardiology | Admitting: Cardiology

## 2017-05-10 DIAGNOSIS — Z955 Presence of coronary angioplasty implant and graft: Secondary | ICD-10-CM

## 2017-05-10 DIAGNOSIS — M9901 Segmental and somatic dysfunction of cervical region: Secondary | ICD-10-CM | POA: Diagnosis not present

## 2017-05-10 DIAGNOSIS — G4700 Insomnia, unspecified: Secondary | ICD-10-CM | POA: Diagnosis not present

## 2017-05-10 DIAGNOSIS — Z79899 Other long term (current) drug therapy: Secondary | ICD-10-CM | POA: Diagnosis not present

## 2017-05-10 DIAGNOSIS — I2510 Atherosclerotic heart disease of native coronary artery without angina pectoris: Secondary | ICD-10-CM | POA: Diagnosis not present

## 2017-05-10 DIAGNOSIS — E039 Hypothyroidism, unspecified: Secondary | ICD-10-CM | POA: Diagnosis not present

## 2017-05-10 DIAGNOSIS — M9903 Segmental and somatic dysfunction of lumbar region: Secondary | ICD-10-CM | POA: Diagnosis not present

## 2017-05-10 DIAGNOSIS — M797 Fibromyalgia: Secondary | ICD-10-CM | POA: Diagnosis not present

## 2017-05-10 DIAGNOSIS — Z7982 Long term (current) use of aspirin: Secondary | ICD-10-CM | POA: Diagnosis not present

## 2017-05-10 DIAGNOSIS — Z7902 Long term (current) use of antithrombotics/antiplatelets: Secondary | ICD-10-CM | POA: Diagnosis not present

## 2017-05-10 DIAGNOSIS — M5431 Sciatica, right side: Secondary | ICD-10-CM | POA: Diagnosis not present

## 2017-05-10 DIAGNOSIS — E785 Hyperlipidemia, unspecified: Secondary | ICD-10-CM | POA: Diagnosis not present

## 2017-05-10 DIAGNOSIS — F329 Major depressive disorder, single episode, unspecified: Secondary | ICD-10-CM | POA: Diagnosis not present

## 2017-05-10 DIAGNOSIS — I10 Essential (primary) hypertension: Secondary | ICD-10-CM | POA: Diagnosis not present

## 2017-05-10 DIAGNOSIS — K219 Gastro-esophageal reflux disease without esophagitis: Secondary | ICD-10-CM | POA: Diagnosis not present

## 2017-05-10 DIAGNOSIS — C8190 Hodgkin lymphoma, unspecified, unspecified site: Secondary | ICD-10-CM | POA: Diagnosis not present

## 2017-05-10 DIAGNOSIS — E559 Vitamin D deficiency, unspecified: Secondary | ICD-10-CM | POA: Diagnosis not present

## 2017-05-11 DIAGNOSIS — M9903 Segmental and somatic dysfunction of lumbar region: Secondary | ICD-10-CM | POA: Diagnosis not present

## 2017-05-11 DIAGNOSIS — M9901 Segmental and somatic dysfunction of cervical region: Secondary | ICD-10-CM | POA: Diagnosis not present

## 2017-05-11 DIAGNOSIS — M5431 Sciatica, right side: Secondary | ICD-10-CM | POA: Diagnosis not present

## 2017-05-12 ENCOUNTER — Encounter (HOSPITAL_COMMUNITY): Payer: BLUE CROSS/BLUE SHIELD

## 2017-05-14 ENCOUNTER — Encounter (HOSPITAL_COMMUNITY): Payer: BLUE CROSS/BLUE SHIELD

## 2017-05-17 ENCOUNTER — Encounter (HOSPITAL_COMMUNITY)
Admission: RE | Admit: 2017-05-17 | Discharge: 2017-05-17 | Disposition: A | Discharge status: Home / Self Care | Payer: BLUE CROSS/BLUE SHIELD | Source: Ambulatory Visit | Attending: Cardiology | Admitting: Cardiology

## 2017-05-17 DIAGNOSIS — M9901 Segmental and somatic dysfunction of cervical region: Secondary | ICD-10-CM | POA: Diagnosis not present

## 2017-05-17 DIAGNOSIS — E039 Hypothyroidism, unspecified: Secondary | ICD-10-CM | POA: Diagnosis not present

## 2017-05-17 DIAGNOSIS — F329 Major depressive disorder, single episode, unspecified: Secondary | ICD-10-CM | POA: Diagnosis not present

## 2017-05-17 DIAGNOSIS — K219 Gastro-esophageal reflux disease without esophagitis: Secondary | ICD-10-CM | POA: Diagnosis not present

## 2017-05-17 DIAGNOSIS — I10 Essential (primary) hypertension: Secondary | ICD-10-CM | POA: Diagnosis not present

## 2017-05-17 DIAGNOSIS — M797 Fibromyalgia: Secondary | ICD-10-CM | POA: Diagnosis not present

## 2017-05-17 DIAGNOSIS — I2510 Atherosclerotic heart disease of native coronary artery without angina pectoris: Secondary | ICD-10-CM | POA: Diagnosis not present

## 2017-05-17 DIAGNOSIS — Z79899 Other long term (current) drug therapy: Secondary | ICD-10-CM | POA: Diagnosis not present

## 2017-05-17 DIAGNOSIS — E559 Vitamin D deficiency, unspecified: Secondary | ICD-10-CM | POA: Diagnosis not present

## 2017-05-17 DIAGNOSIS — G4700 Insomnia, unspecified: Secondary | ICD-10-CM | POA: Diagnosis not present

## 2017-05-17 DIAGNOSIS — E785 Hyperlipidemia, unspecified: Secondary | ICD-10-CM | POA: Diagnosis not present

## 2017-05-17 DIAGNOSIS — M9903 Segmental and somatic dysfunction of lumbar region: Secondary | ICD-10-CM | POA: Diagnosis not present

## 2017-05-17 DIAGNOSIS — Z7982 Long term (current) use of aspirin: Secondary | ICD-10-CM | POA: Diagnosis not present

## 2017-05-17 DIAGNOSIS — Z7902 Long term (current) use of antithrombotics/antiplatelets: Secondary | ICD-10-CM | POA: Diagnosis not present

## 2017-05-17 DIAGNOSIS — Z955 Presence of coronary angioplasty implant and graft: Secondary | ICD-10-CM

## 2017-05-17 DIAGNOSIS — C8190 Hodgkin lymphoma, unspecified, unspecified site: Secondary | ICD-10-CM | POA: Diagnosis not present

## 2017-05-17 DIAGNOSIS — M5431 Sciatica, right side: Secondary | ICD-10-CM | POA: Diagnosis not present

## 2017-05-18 DIAGNOSIS — M5431 Sciatica, right side: Secondary | ICD-10-CM | POA: Diagnosis not present

## 2017-05-18 DIAGNOSIS — M9901 Segmental and somatic dysfunction of cervical region: Secondary | ICD-10-CM | POA: Diagnosis not present

## 2017-05-18 DIAGNOSIS — M9903 Segmental and somatic dysfunction of lumbar region: Secondary | ICD-10-CM | POA: Diagnosis not present

## 2017-05-19 ENCOUNTER — Encounter (HOSPITAL_COMMUNITY): Payer: BLUE CROSS/BLUE SHIELD

## 2017-05-21 ENCOUNTER — Encounter (HOSPITAL_COMMUNITY): Payer: Self-pay | Admitting: *Deleted

## 2017-05-21 ENCOUNTER — Encounter (HOSPITAL_COMMUNITY): Payer: BLUE CROSS/BLUE SHIELD

## 2017-05-21 DIAGNOSIS — Z955 Presence of coronary angioplasty implant and graft: Secondary | ICD-10-CM

## 2017-05-21 NOTE — Progress Notes (Signed)
Cardiac Individual Treatment Plan  Patient Details  Name: Cynthia Poole MRN: [id number] Date of Birth: April 21, 1956 Referring Provider:     CARDIAC REHAB PHASE II ORIENTATION from 02/18/2017 in [place]  Referring Provider  Kela Millin MD      Initial Encounter Date:    CARDIAC REHAB PHASE II ORIENTATION from 02/18/2017 in [place]  Date  02/18/17  Referring Provider  Kela Millin MD      Visit Diagnosis: 09/29/16 Stented coronary artery  Patient's Home Medications on Admission:  Current Outpatient Medications:  .  ALPRAZolam (XANAX) 1 MG tablet, Take 1 tablet by mouth at bedtime. , Disp: , Rfl:  .  aspirin EC 81 MG EC tablet, Take 1 tablet (81 mg total) by mouth daily., Disp: , Rfl:  .  atorvastatin (LIPITOR) 80 MG tablet, Take 1 tablet (80 mg total) by mouth daily at 6 PM. (Patient taking differently: Take 40 mg by mouth daily at 6 PM. ), Disp: 30 tablet, Rfl: 1 .  Cholecalciferol (VITAMIN D) 2000 UNITS CAPS, Take 1 capsule by mouth daily.  , Disp: , Rfl:  .  clopidogrel (PLAVIX) 75 MG tablet, Take 75 mg by mouth daily., Disp: , Rfl:  .  estradiol (MINIVELLE) 0.05 MG/24HR patch, Place 1 patch onto the skin 2 (two) times a week. Sunday & Wednesday, Disp: , Rfl:  .  estradiol (VIVELLE-DOT) 0.05 MG/24HR, Place 1 patch onto the skin 2 (two) times a week. , Disp: , Rfl:  .  hydrOXYzine (ATARAX/VISTARIL) 25 MG tablet, Take 25 mg by mouth at bedtime., Disp: , Rfl:  .  magnesium 30 MG tablet, Take 30 mg by mouth daily., Disp: , Rfl:  .  nitroGLYCERIN (NITROSTAT) 0.4 MG SL tablet, Place 1 tablet (0.4 mg total) under the tongue every 5 (five) minutes x 3 doses as needed for chest pain., Disp: 25 tablet, Rfl: 2 .  propranolol ER (INDERAL LA) 80 MG 24 hr capsule, Take 1 capsule (80 mg total) by mouth daily. (Patient taking differently: Take 20 mg by mouth 2 (two) times daily. ), Disp: 30 capsule, Rfl: 3  Past  Medical History: Past Medical History:  Diagnosis Date  . Anemia   . CAD in native artery 09/29/2016   Coronary angiogram 7/37/10:GYIRSWN LV systolic function, EF 46%. Left dominant circulation. Mild disease in the ramus and LAD. Circumflex midsegment  90% by IVUS S/P stenting 4.0 x 18 mm Onyx DES.  . DDD (degenerative disc disease), cervical   . DDD (degenerative disc disease), lumbar   . Depression   . Fibromyalgia 03/2004  . GERD (gastroesophageal reflux disease)   . Heart murmur   . History of pleurisy   . HLD (hyperlipidemia)   . Hodgkin's disease 1997    S/P Chemo, Chest RT and splenectomy  . Hypertension   . Hypothyroidism   . Insomnia   . Internal and external bleeding hemorrhoids   . Migraines   . MVP (mitral valve prolapse)   . Osteopenia   . Personal history of colonic adenoma 06/29/2011   06/29/2011 - diminutive adenoma  . Seasonal allergies   . Vitamin D deficiency     Tobacco Use: Social History   Tobacco Use  Smoking Status Never Smoker  Smokeless Tobacco Never Used    Labs: Recent Review Scientist, physiological    Labs for ITP Cardiac and Pulmonary Rehab Latest Ref Rng & Units 09/29/2016 09/30/2016   Cholestrol 0 - 200  mg/dL 225(H) -   LDLCALC 0 - 99 mg/dL 136(H) -   HDL >40 mg/dL 71 -   Trlycerides <150 mg/dL 88 -   Hemoglobin A1c 4.8 - 5.6 % - 5.5      Capillary Blood Glucose: No results found for: GLUCAP   Exercise Target Goals:    Exercise Program Goal: Individual exercise prescription set with THRR, safety & activity barriers. Participant demonstrates ability to understand and report RPE using BORG scale, to self-measure pulse accurately, and to acknowledge the importance of the exercise prescription.  Exercise Prescription Goal: Starting with aerobic activity 30 plus minutes a day, 3 days per week for initial exercise prescription. Provide home exercise prescription and guidelines that participant acknowledges understanding prior to  discharge.  Activity Barriers & Risk Stratification:   6 Minute Walk:   Oxygen Initial Assessment:   Oxygen Re-Evaluation:   Oxygen Discharge (Final Oxygen Re-Evaluation):   Initial Exercise Prescription:   Perform Capillary Blood Glucose checks as needed.  Exercise Prescription Changes:  Exercise Prescription Changes    Row Name 03/24/17 0948 04/14/17 0957 05/10/17 0951 05/17/17 0950       Response to Exercise   Blood Pressure (Admit)  100/78  100/69  122/76  114/74    Blood Pressure (Exercise)  128/70  156/70  180/72  132/64    Blood Pressure (Exit)  103/72  106/64  108/72  104/64    Heart Rate (Admit)  80 bpm  89 bpm  95 bpm  83 bpm    Heart Rate (Exercise)  126 bpm  134 bpm  153 bpm  136 bpm    Heart Rate (Exit)  85 bpm  87 bpm  93 bpm  83 bpm    Rating of Perceived Exertion (Exercise)  13  12  12  13     Symptoms  none  none  none  none    Duration  Progress to 45 minutes of aerobic exercise without signs/symptoms of physical distress  Progress to 45 minutes of aerobic exercise without signs/symptoms of physical distress  Progress to 45 minutes of aerobic exercise without signs/symptoms of physical distress  Progress to 45 minutes of aerobic exercise without signs/symptoms of physical distress    Intensity  THRR unchanged  THRR unchanged  THRR unchanged  THRR New 64-151      Progression   Progression  Continue to progress workloads to maintain intensity without signs/symptoms of physical distress.  Continue to progress workloads to maintain intensity without signs/symptoms of physical distress.  Continue to progress workloads to maintain intensity without signs/symptoms of physical distress.  Continue to progress workloads to maintain intensity without signs/symptoms of physical distress.    Average METs  5.5  5.7  5.7  6.7      Resistance Training   Training Prescription  Yes y  Yes  Yes  Yes    Weight  5lbs  5lbs  5lbs  5lbs    Reps  10-15  10-15  10-15  10-15     Time  10 Minutes  10 Minutes  10 Minutes  10 Minutes      Interval Training   Interval Training  No  No  No  No      Treadmill   MPH  3.2  3.2  3.2  3.4    Grade  2  2  4  5     Minutes  10  10  10  10     METs  4.33  4.33  5.25  5.95      Bike   Level  2  2  2  2     Minutes  10  10  10  10     METs  6.69  6.81  6.67  6.67      NuStep   Level  6  6  6  6     SPM  80  80  80  80    Minutes  10  10  10  10     METs  5.5  6  5.1  7.4      Home Exercise Plan   Plans to continue exercise at  Home (comment)  Home (comment)  Home (comment)  Home (comment)    Frequency  Add 3 additional days to program exercise sessions.  Add 3 additional days to program exercise sessions.  Add 3 additional days to program exercise sessions.  Add 3 additional days to program exercise sessions.    Initial Home Exercises Provided  03/05/17  03/05/17  03/05/17  03/05/17       Exercise Comments:  Exercise Comments    Row Name 03/24/17 0948 04/14/17 0945 05/10/17 0951 05/17/17 0950     Exercise Comments  Patient is doing well with exercise.  Reviewed METs and goals with patient.  Reviewed goals with patient. Received OK from Dr. Einar Gip to increase THRR to 64-151 bpm.  Reviewed METs with patient.        Exercise Goals and Review:   Exercise Goals Re-Evaluation : Exercise Goals Re-Evaluation    Row Name 03/24/17 0948 04/14/17 0945 05/10/17 0951         Exercise Goal Re-Evaluation   Exercise Goals Review  Able to understand and use rate of perceived exertion (RPE) scale;Increase Physical Activity;Understanding of Exercise Prescription  Increase Physical Activity  Increase Physical Activity;Knowledge and understanding of Target Heart Rate Range (THRR)     Comments  Patient is making consistent progress with exercise, averaging 5.5 METs.  Patient is gaining confidence with exercise routine, states she's walking without fear.  Patient is walking regularly but still experiences SOB when going up hills and  inclines. Patient will try increasing incline on treadmill to help with conditioning and decrease  SOB. Received clearance from cardiologist to increase THRR to 64-151, pt aware.     Expected Outcomes  Patient will have more confidence with exercise and maintanin regular exercise routine.  Continue walking routine with knowledge of safe parameters for exercise.  Patient will increase incline on TM at cardiac rehab to help with conditioning.         Discharge Exercise Prescription (Final Exercise Prescription Changes): Exercise Prescription Changes - 05/17/17 0950      Response to Exercise   Blood Pressure (Admit)  114/74    Blood Pressure (Exercise)  132/64    Blood Pressure (Exit)  104/64    Heart Rate (Admit)  83 bpm    Heart Rate (Exercise)  136 bpm    Heart Rate (Exit)  83 bpm    Rating of Perceived Exertion (Exercise)  13    Symptoms  none    Duration  Progress to 45 minutes of aerobic exercise without signs/symptoms of physical distress    Intensity  THRR New 64-151      Progression   Progression  Continue to progress workloads to maintain intensity without signs/symptoms of physical distress.    Average METs  6.7      Resistance Training   Training Prescription  Yes    Weight  5lbs    Reps  10-15    Time  10 Minutes      Interval Training   Interval Training  No      Treadmill   MPH  3.4    Grade  5    Minutes  10    METs  5.95      Bike   Level  2    Minutes  10    METs  6.67      NuStep   Level  6    SPM  80    Minutes  10    METs  7.4      Home Exercise Plan   Plans to continue exercise at  Home (comment)    Frequency  Add 3 additional days to program exercise sessions.    Initial Home Exercises Provided  03/05/17       Nutrition:  Target Goals: Understanding of nutrition guidelines, daily intake of sodium 1500mg , cholesterol 200mg , calories 30% from fat and 7% or less from saturated fats, daily to have 5 or more servings of fruits and  vegetables.  Biometrics:    Nutrition Therapy Plan and Nutrition Goals:   Nutrition Discharge: Nutrition Scores:   Nutrition Goals Re-Evaluation:   Nutrition Goals Re-Evaluation:   Nutrition Goals Discharge (Final Nutrition Goals Re-Evaluation):   Psychosocial: Target Goals: Acknowledge presence or absence of significant depression and/or stress, maximize coping skills, provide positive support system. Participant is able to verbalize types and ability to use techniques and skills needed for reducing stress and depression.  Initial Review & Psychosocial Screening:   Quality of Life Scores:   PHQ-9: Recent Review Flowsheet Data    Depression screen [facility] 2/9 02/24/2017   Decreased Interest 0   Down, Depressed, Hopeless 1   PHQ - 2 Score 1     Interpretation of Total Score  Total Score Depression Severity:  1-4 = Minimal depression, 5-9 = Mild depression, 10-14 = Moderate depression, 15-19 = Moderately severe depression, 20-27 = Severe depression   Psychosocial Evaluation and Intervention:   Psychosocial Re-Evaluation: Psychosocial Re-Evaluation    Parker Name 03/31/17 1712 04/23/17 1725 05/21/17 1718         Psychosocial Re-Evaluation   Current issues with  Current Stress Concerns  Current Stress Concerns  Current Stress Concerns     Comments  Markella sees a therapist ona regular basis   Niamya sees a therapist ona regular basis   Neomi sees a therapist ona regular basis      Expected Outcomes  Will continue to offer emotional support and resoources as needed  Will continue to offer emotional support and resoources as needed  Will continue to offer emotional support and resoources as needed     Interventions  Encouraged to attend Cardiac Rehabilitation for the exercise  Encouraged to attend Cardiac Rehabilitation for the exercise  Encouraged to attend Cardiac Rehabilitation for the exercise     Continue Psychosocial Services   No Follow up required  No Follow up required  -        Initial Review   Source of Stress Concerns  Family  Family  Family        Psychosocial Discharge (Final Psychosocial Re-Evaluation): Psychosocial Re-Evaluation - 05/21/17 1718      Psychosocial Re-Evaluation   Current issues with  Current Stress Concerns    Comments  Kem sees a therapist ona regular basis     Expected Outcomes  Will continue to offer emotional support and resoources as needed    Interventions  Encouraged to attend Cardiac Rehabilitation for the exercise      Initial Review   Source of Stress Concerns  Family       Vocational Rehabilitation: Provide vocational rehab assistance to qualifying candidates.   Vocational Rehab Evaluation & Intervention:   Education: Education Goals: Education classes will be provided on a weekly basis, covering required topics. Participant will state understanding/return demonstration of topics presented.  Learning Barriers/Preferences:   Education Topics: Count Your Pulse:  -Group instruction provided by verbal instruction, demonstration, patient participation and written materials to support subject.  Instructors address importance of being able to find your pulse and how to count your pulse when at home without a heart monitor.  Patients get hands on experience counting their pulse with staff help and individually.   Heart Attack, Angina, and Risk Factor Modification:  -Group instruction provided by verbal instruction, video, and written materials to support subject.  Instructors address signs and symptoms of angina and heart attacks.    Also discuss risk factors for heart disease and how to make changes to improve heart health risk factors.   Functional Fitness:  -Group instruction provided by verbal instruction, demonstration, patient participation, and written materials to support subject.  Instructors address safety measures for doing things around the house.  Discuss how to get up and down off the floor, how to pick  things up properly, how to safely get out of a chair without assistance, and balance training.   Meditation and Mindfulness:  -Group instruction provided by verbal instruction, patient participation, and written materials to support subject.  Instructor addresses importance of mindfulness and meditation practice to help reduce stress and improve awareness.  Instructor also leads participants through a meditation exercise.    Stretching for Flexibility and Mobility:  -Group instruction provided by verbal instruction, patient participation, and written materials to support subject.  Instructors lead participants through series of stretches that are designed to increase flexibility thus improving mobility.  These stretches are additional exercise for major muscle groups that are typically performed during regular warm up and cool down.   Hands Only CPR:  -Group verbal, video, and participation provides a basic overview of AHA guidelines for community CPR. Role-play of emergencies allow participants the opportunity to practice calling for help and chest compression technique with discussion of AED use.   Hypertension: -Group verbal and written instruction that provides a basic overview of hypertension including the most recent diagnostic guidelines, risk factor reduction with self-care instructions and medication management.    Nutrition I class: Heart Healthy Eating:  -Group instruction provided by PowerPoint slides, verbal discussion, and written materials to support subject matter. The instructor gives an explanation and review of the Therapeutic Lifestyle Changes diet recommendations, which includes a discussion on lipid goals, dietary fat, sodium, fiber, plant stanol/sterol esters, sugar, and the components of a well-balanced, healthy diet.   CARDIAC REHAB PHASE II ORIENTATION from 02/18/2017 in [place]  Date  02/18/17  Educator  RD  Instruction Review Code   Not applicable      Nutrition II class: Lifestyle Skills:  -Group instruction provided by PowerPoint slides, verbal discussion, and written materials to support subject matter. The instructor gives an explanation and review of label reading, grocery shopping for heart health, heart healthy recipe modifications, and ways to make healthier choices when eating out.   CARDIAC REHAB PHASE II ORIENTATION from  02/18/2017 in [place]  Date  02/18/17  Educator  RD  Instruction Review Code  Not applicable      Diabetes Question & Answer:  -Group instruction provided by PowerPoint slides, verbal discussion, and written materials to support subject matter. The instructor gives an explanation and review of diabetes co-morbidities, pre- and post-prandial blood glucose goals, pre-exercise blood glucose goals, signs, symptoms, and treatment of hypoglycemia and hyperglycemia, and foot care basics.   Diabetes Blitz:  -Group instruction provided by PowerPoint slides, verbal discussion, and written materials to support subject matter. The instructor gives an explanation and review of the physiology behind type 1 and type 2 diabetes, diabetes medications and rational behind using different medications, pre- and post-prandial blood glucose recommendations and Hemoglobin A1c goals, diabetes diet, and exercise including blood glucose guidelines for exercising safely.    Portion Distortion:  -Group instruction provided by PowerPoint slides, verbal discussion, written materials, and food models to support subject matter. The instructor gives an explanation of serving size versus portion size, changes in portions sizes over the last 20 years, and what consists of a serving from each food group.   Stress Management:  -Group instruction provided by verbal instruction, video, and written materials to support subject matter.  Instructors review role of stress in heart disease and how to  cope with stress positively.     Exercising on Your Own:  -Group instruction provided by verbal instruction, power point, and written materials to support subject.  Instructors discuss benefits of exercise, components of exercise, frequency and intensity of exercise, and end points for exercise.  Also discuss use of nitroglycerin and activating EMS.  Review options of places to exercise outside of rehab.  Review guidelines for sex with heart disease.   Cardiac Drugs I:  -Group instruction provided by verbal instruction and written materials to support subject.  Instructor reviews cardiac drug classes: antiplatelets, anticoagulants, beta blockers, and statins.  Instructor discusses reasons, side effects, and lifestyle considerations for each drug class.   Cardiac Drugs II:  -Group instruction provided by verbal instruction and written materials to support subject.  Instructor reviews cardiac drug classes: angiotensin converting enzyme inhibitors (ACE-I), angiotensin II receptor blockers (ARBs), nitrates, and calcium channel blockers.  Instructor discusses reasons, side effects, and lifestyle considerations for each drug class.   Anatomy and Physiology of the Circulatory System:  Group verbal and written instruction and models provide basic cardiac anatomy and physiology, with the coronary electrical and arterial systems. Review of: AMI, Angina, Valve disease, Heart Failure, Peripheral Artery Disease, Cardiac Arrhythmia, Pacemakers, and the ICD.   Other Education:  -Group or individual verbal, written, or video instructions that support the educational goals of the cardiac rehab program.   Knowledge Questionnaire Score:   Core Components/Risk Factors/Patient Goals at Admission:   Core Components/Risk Factors/Patient Goals Review:  Goals and Risk Factor Review    Row Name 03/31/17 1710 04/23/17 1725 05/21/17 1718         Core Components/Risk Factors/Patient Goals Review   Personal  Goals Review  Hypertension;Lipids;Stress  Hypertension;Lipids;Stress  Hypertension;Lipids;Stress     Review  Avaiah's vital signs have been stable at cardiac rehab. Milderd is doing well with exercise  Marca's vital signs have been stable at cardiac rehab. Evalin is doing well with exercise  Dody's vital signs have been stable at cardiac rehab. Ikeya is doing well with exercise     Expected Outcomes  Allahna will continue to take her medicaitons as presribed  and continue to exercise at cardiac rehab  Argie will continue to take her medicaitons as presribed and continue to exercise at cardiac rehab  Shaunika will continue to take her medicaitons as presribed and continue to exercise at cardiac rehab        Core Components/Risk Factors/Patient Goals at Discharge (Final Review):  Goals and Risk Factor Review - 05/21/17 1718      Core Components/Risk Factors/Patient Goals Review   Personal Goals Review  Hypertension;Lipids;Stress    Review  Marchele's vital signs have been stable at cardiac rehab. Joeli is doing well with exercise    Expected Outcomes  Kevyn will continue to take her medicaitons as presribed and continue to exercise at cardiac rehab       ITP Comments: ITP Comments    Row Name 04/23/17 1726 05/21/17 1718 05/21/17 1719       ITP Comments  Medical Director, Dr. Fransico Him  30 day ITP review. Patient with good participation and attendance at cardiac rehab.  30 day ITP review. Patient with fair participation and attendance at cardiac rehab.        Comments: See ITP comments, Neave's attendance has been fair.Barnet Pall, RN,BSN 05/21/2017 5:21 PM

## 2017-05-24 ENCOUNTER — Encounter (HOSPITAL_COMMUNITY): Payer: BLUE CROSS/BLUE SHIELD

## 2017-05-24 DIAGNOSIS — H04123 Dry eye syndrome of bilateral lacrimal glands: Secondary | ICD-10-CM | POA: Diagnosis not present

## 2017-05-25 DIAGNOSIS — M5431 Sciatica, right side: Secondary | ICD-10-CM | POA: Diagnosis not present

## 2017-05-25 DIAGNOSIS — M9901 Segmental and somatic dysfunction of cervical region: Secondary | ICD-10-CM | POA: Diagnosis not present

## 2017-05-25 DIAGNOSIS — M9903 Segmental and somatic dysfunction of lumbar region: Secondary | ICD-10-CM | POA: Diagnosis not present

## 2017-05-26 ENCOUNTER — Ambulatory Visit (HOSPITAL_COMMUNITY)
Admission: RE | Admit: 2017-05-26 | Discharge: 2017-05-26 | Disposition: A | Discharge status: Home / Self Care | Payer: BLUE CROSS/BLUE SHIELD | Source: Ambulatory Visit | Attending: Family Medicine | Admitting: Family Medicine

## 2017-05-26 ENCOUNTER — Encounter (HOSPITAL_COMMUNITY)
Admission: RE | Admit: 2017-05-26 | Discharge: 2017-05-26 | Disposition: A | Discharge status: Home / Self Care | Payer: BLUE CROSS/BLUE SHIELD | Source: Ambulatory Visit | Attending: Cardiology | Admitting: Cardiology

## 2017-05-26 DIAGNOSIS — Z7902 Long term (current) use of antithrombotics/antiplatelets: Secondary | ICD-10-CM | POA: Diagnosis not present

## 2017-05-26 DIAGNOSIS — Z955 Presence of coronary angioplasty implant and graft: Secondary | ICD-10-CM | POA: Diagnosis not present

## 2017-05-26 DIAGNOSIS — I10 Essential (primary) hypertension: Secondary | ICD-10-CM | POA: Insufficient documentation

## 2017-05-26 DIAGNOSIS — Z79899 Other long term (current) drug therapy: Secondary | ICD-10-CM | POA: Insufficient documentation

## 2017-05-26 DIAGNOSIS — E039 Hypothyroidism, unspecified: Secondary | ICD-10-CM | POA: Insufficient documentation

## 2017-05-26 DIAGNOSIS — M797 Fibromyalgia: Secondary | ICD-10-CM | POA: Insufficient documentation

## 2017-05-26 DIAGNOSIS — K219 Gastro-esophageal reflux disease without esophagitis: Secondary | ICD-10-CM | POA: Diagnosis not present

## 2017-05-26 DIAGNOSIS — E785 Hyperlipidemia, unspecified: Secondary | ICD-10-CM | POA: Insufficient documentation

## 2017-05-26 DIAGNOSIS — E559 Vitamin D deficiency, unspecified: Secondary | ICD-10-CM | POA: Insufficient documentation

## 2017-05-26 DIAGNOSIS — Z7982 Long term (current) use of aspirin: Secondary | ICD-10-CM | POA: Insufficient documentation

## 2017-05-26 DIAGNOSIS — G4700 Insomnia, unspecified: Secondary | ICD-10-CM | POA: Diagnosis not present

## 2017-05-26 DIAGNOSIS — F329 Major depressive disorder, single episode, unspecified: Secondary | ICD-10-CM | POA: Diagnosis not present

## 2017-05-26 DIAGNOSIS — I2510 Atherosclerotic heart disease of native coronary artery without angina pectoris: Secondary | ICD-10-CM | POA: Insufficient documentation

## 2017-05-26 DIAGNOSIS — C8190 Hodgkin lymphoma, unspecified, unspecified site: Secondary | ICD-10-CM | POA: Insufficient documentation

## 2017-05-26 NOTE — Progress Notes (Signed)
Cynthia Poole reports that she feels dizzy whenever she changes positions. Orthostatic blood pressures checked. Lying blood pressure 107/64 heart rate 83. When the patient changed to a sitting position, Cynthia Poole reported feeling dizzy. Sitting blood pressure 85/61. Patient was given a few sips of water.  Standing blood pressure 100/57. Telemetry rhythm.Sinus Rhythm with a bundle branch block. Cynthia Poole reports having chest discomfort earlier this week but does not have any chest discomfort currently. Dr Irven Shelling office called and notified. I spoke with Anderson Malta. Will obtain 12 lead ECG. Will fax exercise flow sheets to Dr. Irven Shelling office for review with today's ECG tracing.Barnet Pall, RN,BSN 05/26/2017 11:43 AM

## 2017-05-27 DIAGNOSIS — M5431 Sciatica, right side: Secondary | ICD-10-CM | POA: Diagnosis not present

## 2017-05-27 DIAGNOSIS — M9901 Segmental and somatic dysfunction of cervical region: Secondary | ICD-10-CM | POA: Diagnosis not present

## 2017-05-27 DIAGNOSIS — M9903 Segmental and somatic dysfunction of lumbar region: Secondary | ICD-10-CM | POA: Diagnosis not present

## 2017-05-28 ENCOUNTER — Encounter (HOSPITAL_COMMUNITY)
Admission: RE | Admit: 2017-05-28 | Discharge: 2017-05-28 | Disposition: A | Discharge status: Home / Self Care | Payer: BLUE CROSS/BLUE SHIELD | Source: Ambulatory Visit | Attending: Cardiology | Admitting: Cardiology

## 2017-05-28 ENCOUNTER — Encounter: Payer: Self-pay | Admitting: Family Medicine

## 2017-05-28 ENCOUNTER — Ambulatory Visit (INDEPENDENT_AMBULATORY_CARE_PROVIDER_SITE_OTHER): Payer: BLUE CROSS/BLUE SHIELD | Admitting: Family Medicine

## 2017-05-28 VITALS — BP 121/68 | Ht 66.0 in | Wt 145.0 lb

## 2017-05-28 DIAGNOSIS — M79672 Pain in left foot: Secondary | ICD-10-CM | POA: Diagnosis not present

## 2017-05-28 DIAGNOSIS — G609 Hereditary and idiopathic neuropathy, unspecified: Secondary | ICD-10-CM

## 2017-05-28 DIAGNOSIS — M79671 Pain in right foot: Secondary | ICD-10-CM | POA: Diagnosis not present

## 2017-05-28 NOTE — Patient Instructions (Signed)
We will call you if there is something unexpected seen on your X rays. If they are normal, I will send you a copy of the report. I think the pain in your feet is from your peripheral neuropathy. I could recommend a neurologist such as Dr. Jaynee Eagles for that. Your PCP would likely have to do the referral  Have a Happy Holiday!

## 2017-05-28 NOTE — Progress Notes (Signed)
  Cynthia Poole - 61 y.o. female MRN [id number]  Date of birth: 12-04-1955    SUBJECTIVE:      Chief Complaint:/ HPI:   B foot pain.  Has had long-standing problems with burning and paresthesias in her feet.  Over the last 2 months they have gotten significantly worse in the most pain is in the heel area.  This is keeping her from doing her daily walking.  Says the neuropathy is not from diabetes.  Not sure where it came from.  Pain is 8-9 out of 10, pins and needles, burning sensation worse on the plantar surface of the foot particularly in the heel.  It gets worse with activity.  Nothing seems to make it better   ROS:     No unusual weight change.  No fever.  No other unusual areas of paresthesias.  PERTINENT  PMH / PSH FH / / SH:  Past Medical, Surgical, Social, and Family History Reviewed & Updated in the EMR.  Pertinent findings include:  Treatment for Hodgkin's lymphoma in her 15s with radiation.  She did not have chemotherapy. History of sciatic pain on the right side  History of coronary stent Status post splenectomy  OBJECTIVE: BP 121/68   Ht 5\' 6"  (1.676 m)   Wt 145 lb (65.8 kg)   BMI 23.40 kg/m   Physical Exam:  Vital signs are reviewed. GENERAL: Well-developed female no acute distress FEET: Retained longitudinal arch and with collapsed transverse arch bilaterally.  Feet are diffusely tender on the plantar surface.  There is no point tenderness over the origin of the plantar fascia and no specific tenderness along the tract of the plantar fascia.  The lateral and medial malleoli are nontender.  The squeeze test for the calcaneus is negative bilaterally. SKIN: No unusual erythema, no rash, normal tone and color. VASCULAR: Dorsalis pedis and posterior tibialis pulses are 2+ bilateral symmetrical. ANKLE: Full range of motion with solid endpoint that is symmetrical bilaterally. NEURO: DTR ankle 1-2+ bilaterally symmetrical.  No clonus.  Diffuse hyperesthesia bilateral feet  particularly on the plantar surface. PSYCHIATRIC: Alert and oriented x4.  Affect is a little bit constricted.  Mildly anxious.  Speech slightly pressured.  Asks and answers questions appropriately.  ASSESSMENT & PLAN:  #1.  Foot pain specifically worse over the heels.  This seems to be related to more diffuse peripheral neuropathy.  She does have history of Hodgkin's lymphoma but never received chemotherapy.  Sounds like neuropathic symptoms have been going on for greater than 1 year.  She has no personal history of diabetes mellitus.  She has some over-the-counter orthotics that seem adequate.  I told her there is nothing different that I can do for her from sports medicine standpoint to improve her foot pain.  I would recommend she return to her PCP and get referral to a neurologist.  She has previously taken Lyrica and it gave her a stomachache so we will not try her on any type of medications and instead let neurology evaluate her further

## 2017-05-28 NOTE — Progress Notes (Signed)
Pt returned to exercise today.  Pt stated that she had not heard from Dr. Einar Gip office.  Called and spoke to [employer] [facility].  Anderson Malta stated that the pt bp were reviewed and no further intervention was warranted.  Anderson Malta stated ok for pt to return to exercise, written documentation to be provided when Dr. Einar Gip is able to sign the clearance form.  Advised pt of the conversation, Pt verbalized understanding. Cherre Huger, BSN Cardiac and Training and development officer

## 2017-06-03 DIAGNOSIS — F331 Major depressive disorder, recurrent, moderate: Secondary | ICD-10-CM | POA: Diagnosis not present

## 2017-06-04 ENCOUNTER — Encounter (HOSPITAL_COMMUNITY)
Admission: RE | Admit: 2017-06-04 | Discharge: 2017-06-04 | Disposition: A | Discharge status: Home / Self Care | Payer: BLUE CROSS/BLUE SHIELD | Source: Ambulatory Visit | Attending: Cardiology | Admitting: Cardiology

## 2017-06-04 DIAGNOSIS — Z955 Presence of coronary angioplasty implant and graft: Secondary | ICD-10-CM

## 2017-06-04 DIAGNOSIS — I10 Essential (primary) hypertension: Secondary | ICD-10-CM | POA: Diagnosis not present

## 2017-06-04 DIAGNOSIS — F329 Major depressive disorder, single episode, unspecified: Secondary | ICD-10-CM | POA: Diagnosis not present

## 2017-06-04 DIAGNOSIS — K219 Gastro-esophageal reflux disease without esophagitis: Secondary | ICD-10-CM | POA: Diagnosis not present

## 2017-06-04 DIAGNOSIS — I2510 Atherosclerotic heart disease of native coronary artery without angina pectoris: Secondary | ICD-10-CM | POA: Diagnosis not present

## 2017-06-04 DIAGNOSIS — C8190 Hodgkin lymphoma, unspecified, unspecified site: Secondary | ICD-10-CM | POA: Diagnosis not present

## 2017-06-04 DIAGNOSIS — Z7902 Long term (current) use of antithrombotics/antiplatelets: Secondary | ICD-10-CM | POA: Diagnosis not present

## 2017-06-04 DIAGNOSIS — E559 Vitamin D deficiency, unspecified: Secondary | ICD-10-CM | POA: Diagnosis not present

## 2017-06-04 DIAGNOSIS — G4700 Insomnia, unspecified: Secondary | ICD-10-CM | POA: Diagnosis not present

## 2017-06-04 DIAGNOSIS — M797 Fibromyalgia: Secondary | ICD-10-CM | POA: Diagnosis not present

## 2017-06-04 DIAGNOSIS — Z79899 Other long term (current) drug therapy: Secondary | ICD-10-CM | POA: Diagnosis not present

## 2017-06-04 DIAGNOSIS — Z7982 Long term (current) use of aspirin: Secondary | ICD-10-CM | POA: Diagnosis not present

## 2017-06-04 DIAGNOSIS — E039 Hypothyroidism, unspecified: Secondary | ICD-10-CM | POA: Diagnosis not present

## 2017-06-04 DIAGNOSIS — E785 Hyperlipidemia, unspecified: Secondary | ICD-10-CM | POA: Diagnosis not present

## 2017-06-07 ENCOUNTER — Encounter (HOSPITAL_COMMUNITY)
Admission: RE | Admit: 2017-06-07 | Discharge: 2017-06-07 | Disposition: A | Discharge status: Home / Self Care | Payer: BLUE CROSS/BLUE SHIELD | Source: Ambulatory Visit | Attending: Cardiology | Admitting: Cardiology

## 2017-06-07 DIAGNOSIS — Z955 Presence of coronary angioplasty implant and graft: Secondary | ICD-10-CM

## 2017-06-07 DIAGNOSIS — F331 Major depressive disorder, recurrent, moderate: Secondary | ICD-10-CM | POA: Diagnosis not present

## 2017-06-07 NOTE — Progress Notes (Signed)
Incomplete Session Note  Patient Details  Name: Cynthia Poole MRN: [id number] Date of Birth: December 26, 1955 Referring Provider:     CARDIAC REHAB PHASE II ORIENTATION from 02/18/2017 in [place]  Referring Provider  Kela Millin MD      [name] Thomsson did not complete her rehab session.  Wanita reports that she has been experiencing hot flashes and does not feel fell today. Rianne says that her hormonal medication doses have been decreased over the past 6 month. Blood pressure 108/70. Heart rate 78. I advised Glady not to exercise today if she does not feel well. The patient plans to return to exercise on Wedneday.Barnet Pall, RN,BSN 06/07/2017 10:10 AM

## 2017-06-08 DIAGNOSIS — M9903 Segmental and somatic dysfunction of lumbar region: Secondary | ICD-10-CM | POA: Diagnosis not present

## 2017-06-08 DIAGNOSIS — M5431 Sciatica, right side: Secondary | ICD-10-CM | POA: Diagnosis not present

## 2017-06-08 DIAGNOSIS — M9901 Segmental and somatic dysfunction of cervical region: Secondary | ICD-10-CM | POA: Diagnosis not present

## 2017-06-09 ENCOUNTER — Encounter (HOSPITAL_COMMUNITY)
Admission: RE | Admit: 2017-06-09 | Discharge: 2017-06-09 | Disposition: A | Discharge status: Home / Self Care | Payer: BLUE CROSS/BLUE SHIELD | Source: Ambulatory Visit | Attending: Cardiology | Admitting: Cardiology

## 2017-06-09 DIAGNOSIS — C8190 Hodgkin lymphoma, unspecified, unspecified site: Secondary | ICD-10-CM | POA: Diagnosis not present

## 2017-06-09 DIAGNOSIS — K219 Gastro-esophageal reflux disease without esophagitis: Secondary | ICD-10-CM | POA: Diagnosis not present

## 2017-06-09 DIAGNOSIS — Z955 Presence of coronary angioplasty implant and graft: Secondary | ICD-10-CM | POA: Diagnosis not present

## 2017-06-09 DIAGNOSIS — E7800 Pure hypercholesterolemia, unspecified: Secondary | ICD-10-CM | POA: Diagnosis not present

## 2017-06-09 DIAGNOSIS — E039 Hypothyroidism, unspecified: Secondary | ICD-10-CM | POA: Diagnosis not present

## 2017-06-09 DIAGNOSIS — I2510 Atherosclerotic heart disease of native coronary artery without angina pectoris: Secondary | ICD-10-CM | POA: Diagnosis not present

## 2017-06-09 DIAGNOSIS — G4700 Insomnia, unspecified: Secondary | ICD-10-CM | POA: Diagnosis not present

## 2017-06-09 DIAGNOSIS — F329 Major depressive disorder, single episode, unspecified: Secondary | ICD-10-CM | POA: Diagnosis not present

## 2017-06-09 DIAGNOSIS — Z79899 Other long term (current) drug therapy: Secondary | ICD-10-CM | POA: Diagnosis not present

## 2017-06-09 DIAGNOSIS — E785 Hyperlipidemia, unspecified: Secondary | ICD-10-CM | POA: Diagnosis not present

## 2017-06-09 DIAGNOSIS — I25119 Atherosclerotic heart disease of native coronary artery with unspecified angina pectoris: Secondary | ICD-10-CM | POA: Diagnosis not present

## 2017-06-09 DIAGNOSIS — M797 Fibromyalgia: Secondary | ICD-10-CM | POA: Diagnosis not present

## 2017-06-09 DIAGNOSIS — E559 Vitamin D deficiency, unspecified: Secondary | ICD-10-CM | POA: Diagnosis not present

## 2017-06-09 DIAGNOSIS — I10 Essential (primary) hypertension: Secondary | ICD-10-CM | POA: Diagnosis not present

## 2017-06-09 DIAGNOSIS — Z7902 Long term (current) use of antithrombotics/antiplatelets: Secondary | ICD-10-CM | POA: Diagnosis not present

## 2017-06-09 DIAGNOSIS — Z7982 Long term (current) use of aspirin: Secondary | ICD-10-CM | POA: Diagnosis not present

## 2017-06-09 NOTE — Progress Notes (Signed)
Discharge Progress Report  Patient Details  Name: Cynthia Poole MRN: [id number] Date of Birth: Oct 21, 1955 Referring Provider:     [facility] from 02/18/2017 in [place]  Referring Provider  Kela Millin MD       Number of Visits: 28  Reason for Discharge:  Patient independent in their exercise.  Smoking History:  Social History   Tobacco Use  Smoking Status Never Smoker  Smokeless Tobacco Never Used    Diagnosis:  09/29/16 Stented coronary artery  ADL UCSD:   Initial Exercise Prescription: Initial Exercise Prescription - 02/18/17 1300      Date of Initial Exercise RX and Referring Provider   Date  02/18/17    Referring Provider  Kela Millin MD      Treadmill   MPH  3    Grade  1    Minutes  10    METs  3.71      Bike   Level  0.8    Minutes  10    METs  3.32      NuStep   Level  3    SPM  80    Minutes  10    METs  2.5      Arm Ergometer   Level  2    Watts  20    Minutes  10    METs  2.61      Prescription Details   Frequency (times per week)  3    Duration  Progress to 30 minutes of continuous aerobic without signs/symptoms of physical distress      Intensity   THRR 40-80% of Max Heartrate  64-127    Ratings of Perceived Exertion  11-13    Perceived Dyspnea  0-4      Progression   Progression  Continue to progress workloads to maintain intensity without signs/symptoms of physical distress.      Resistance Training   Training Prescription  Yes    Weight  3lbs    Reps  10-15       Discharge Exercise Prescription (Final Exercise Prescription Changes): Exercise Prescription Changes - 06/18/17 1004      Response to Exercise   Blood Pressure (Admit)  104/70    Blood Pressure (Exercise)  126/76    Blood Pressure (Exit)  100/62    Heart Rate (Admit)  94 bpm    Heart Rate (Exercise)  139 bpm    Heart Rate (Exit)  93 bpm    Rating of Perceived Exertion (Exercise)   14    Symptoms  none    Duration  Continue with 45 min of aerobic exercise without signs/symptoms of physical distress.    Intensity  THRR unchanged      Progression   Progression  Continue to progress workloads to maintain intensity without signs/symptoms of physical distress.    Average METs  6.7      Resistance Training   Training Prescription  Yes    Weight  6lbs    Reps  10-15    Time  10 Minutes      Interval Training   Interval Training  No      Treadmill   MPH  3.4    Grade  5    Minutes  10    METs  5.95      Bike   Level  2    Minutes  10    METs  6.76  NuStep   Level  6    SPM  85    Minutes  10    METs  7.3      Home Exercise Plan   Plans to continue exercise at  Home (comment)    Frequency  Add 3 additional days to program exercise sessions.    Initial Home Exercises Provided  03/05/17       Functional Capacity: 6 Minute Walk    Row Name 02/18/17 1308 06/11/17 1010       6 Minute Walk   Phase  Initial  Discharge    Distance  1882 feet  2042 feet    Distance % Change  -  8.5 %    Walk Time  6 minutes  6 minutes    # of Rest Breaks  0  0    MPH  3.6  3.87    METS  5.1  5.29    RPE  12  11    VO2 Peak  17.72  18.51    Symptoms  Yes (comment)  No    Comments  balance concerns  -    Resting HR  78 bpm  92 bpm    Resting BP  148/80  -    Resting Oxygen Saturation   98 %  -    Exercise Oxygen Saturation  during 6 min walk  100 %  -    Max Ex. HR  115 bpm  129 bpm    Max Ex. BP  168/70  142/78    2 Minute Post BP  134/70  104/68       Psychological, QOL, Others - Outcomes: PHQ 2/9: Depression screen [facility] 2/9 06/09/2017 02/24/2017  Decreased Interest 0 0  Down, Depressed, Hopeless 0 1  PHQ - 2 Score 0 1    Quality of Life: Quality of Life - 06/18/17 1100      Quality of Life Scores   Health/Function Pre  13.03 %    Health/Function Post  20 %    Health/Function % Change  53.49 %    Socioeconomic Pre  18.86 %    Socioeconomic  Post  20.14 %    Socioeconomic % Change   6.79 %    Psych/Spiritual Pre  15.43 %    Psych/Spiritual Post  20.57 %    Psych/Spiritual % Change  33.31 %    Family Pre  19.3 %    Family Post  15.6 %    Family % Change  -19.17 %    GLOBAL Pre  15.65 %    GLOBAL Post  19.5 %    GLOBAL % Change  24.6 %       Personal Goals: Goals established at orientation with interventions provided to work toward goal. Personal Goals and Risk Factors at Admission - 02/18/17 1237      Core Components/Risk Factors/Patient Goals on Admission   Hypertension  Yes    Intervention  Provide education on lifestyle modifcations including regular physical activity/exercise, weight management, moderate sodium restriction and increased consumption of fresh fruit, vegetables, and low fat dairy, alcohol moderation, and smoking cessation.;Monitor prescription use compliance.    Expected Outcomes  Short Term: Continued assessment and intervention until BP is < 140/110m HG in hypertensive participants. < 130/826mHG in hypertensive participants with diabetes, heart failure or chronic kidney disease.;Long Term: Maintenance of blood pressure at goal levels.    Lipids  Yes    Intervention  Provide education and support  for participant on nutrition & aerobic/resistive exercise along with prescribed medications to achieve LDL <31m, HDL >466m    Expected Outcomes  Short Term: Participant states understanding of desired cholesterol values and is compliant with medications prescribed. Participant is following exercise prescription and nutrition guidelines.;Long Term: Cholesterol controlled with medications as prescribed, with individualized exercise RX and with personalized nutrition plan. Value goals: LDL < 7063mHDL > 40 mg.    Stress  Yes    Intervention  Offer individual and/or small group education and counseling on adjustment to heart disease, stress management and health-related lifestyle change. Teach and support self-help  strategies.;Refer participants experiencing significant psychosocial distress to appropriate mental health specialists for further evaluation and treatment. When possible, include family members and significant others in education/counseling sessions.    Expected Outcomes  Short Term: Participant demonstrates changes in health-related behavior, relaxation and other stress management skills, ability to obtain effective social support, and compliance with psychotropic medications if prescribed.;Long Term: Emotional wellbeing is indicated by absence of clinically significant psychosocial distress or social isolation.        Personal Goals Discharge: Goals and Risk Factor Review    Row Name 04/23/17 1725 05/21/17 1718 06/09/17 1113         Core Components/Risk Factors/Patient Goals Review   Personal Goals Review  Hypertension;Lipids;Stress  Hypertension;Lipids;Stress  Hypertension;Lipids;Stress     Review  Sury's vital signs have been stable at cardiac rehab. AnnLilya doing well with exercise  Markesha's vital signs have been stable at cardiac rehab. AnnToba doing well with exercise  Gionni's vital signs have been stable at cardiac rehab. AnnRickia doing well with exercise     Expected Outcomes  AnnTuckerll continue to take her medicaitons as presribed and continue to exercise at cardiac rehab  AnnChanningll continue to take her medicaitons as presribed and continue to exercise at cardiac rehab  AnnJenettall continue to take her medicaitons as presribed and continue to exercise at the club and walking and yoga twice a week        Exercise Goals and Review: Exercise Goals    Row Name 02/18/17 1319             Exercise Goals   Increase Physical Activity  Yes       Intervention  Provide advice, education, support and counseling about physical activity/exercise needs.;Develop an individualized exercise prescription for aerobic and resistive training based on initial evaluation findings, risk stratification, comorbidities  and participant's personal goals.       Expected Outcomes  Achievement of increased cardiorespiratory fitness and enhanced flexibility, muscular endurance and strength shown through measurements of functional capacity and personal statement of participant.       Increase Strength and Stamina  Yes       Intervention  Provide advice, education, support and counseling about physical activity/exercise needs.;Develop an individualized exercise prescription for aerobic and resistive training based on initial evaluation findings, risk stratification, comorbidities and participant's personal goals.       Expected Outcomes  Achievement of increased cardiorespiratory fitness and enhanced flexibility, muscular endurance and strength shown through measurements of functional capacity and personal statement of participant.       Able to understand and use rate of perceived exertion (RPE) scale  Yes       Intervention  Provide education and explanation on how to use RPE scale       Expected Outcomes  Short Term: Able to use RPE daily in rehab to  express subjective intensity level;Long Term:  Able to use RPE to guide intensity level when exercising independently          Nutrition & Weight - Outcomes: Pre Biometrics - 02/18/17 1408      Pre Biometrics   Height  '5\' 6"'  (1.676 m)    Weight  146 lb 9.7 oz (66.5 kg)    Waist Circumference  30 inches    Hip Circumference  40 inches    Waist to Hip Ratio  0.75 %    BMI (Calculated)  23.67    Triceps Skinfold  35 mm    % Body Fat  35.6 %    Grip Strength  32 kg    Flexibility  13.5 in    Single Leg Stand  16.09 seconds      Post Biometrics - 06/18/17 1100       Post  Biometrics   Height  '5\' 6"'  (1.676 m)    Weight  143 lb 4.8 oz (65 kg)    Waist Circumference  30.25 inches    Hip Circumference  42 inches    Waist to Hip Ratio  0.72 %    BMI (Calculated)  23.14    Triceps Skinfold  37 mm    % Body Fat  35.7 %    Grip Strength  29.5 kg    Flexibility   15 in    Single Leg Stand  30 seconds       Nutrition: Nutrition Therapy & Goals - 02/18/17 1119      Nutrition Therapy   Diet  Therapeutic Lifestyle Changes      Personal Nutrition Goals   Nutrition Goal  Wt loss of 1-2 lb/week to a wt loss goal of 6-10 lb at graduation from [facility], educate and counsel regarding individualized specific dietary modifications aiming towards targeted core components such as weight, hypertension, lipid management, diabetes, heart failure and other comorbidities.    Expected Outcomes  Short Term Goal: Understand basic principles of dietary content, such as calories, fat, sodium, cholesterol and nutrients.;Long Term Goal: Adherence to prescribed nutrition plan.       Nutrition Discharge: Nutrition Assessments - 02/18/17 1119      MEDFICTS Scores   Pre Score  24       Education Questionnaire Score: Knowledge Questionnaire Score - 06/18/17 1100      Knowledge Questionnaire Score   Pre Score  21/24    Post Score  22/24       Goals reviewed with patient; copy given to patient.Roux graduated from cardiac rehab program today with completion of 28 exercise sessions in Phase II. Pt maintained good attendance and progressed nicely during his participation in rehab as evidenced by increased MET level.   Medication list reconciled. Repeat  PHQ score- 0 .  Pt has made significant lifestyle changes and should be commended for her success. Pt feels she has achieved his goals during cardiac rehab.   Pt plans to continue exercise at the club, walking and doing yoga twice a week. We are proud of Sinead's progress! Chanah increased her distance and lost 3 pounds during her participation in the program. Barnet Pall, RN,BSN 06/29/2017 4:47 PM

## 2017-06-10 DIAGNOSIS — F331 Major depressive disorder, recurrent, moderate: Secondary | ICD-10-CM | POA: Diagnosis not present

## 2017-06-11 ENCOUNTER — Encounter (HOSPITAL_COMMUNITY)
Admission: RE | Admit: 2017-06-11 | Discharge: 2017-06-11 | Disposition: A | Discharge status: Home / Self Care | Payer: BLUE CROSS/BLUE SHIELD | Source: Ambulatory Visit | Attending: Cardiology | Admitting: Cardiology

## 2017-06-11 DIAGNOSIS — C8190 Hodgkin lymphoma, unspecified, unspecified site: Secondary | ICD-10-CM | POA: Diagnosis not present

## 2017-06-11 DIAGNOSIS — M797 Fibromyalgia: Secondary | ICD-10-CM | POA: Diagnosis not present

## 2017-06-11 DIAGNOSIS — I10 Essential (primary) hypertension: Secondary | ICD-10-CM | POA: Diagnosis not present

## 2017-06-11 DIAGNOSIS — Z7902 Long term (current) use of antithrombotics/antiplatelets: Secondary | ICD-10-CM | POA: Diagnosis not present

## 2017-06-11 DIAGNOSIS — I2510 Atherosclerotic heart disease of native coronary artery without angina pectoris: Secondary | ICD-10-CM | POA: Diagnosis not present

## 2017-06-11 DIAGNOSIS — Z955 Presence of coronary angioplasty implant and graft: Secondary | ICD-10-CM | POA: Diagnosis not present

## 2017-06-11 DIAGNOSIS — Z7982 Long term (current) use of aspirin: Secondary | ICD-10-CM | POA: Diagnosis not present

## 2017-06-11 DIAGNOSIS — Z79899 Other long term (current) drug therapy: Secondary | ICD-10-CM | POA: Diagnosis not present

## 2017-06-11 DIAGNOSIS — F329 Major depressive disorder, single episode, unspecified: Secondary | ICD-10-CM | POA: Diagnosis not present

## 2017-06-11 DIAGNOSIS — G4700 Insomnia, unspecified: Secondary | ICD-10-CM | POA: Diagnosis not present

## 2017-06-11 DIAGNOSIS — K219 Gastro-esophageal reflux disease without esophagitis: Secondary | ICD-10-CM | POA: Diagnosis not present

## 2017-06-11 DIAGNOSIS — E039 Hypothyroidism, unspecified: Secondary | ICD-10-CM | POA: Diagnosis not present

## 2017-06-11 DIAGNOSIS — E785 Hyperlipidemia, unspecified: Secondary | ICD-10-CM | POA: Diagnosis not present

## 2017-06-11 DIAGNOSIS — E559 Vitamin D deficiency, unspecified: Secondary | ICD-10-CM | POA: Diagnosis not present

## 2017-06-17 DIAGNOSIS — M9901 Segmental and somatic dysfunction of cervical region: Secondary | ICD-10-CM | POA: Diagnosis not present

## 2017-06-17 DIAGNOSIS — M9903 Segmental and somatic dysfunction of lumbar region: Secondary | ICD-10-CM | POA: Diagnosis not present

## 2017-06-17 DIAGNOSIS — F331 Major depressive disorder, recurrent, moderate: Secondary | ICD-10-CM | POA: Diagnosis not present

## 2017-06-17 DIAGNOSIS — M5431 Sciatica, right side: Secondary | ICD-10-CM | POA: Diagnosis not present

## 2017-06-18 ENCOUNTER — Encounter (HOSPITAL_COMMUNITY)
Admission: RE | Admit: 2017-06-18 | Discharge: 2017-06-18 | Disposition: A | Discharge status: Home / Self Care | Payer: BLUE CROSS/BLUE SHIELD | Source: Ambulatory Visit | Attending: Cardiology | Admitting: Cardiology

## 2017-06-18 VITALS — BP 104/70 | HR 94 | Ht 66.0 in | Wt 143.3 lb

## 2017-06-18 DIAGNOSIS — I10 Essential (primary) hypertension: Secondary | ICD-10-CM | POA: Diagnosis not present

## 2017-06-18 DIAGNOSIS — Z7902 Long term (current) use of antithrombotics/antiplatelets: Secondary | ICD-10-CM | POA: Diagnosis not present

## 2017-06-18 DIAGNOSIS — M797 Fibromyalgia: Secondary | ICD-10-CM | POA: Diagnosis not present

## 2017-06-18 DIAGNOSIS — Z955 Presence of coronary angioplasty implant and graft: Secondary | ICD-10-CM

## 2017-06-18 DIAGNOSIS — I2510 Atherosclerotic heart disease of native coronary artery without angina pectoris: Secondary | ICD-10-CM | POA: Diagnosis not present

## 2017-06-18 DIAGNOSIS — Z79899 Other long term (current) drug therapy: Secondary | ICD-10-CM | POA: Diagnosis not present

## 2017-06-18 DIAGNOSIS — E039 Hypothyroidism, unspecified: Secondary | ICD-10-CM | POA: Diagnosis not present

## 2017-06-18 DIAGNOSIS — E559 Vitamin D deficiency, unspecified: Secondary | ICD-10-CM | POA: Diagnosis not present

## 2017-06-18 DIAGNOSIS — I700 Atherosclerosis of aorta: Secondary | ICD-10-CM | POA: Diagnosis not present

## 2017-06-18 DIAGNOSIS — C8190 Hodgkin lymphoma, unspecified, unspecified site: Secondary | ICD-10-CM | POA: Diagnosis not present

## 2017-06-18 DIAGNOSIS — E785 Hyperlipidemia, unspecified: Secondary | ICD-10-CM | POA: Diagnosis not present

## 2017-06-18 DIAGNOSIS — G4700 Insomnia, unspecified: Secondary | ICD-10-CM | POA: Diagnosis not present

## 2017-06-18 DIAGNOSIS — R002 Palpitations: Secondary | ICD-10-CM | POA: Diagnosis not present

## 2017-06-18 DIAGNOSIS — Z7982 Long term (current) use of aspirin: Secondary | ICD-10-CM | POA: Diagnosis not present

## 2017-06-18 DIAGNOSIS — K219 Gastro-esophageal reflux disease without esophagitis: Secondary | ICD-10-CM | POA: Diagnosis not present

## 2017-06-18 DIAGNOSIS — F329 Major depressive disorder, single episode, unspecified: Secondary | ICD-10-CM | POA: Diagnosis not present

## 2017-06-18 DIAGNOSIS — E7800 Pure hypercholesterolemia, unspecified: Secondary | ICD-10-CM | POA: Diagnosis not present

## 2017-06-24 DIAGNOSIS — F331 Major depressive disorder, recurrent, moderate: Secondary | ICD-10-CM | POA: Diagnosis not present

## 2017-06-24 DIAGNOSIS — M5431 Sciatica, right side: Secondary | ICD-10-CM | POA: Diagnosis not present

## 2017-06-24 DIAGNOSIS — M9903 Segmental and somatic dysfunction of lumbar region: Secondary | ICD-10-CM | POA: Diagnosis not present

## 2017-06-24 DIAGNOSIS — M9901 Segmental and somatic dysfunction of cervical region: Secondary | ICD-10-CM | POA: Diagnosis not present

## 2017-07-01 DIAGNOSIS — M9901 Segmental and somatic dysfunction of cervical region: Secondary | ICD-10-CM | POA: Diagnosis not present

## 2017-07-01 DIAGNOSIS — M5431 Sciatica, right side: Secondary | ICD-10-CM | POA: Diagnosis not present

## 2017-07-01 DIAGNOSIS — F331 Major depressive disorder, recurrent, moderate: Secondary | ICD-10-CM | POA: Diagnosis not present

## 2017-07-01 DIAGNOSIS — M9903 Segmental and somatic dysfunction of lumbar region: Secondary | ICD-10-CM | POA: Diagnosis not present

## 2017-07-07 DIAGNOSIS — M9901 Segmental and somatic dysfunction of cervical region: Secondary | ICD-10-CM | POA: Diagnosis not present

## 2017-07-07 DIAGNOSIS — M5431 Sciatica, right side: Secondary | ICD-10-CM | POA: Diagnosis not present

## 2017-07-07 DIAGNOSIS — M9903 Segmental and somatic dysfunction of lumbar region: Secondary | ICD-10-CM | POA: Diagnosis not present

## 2017-07-12 DIAGNOSIS — M9903 Segmental and somatic dysfunction of lumbar region: Secondary | ICD-10-CM | POA: Diagnosis not present

## 2017-07-12 DIAGNOSIS — M5431 Sciatica, right side: Secondary | ICD-10-CM | POA: Diagnosis not present

## 2017-07-12 DIAGNOSIS — M9901 Segmental and somatic dysfunction of cervical region: Secondary | ICD-10-CM | POA: Diagnosis not present

## 2017-07-13 DIAGNOSIS — F331 Major depressive disorder, recurrent, moderate: Secondary | ICD-10-CM | POA: Diagnosis not present

## 2017-07-17 DIAGNOSIS — R002 Palpitations: Secondary | ICD-10-CM | POA: Diagnosis not present

## 2017-07-22 DIAGNOSIS — M9903 Segmental and somatic dysfunction of lumbar region: Secondary | ICD-10-CM | POA: Diagnosis not present

## 2017-07-22 DIAGNOSIS — F331 Major depressive disorder, recurrent, moderate: Secondary | ICD-10-CM | POA: Diagnosis not present

## 2017-07-22 DIAGNOSIS — M5431 Sciatica, right side: Secondary | ICD-10-CM | POA: Diagnosis not present

## 2017-07-22 DIAGNOSIS — M9901 Segmental and somatic dysfunction of cervical region: Secondary | ICD-10-CM | POA: Diagnosis not present

## 2017-07-26 DIAGNOSIS — M9903 Segmental and somatic dysfunction of lumbar region: Secondary | ICD-10-CM | POA: Diagnosis not present

## 2017-07-26 DIAGNOSIS — M5431 Sciatica, right side: Secondary | ICD-10-CM | POA: Diagnosis not present

## 2017-07-26 DIAGNOSIS — M9901 Segmental and somatic dysfunction of cervical region: Secondary | ICD-10-CM | POA: Diagnosis not present

## 2017-08-02 DIAGNOSIS — M9903 Segmental and somatic dysfunction of lumbar region: Secondary | ICD-10-CM | POA: Diagnosis not present

## 2017-08-02 DIAGNOSIS — M5431 Sciatica, right side: Secondary | ICD-10-CM | POA: Diagnosis not present

## 2017-08-02 DIAGNOSIS — M9901 Segmental and somatic dysfunction of cervical region: Secondary | ICD-10-CM | POA: Diagnosis not present

## 2017-08-02 DIAGNOSIS — H16223 Keratoconjunctivitis sicca, not specified as Sjogren's, bilateral: Secondary | ICD-10-CM | POA: Diagnosis not present

## 2017-08-09 DIAGNOSIS — M9903 Segmental and somatic dysfunction of lumbar region: Secondary | ICD-10-CM | POA: Diagnosis not present

## 2017-08-09 DIAGNOSIS — M9901 Segmental and somatic dysfunction of cervical region: Secondary | ICD-10-CM | POA: Diagnosis not present

## 2017-08-09 DIAGNOSIS — M5431 Sciatica, right side: Secondary | ICD-10-CM | POA: Diagnosis not present

## 2017-08-12 DIAGNOSIS — F331 Major depressive disorder, recurrent, moderate: Secondary | ICD-10-CM | POA: Diagnosis not present

## 2017-08-24 DIAGNOSIS — L700 Acne vulgaris: Secondary | ICD-10-CM | POA: Diagnosis not present

## 2017-08-24 DIAGNOSIS — Z85828 Personal history of other malignant neoplasm of skin: Secondary | ICD-10-CM | POA: Diagnosis not present

## 2017-08-26 DIAGNOSIS — F331 Major depressive disorder, recurrent, moderate: Secondary | ICD-10-CM | POA: Diagnosis not present

## 2017-09-02 DIAGNOSIS — F331 Major depressive disorder, recurrent, moderate: Secondary | ICD-10-CM | POA: Diagnosis not present

## 2017-09-09 DIAGNOSIS — F331 Major depressive disorder, recurrent, moderate: Secondary | ICD-10-CM | POA: Diagnosis not present

## 2017-09-23 DIAGNOSIS — F331 Major depressive disorder, recurrent, moderate: Secondary | ICD-10-CM | POA: Diagnosis not present

## 2017-09-27 DIAGNOSIS — H16223 Keratoconjunctivitis sicca, not specified as Sjogren's, bilateral: Secondary | ICD-10-CM | POA: Diagnosis not present

## 2017-09-27 DIAGNOSIS — H01009 Unspecified blepharitis unspecified eye, unspecified eyelid: Secondary | ICD-10-CM | POA: Diagnosis not present

## 2017-09-30 DIAGNOSIS — F331 Major depressive disorder, recurrent, moderate: Secondary | ICD-10-CM | POA: Diagnosis not present

## 2017-10-13 DIAGNOSIS — Z6823 Body mass index (BMI) 23.0-23.9, adult: Secondary | ICD-10-CM | POA: Diagnosis not present

## 2017-10-13 DIAGNOSIS — M79674 Pain in right toe(s): Secondary | ICD-10-CM | POA: Diagnosis not present

## 2017-10-14 DIAGNOSIS — F331 Major depressive disorder, recurrent, moderate: Secondary | ICD-10-CM | POA: Diagnosis not present

## 2017-10-21 ENCOUNTER — Encounter: Payer: Self-pay | Admitting: Sports Medicine

## 2017-10-21 ENCOUNTER — Ambulatory Visit (INDEPENDENT_AMBULATORY_CARE_PROVIDER_SITE_OTHER): Payer: BLUE CROSS/BLUE SHIELD | Admitting: Sports Medicine

## 2017-10-21 VITALS — BP 126/70 | Ht 66.0 in | Wt 145.0 lb

## 2017-10-21 DIAGNOSIS — M79676 Pain in unspecified toe(s): Secondary | ICD-10-CM | POA: Diagnosis not present

## 2017-10-21 DIAGNOSIS — M79672 Pain in left foot: Secondary | ICD-10-CM | POA: Diagnosis not present

## 2017-10-21 DIAGNOSIS — M79671 Pain in right foot: Secondary | ICD-10-CM | POA: Diagnosis not present

## 2017-10-21 DIAGNOSIS — M79674 Pain in right toe(s): Secondary | ICD-10-CM | POA: Diagnosis not present

## 2017-10-22 ENCOUNTER — Encounter: Payer: Self-pay | Admitting: Sports Medicine

## 2017-10-22 NOTE — Progress Notes (Signed)
   Subjective:    Patient ID: Cynthia Poole, female    DOB: [birth year], 62 y.o.   MRN: [id number]  HPI chief complaint: Right second toe pain  62 year old female came in today initially to discuss custom orthotics. She has seen Dr. Nori Riis in the past. She describes chronic bilateral heel pain likely secondary to plantar fasciitis. She does have a pair of custom orthotics which are old but appear to be quite supportive. Her main complaint today is right second toe pain. She began to experience in acute onset of pain and swelling while walking on Easter. She was seen by her primary care doctor. X-rays of her right foot were ordered. They are not available for my immediate review but the report shows nothing acute. She localizes her pain and swelling to the distal second metatarsal and MTP joint. She feels swelling on both the top and bottom of the foot. She denies prior occurrences.  Past medical history reviewed Medications reviewed Allergies reviewed   Review of Systems    as above Objective:   Physical Exam  Well-developed, well-nourished. No acute distress. Awake alert and oriented 3. Vital signs are reviewed  Examination of the right foot shows swelling concentrated at the MTP joint with swelling extending into the distal second metatarsal and proximal phalanx. She is tender to palpation along the distal second metatarsal. No erythema. She is tender to palpation at the calcaneal origin of the plantar fascia. Neurovascularly intact distally. Walking with a limp.  MSK ultrasound of the right foot was performed. Limited images were obtained. There is definite soft tissue swelling around the MTP joint but no evidence of cortical irregularity or bony edema to suggest stress fracture. She does have increased neovascularity which likely represents a stress reaction. Plantar fascia is noted to be thickened and edematous.      Assessment & Plan:   Right foot pain and swelling-question  distal second metatarsal stress reaction Chronic plantar fasciitis  Postop shoe for the next 2 weeks. Patient will follow-up with me at the end of that time for reevaluation. We will repeat her ultrasound at that visit. We will also discuss new custom orthotics and a later date. Patient will call with questions or concerns prior to her follow-up visit.

## 2017-10-29 DIAGNOSIS — F341 Dysthymic disorder: Secondary | ICD-10-CM | POA: Diagnosis not present

## 2017-10-29 DIAGNOSIS — F332 Major depressive disorder, recurrent severe without psychotic features: Secondary | ICD-10-CM | POA: Diagnosis not present

## 2017-10-29 DIAGNOSIS — F411 Generalized anxiety disorder: Secondary | ICD-10-CM | POA: Diagnosis not present

## 2017-11-02 ENCOUNTER — Ambulatory Visit (INDEPENDENT_AMBULATORY_CARE_PROVIDER_SITE_OTHER): Payer: BLUE CROSS/BLUE SHIELD | Admitting: Sports Medicine

## 2017-11-02 VITALS — BP 108/68 | Ht 66.0 in | Wt 141.0 lb

## 2017-11-02 DIAGNOSIS — M79672 Pain in left foot: Secondary | ICD-10-CM | POA: Diagnosis not present

## 2017-11-02 DIAGNOSIS — M79671 Pain in right foot: Secondary | ICD-10-CM | POA: Diagnosis not present

## 2017-11-02 DIAGNOSIS — M79676 Pain in unspecified toe(s): Secondary | ICD-10-CM | POA: Diagnosis not present

## 2017-11-02 DIAGNOSIS — G5791 Unspecified mononeuropathy of right lower limb: Secondary | ICD-10-CM | POA: Diagnosis not present

## 2017-11-02 NOTE — Progress Notes (Signed)
Date of Visit: 11/02/2017   HPI:  Bilateral Heel Pain and Right Toe Pain, Follow up:  - was last seen on 5/2 and was diagnosed with concern for distal second metatarsal stress reaction of the right foot. She was given a post-op shoe. - she reports that her toe pain has improved significantly (about 50%). She has been using the post-op shoe but recently wearing her own shoe without much discomfort  - she still has bilateral heel pain with right greater than left. She has known history of chronic plantar fasciitis - she has not been doing specific stretching exercises for plantar fasciitis, but does yoga regularly.  - she is interested in obtaining custom orthotics   ROS: See HPI.  [place]:  PMH: Chronic Plantar Fasciitis Right Sciatic Pain  History of coronary stent   PHYSICAL EXAM: BP 108/68   Ht 5\' 6"  (1.676 m)   Wt 141 lb (64 kg)   BMI 22.76 kg/m  Gen: NAD  Right Foot: No visible erythema or swelling. Longitudinal arch appears normal while sitting and standing. Transverse arch appears flattened. Range of motion of ankle is full in all directions. Ankle Strength is 5/5 in all directions. Mild tenderness of the second MTP joint but no pain along the metatarsal bone Point tenderness slightly distal to the origin of the plantar fascia   Left Foot: No visible erythema or swelling. Longitudinal arch appears normal while sitting and standing. Transverse arch appears flattened. Range of motion of ankle is full in all directions. Ankle Strength is 5/5 in all directions. Mild tenderness of the first, second, and third MTP joint but no pain along the metatarsal bones Point tenderness slightly distal to the origin of the plantar fascia   Walks with a limp.    Limited Ultrasound of the Right Foot:  No soft tissue swelling around the second MTP joint or along the metatarsal bone (improved from prior imaging study). No cortical irregularity or bony edema.    ASSESSMENT/PLAN:  Chronic Plantar Fasciitis:  Custom orthotics made today. Provided stretching exercises. She is also willing to try night sock.  Consider steroid injections if not improving.   Second Metatarsal Stress Reaction of the Right Foot, improved:  Symptoms improved significantly. Ultrasound of the area appears normal. Recommended that she can wear the post op shoe for 1 more week or her regular shoes as long as she does not experience discomfort in this region.   Follow up in 4 weeks   Cynthia Houseman, MD PGY [place]  Patient seen and evaluated with the resident. I agree with the above plan of care. Orthotics were constructed for her today. Total time of 30 minutes was spent with this patient with greater than 50% of the time spent in face-to-face consultation performing ultrasound and discussing orthotic construction, instruction, and fitting. She'll also start some plantar fascial stretching and is considering purchasing a [place] sock. Of note, patient has chronic right leg neuropathy of unknown etiology. She has not been evaluated for this in several years. I offered her a referral to Dr. Jaynee Eagles and the patient would like to meet with her. Patient will follow-up with me in 4 weeks.  Patient was fitted for a : standard, cushioned, semi-rigid orthotic. The orthotic was heated and afterward the patient stood on the orthotic blank positioned on the orthotic stand. The patient was positioned in subtalar neutral position and 10 degrees of ankle dorsiflexion in a weight bearing stance. After completion  of molding, a stable base was applied to the orthotic blank. The blank was ground to a stable position for weight bearing. Size: 8 Base: Blue EVA Posting: none Additional orthotic padding: none

## 2017-11-11 DIAGNOSIS — F331 Major depressive disorder, recurrent, moderate: Secondary | ICD-10-CM | POA: Diagnosis not present

## 2017-11-18 DIAGNOSIS — F331 Major depressive disorder, recurrent, moderate: Secondary | ICD-10-CM | POA: Diagnosis not present

## 2017-12-02 ENCOUNTER — Encounter: Payer: BLUE CROSS/BLUE SHIELD | Admitting: Sports Medicine

## 2017-12-02 ENCOUNTER — Ambulatory Visit
Admission: RE | Admit: 2017-12-02 | Discharge: 2017-12-02 | Disposition: A | Discharge status: Home / Self Care | Payer: BLUE CROSS/BLUE SHIELD | Source: Ambulatory Visit | Attending: Sports Medicine | Admitting: Sports Medicine

## 2017-12-02 ENCOUNTER — Ambulatory Visit (INDEPENDENT_AMBULATORY_CARE_PROVIDER_SITE_OTHER): Payer: BLUE CROSS/BLUE SHIELD | Admitting: Sports Medicine

## 2017-12-02 VITALS — BP 118/82 | Ht 66.0 in | Wt 143.0 lb

## 2017-12-02 DIAGNOSIS — M79676 Pain in unspecified toe(s): Secondary | ICD-10-CM

## 2017-12-02 DIAGNOSIS — F331 Major depressive disorder, recurrent, moderate: Secondary | ICD-10-CM | POA: Diagnosis not present

## 2017-12-02 DIAGNOSIS — M19071 Primary osteoarthritis, right ankle and foot: Secondary | ICD-10-CM | POA: Diagnosis not present

## 2017-12-02 NOTE — Progress Notes (Addendum)
   Subjective:    Patient ID: Cynthia Poole, female    DOB: [birth year], 62 y.o.   MRN: [id number]  HPI Cynthia Poole is a 62 yo F that presents to clinic for follow-up of bilateral plantar fascitis and distal second metatarsal pain.   She was last seen on 11/02/2017 in Sports Medicine clinic and was given custom orthotics for her plantar fascitis and was told to continue her post-op shoe for another week or more if experiences discomfort with regular shoes.   She reports that her plantar fascitis is improved; however, she thinks this is due to her limited exercise secondary to her second metatarsal pain as she has not been able to wear the custom orthotics much since receiving them one month ago. She has not been performing her exercises secondary to pain but this has improved. She attempted the [place] sock but was unable to sleep and is no longer using this.   As far as her right second metatarsal pain, she has continued to experience pain with walking but otherwise says that it has improved. Describes as aching. There is less swelling, but will swell if she is exercising or walking. She has been avoiding yoga, zumba, and swimming secondary to continued discomfort and her toe not feeling "normal". No numbness or tingling.    Review of Systems As per HPI    Objective:   Physical Exam General: well appearing female in no acute distress CV: warm and well perfused Lungs: comfortable work of breathing MSK: Right foot: No visible erythema or swelling. Full range of motion of ankle without pain. Ankle strength 5/5. Mild tenderness of the second MTP joint. Left foot: No visible erythema or swelling. Full range of motion of ankle without pain. Ankle strength 5/5. No tenderness to palpation of MTP joints.      Assessment & Plan:  Cynthia Poole is a 62 yo female that presents to clinic for follow up of bilateral chronic plantar fascitis and right second metatarsal stress reaction.   Chronic plantar  fascitis: continue to use custom orthotics when not wearing post-op shoe and continue to perform exercises. Once able to exercise again, if starts to flare, will re-evaluate in clinic.  Second Metatarsal Stress Reaction of Right Foot: Expect that if this were a stress fracture, that it would have mostly healed since initial injury in April. As she has continued pain preventing her from her daily activities, will plan to obtain a MRI of the right foot as her XR and ultrasound were inconclusive. Will follow up results with patient and make decision with how to proceed.   Patient seen and evaluated with the resident. I agree with the above plan of care. MRI of the right foot to rule out occult stress fracture. Phone follow-up with those results when available. We will delineate further treatment based on those findings.  Addendum: MRI of the right foot is reviewed. No evidence of stress fracture. There is second MTP joint arthropathy with effusion and synovitis. This is her pain generator. We will try a course of meloxicam 15 mg daily for 5 days then as needed. If symptoms persist then I would consider a cortisone injection as the next step in treatment.

## 2017-12-03 ENCOUNTER — Other Ambulatory Visit: Payer: Self-pay | Admitting: *Deleted

## 2017-12-03 MED ORDER — MELOXICAM 15 MG PO TABS: ORAL_TABLET | ORAL | 0 refills | Status: DC

## 2017-12-20 DIAGNOSIS — F331 Major depressive disorder, recurrent, moderate: Secondary | ICD-10-CM | POA: Diagnosis not present

## 2017-12-30 DIAGNOSIS — F331 Major depressive disorder, recurrent, moderate: Secondary | ICD-10-CM | POA: Diagnosis not present

## 2018-01-05 ENCOUNTER — Other Ambulatory Visit: Payer: Self-pay | Admitting: *Deleted

## 2018-01-05 MED ORDER — MELOXICAM 15 MG PO TABS: ORAL_TABLET | ORAL | 0 refills | Status: DC

## 2018-01-11 ENCOUNTER — Encounter

## 2018-01-11 ENCOUNTER — Ambulatory Visit (INDEPENDENT_AMBULATORY_CARE_PROVIDER_SITE_OTHER): Payer: BLUE CROSS/BLUE SHIELD | Admitting: Neurology

## 2018-01-11 ENCOUNTER — Encounter: Payer: Self-pay | Admitting: Neurology

## 2018-01-11 VITALS — BP 112/67 | HR 81 | Ht 66.0 in | Wt 148.0 lb

## 2018-01-11 DIAGNOSIS — G5603 Carpal tunnel syndrome, bilateral upper limbs: Secondary | ICD-10-CM

## 2018-01-11 DIAGNOSIS — M5417 Radiculopathy, lumbosacral region: Secondary | ICD-10-CM

## 2018-01-11 NOTE — Patient Instructions (Signed)
Emg/ncs in 6 weeks in the arms Epidural Steroid Injections low back ([facility]) Consider MRI lumbar spine if necessary and adding right leg to the emg/ncs if shots don;t work  Nurse, mental health is a test to check how well your muscles and nerves are working. This procedure includes the combined use of electromyogram (EMG) and nerve conduction study (NCS). EMG is used to look for muscular disorders. NCS, which is also called electroneurogram, measures how well your nerves are controlling your muscles. The procedures are usually performed together to check if your muscles and nerves are healthy. If the reaction to testing is abnormal, this can indicate disease or injury, such as peripheral nerve damage. Tell a health care provider about:  Any allergies you have.  All medicines you are taking, including vitamins, herbs, eye drops, creams, and over-the-counter medicines.  Any problems you or family members have had with anesthetic medicines.  Any blood disorders you have.  Any surgeries you have had.  Any medical conditions you have.  Any pacemaker you have. What are the risks? Generally, this is a safe procedure. However, problems may occur, including:  Infection where the electrodes were inserted.  Bleeding.  What happens before the procedure?  Ask your health care provider about: ? Changing or stopping your regular medicines. This is especially important if you are taking diabetes medicines or blood thinners. ? Taking medicines such as aspirin and ibuprofen. These medicines can thin your blood. Do not take these medicines before your procedure if your health care provider instructs you not to.  Your health care provider may ask you to avoid: ? Caffeine, such as coffee and tea. ? Nicotine. This includes cigarettes and anything with tobacco.  Do not use lotions or creams on the same day that you will be having the procedure. What happens during the  procedure? For EMG:  Your health care provider will ask you to stay in a position so that he or she can access the muscle that will be studied. You may be standing, sitting down, or lying down.  You may be given a medicine that numbs the area (local anesthetic).  A very thin needle that has an electrode on it will be inserted into your muscle.  Another small electrode will be placed on your skin near the muscle.  Your health care provider will ask you to continue to remain still.  The electrodes will send a signal that tells about the electrical activity of your muscles. You may see this on a monitor or hear it in the room.  After your muscles have been studied at rest, your health care provider will ask you to contract or flex your muscles. The electrodes will send a signal that tells about the electrical activity of your muscles.  Your health care provider will remove the electrodes and the electrode needles when the procedure is finished. The procedure may vary among health care providers and hospitals. For NCS:  An electrode that records your nerve activity (recording electrode) will be placed on your skin by the muscle that is being studied.  An electrode that is used as a reference (reference electrode) will be placed near the recording electrode.  A paste or gel will be applied to your skin between the recording electrode and the reference electrode.  Your nerve will be stimulated with a mild shock. Your health care provider will measure how much time it takes for your muscle to react.  Your health care provider will remove the electrodes  and the gel when the procedure is finished. The procedure may vary among health care providers and hospitals. What happens after the procedure?  It is your responsibility to obtain your test results. Ask your health care provider or the department performing the test when and how you will get your results.  Your health care provider  may: ? Give you medicines for any pain. ? Monitor the insertion sites to make sure that they stop bleeding. This information is not intended to replace advice given to you by your health care provider. Make sure you discuss any questions you have with your health care provider. Document Released: 10/09/2004 Document Revised: 11/14/2015 Document Reviewed: 07/30/2014 Elsevier Interactive Patient Education  2018 [employer].

## 2018-01-11 NOTE — Progress Notes (Signed)
[facility]    Provider:  Dr Jaynee Eagles Referring Provider: Leanna Battles, MD Primary Care Physician:  Leanna Battles, MD   CC:  Right leg pain  HPI:  Cynthia Poole is a 62 y.o. female here as a referral from Dr. Philip Aspen for right leg pain. PMHx HTN, hypothyroidism, hodgkin's disease, hld, fibromyalgia, chronic LBP.  She has had LBP an degenertive changes for many years (MRI in 2007 for chronic LBP appeared to be looking for S1 nerve impingement). She was working with a trainor in [employer], they did manual stretching. Right after that ahe had burning and sensory changes in the right leg. No pain. Like her leg is on fire 24 hours a day. Radiates to the feet and she also has sensory changes on her forearms which don;t appear to be associated. The right leg has progressed, starts at the hip and more on the back and bottom travels to the bottom of her foot. Sitting hurts, relieved with standing. Relieved with walking. When she lays flat the "fire comes" but if she lays on the couch or sofa its worse, hard surface is better. She had ESI, going to a chiropractor, conservative measures, PT, over the counter analgesic for years and no relief. Slowly progressive.. She has chronic low back pain. No weakness. No changes in bowel/bladder. Left leg is fine. No other focal neurologic deficits, associated symptoms, inciting events or modifiable factors.  Reviewed notes, labs and imaging from outside physicians, which showed:  MRI lumbar spine: report 2007:  Normal alignment and no fracture.  The bone marrow is heterogeneous, but without focal mass lesion.  This is likely due to prior therapy for Hodgkin's disease.  There are areas of fatty infiltration in the bone marrow and multiple vertebral bodies as well as more cellular marrow adjacent to the fatty infiltration.  Conus medullaris is normal.   L1-2:  Negative. L2-3:  Negative. L3-4:  Very mild disk degeneration and bulging of the disk.     L4-5:  There is a small to moderate  central disk protrusion indenting the thecal sac.  There is mild facet and ligamentum flavum hypertrophy and mild central canal stenosis.  The neural foramina are patent.   L5-S1:  Small central disk protrusion without spinal stenosis.  There is no significant impingement of the S1 nerve roots.   IMPRESSION: 1.  Central disk protrusion at L4-5 with mild central canal stenosis.  2.  Small central disk protrusion at L5-S1.   Requesting 2006 records from [facility]  hgba1c 5.5  Review of Systems: Patient complains of symptoms per HPI as well as the following symptoms: leg pain Pertinent negatives and positives per HPI. All others negative.   Social History   Socioeconomic History  . Marital status: Divorced    Spouse name: Not on file  . Number of children: 2  . Years of education: 52  . Highest education level: Not on file  Occupational History  . Occupation: Geophysical data processor  Social Needs  . Financial resource strain: Not on file  . Food insecurity:    Worry: Not on file    Inability: Not on file  . Transportation needs:    Medical: Not on file    Non-medical: Not on file  Tobacco Use  . Smoking status: Former Smoker    Packs/day: 1.00    Years: 5.00    Pack years: 5.00    Types: Cigarettes    Last attempt to quit: 1973  Years since quitting: 46.5  . Smokeless tobacco: Never Used  Substance and Sexual Activity  . Alcohol use: Yes    Alcohol/week: 4.2 oz    Types: 7 Glasses of wine per week  . Drug use: No  . Sexual activity: Not on file  Lifestyle  . Physical activity:    Days per week: Not on file    Minutes per session: Not on file  . Stress: Not on file  Relationships  . Social connections:    Talks on phone: Not on file    Gets together: Not on file    Attends religious service: Not on file    Active member of club or organization: Not on file    Attends meetings of clubs or organizations: Not on  file    Relationship status: Not on file  . Intimate partner violence:    Fear of current or ex partner: Not on file    Emotionally abused: Not on file    Physically abused: Not on file    Forced sexual activity: Not on file  Other Topics Concern  . Not on file  Social History Narrative   Caffeine daily    Lives at home alone   Right handed    Family History  Problem Relation Age of Onset  . Heart disease Maternal Grandfather   . Heart disease Paternal Grandfather   . Heart disease Unknown        uncle  . Aneurysm Father        AAA  . Hypertension Father   . Tuberculosis Father   . Breast cancer Maternal Grandmother   . Breast cancer Maternal Aunt   . Arthritis Mother   . Breast cancer Sister        x 2   . Colon cancer Neg Hx     Past Medical History:  Diagnosis Date  . Anemia   . CAD in native artery 09/29/2016   Coronary angiogram 9/76/73:ALPFXTK LV systolic function, EF 24%. Left dominant circulation. Mild disease in the ramus and LAD. Circumflex midsegment  90% by IVUS S/P stenting 4.0 x 18 mm Onyx DES.  . DDD (degenerative disc disease), cervical   . DDD (degenerative disc disease), lumbar   . Depression   . Fibromyalgia 03/2004  . GERD (gastroesophageal reflux disease)   . Heart murmur   . History of pleurisy   . HLD (hyperlipidemia)   . Hodgkin's disease 1997    S/P Chemo, Chest RT and splenectomy  . Hypertension    pt denies  . Hypothyroidism    no longer per pt   . Insomnia   . Internal and external bleeding hemorrhoids   . Migraines   . MVP (mitral valve prolapse)   . Osteopenia   . Personal history of colonic adenoma 06/29/2011   06/29/2011 - diminutive adenoma  . Seasonal allergies   . Shingles 2018  . Vitamin D deficiency     Past Surgical History:  Procedure Laterality Date  . BUNIONECTOMY    . CARDIAC CATHETERIZATION  09/29/2016  . COLONOSCOPY  09/02/2007   redundant colon, external hemorrhoids  . COLONOSCOPY  06/29/2011   diminutive  polyp, diverticulosis, external hemorrhoids  . CORONARY STENT INTERVENTION N/A 09/29/2016   Procedure: Coronary Stent Intervention;  Surgeon: Adrian Prows, MD;  Location: [facility];  Service: Cardiovascular;  Laterality: N/A;  . DEEP NECK LYMPH NODE BIOPSY / EXCISION  1996   right LN   . ESOPHAGOGASTRODUODENOSCOPY  06/29/2011  notrmal, 54 Fr dilation (dysphagia)  . INTRAVASCULAR ULTRASOUND/IVUS N/A 09/29/2016   Procedure: Intravascular Ultrasound/IVUS;  Surgeon: Adrian Prows, MD;  Location: [facility];  Service: Cardiovascular;  Laterality: N/A;  . LEFT HEART CATH AND CORONARY ANGIOGRAPHY N/A 09/29/2016   Procedure: Left Heart Cath and Coronary Angiography;  Surgeon: Adrian Prows, MD;  Location: [facility];  Service: Cardiovascular;  Laterality: N/A;  . SPLENECTOMY, TOTAL  1996  . STRABISMUS SURGERY  02/2009   left  . TONSILLECTOMY     age [age]    Current Outpatient Medications  Medication Sig Dispense Refill  . ALPRAZolam (XANAX) 1 MG tablet Take 1 tablet by mouth at bedtime.     Marland Kitchen amitriptyline (ELAVIL) 25 MG tablet Take 25 mg by mouth at bedtime.    Marland Kitchen aspirin EC 81 MG EC tablet Take 1 tablet (81 mg total) by mouth daily.    . meloxicam (MOBIC) 15 MG tablet Take one pill a day with food for 5 days and then prn thereafter 40 tablet 0  . propranolol (INDERAL) 10 MG tablet Take 5 mg by mouth daily.   2  . rosuvastatin (CRESTOR) 20 MG tablet Take 20 mg by mouth daily.    . Cholecalciferol (VITAMIN D) 2000 UNITS CAPS Take 1 capsule by mouth daily.      . nitroGLYCERIN (NITROSTAT) 0.4 MG SL tablet Place 1 tablet (0.4 mg total) under the tongue every 5 (five) minutes x 3 doses as needed for chest pain. 25 tablet 2  . REXULTI 0.5 MG TABS Take 0.5 mg by mouth daily.  0   No current facility-administered medications for this visit.     Allergies as of 01/11/2018 - Review Complete 01/11/2018  Allergen Reaction Noted  . Other  01/11/2018  . Zofran  06/29/2011    Vitals: BP  112/67 (BP Location: Right Arm, Patient Position: Sitting)   Pulse 81   Ht 5\' 6"  (1.676 m)   Wt 148 lb (67.1 kg)   BMI 23.89 kg/m  Last Weight:  Wt Readings from Last 1 Encounters:  01/11/18 148 lb (67.1 kg)   Last Height:   Ht Readings from Last 1 Encounters:  01/11/18 5\' 6"  (1.676 m)    Physical exam: Exam: Gen: NAD, conversant, well nourised, obese, well groomed                     CV: RRR, no MRG. No Carotid Bruits. No peripheral edema, warm, nontender Eyes: Conjunctivae clear without exudates or hemorrhage  Neuro: Detailed Neurologic Exam  Speech:    Speech is normal; fluent and spontaneous with normal comprehension.  Cognition:    The patient is oriented to person, place, and time;     recent and remote memory intact;     language fluent;     normal attention, concentration,     fund of knowledge Cranial Nerves:    The pupils are equal, round, and reactive to light. The fundi are normal and spontaneous venous pulsations are present. Visual fields are full to finger confrontation. Extraocular movements are intact. Trigeminal sensation is intact and the muscles of mastication are normal. The face is symmetric. The palate elevates in the midline. Hearing intact. Voice is normal. Shoulder shrug is normal. The tongue has normal motion without fasciculations.   Coordination:    Normal finger to nose and heel to shin. Normal rapid alternating movements.   Gait:    Heel-toe and tandem gait are normal.  Motor Observation:    No asymmetry, no atrophy, and no involuntary movements noted. Tone:    Normal muscle tone.    Posture:    Posture is normal. normal erect    Strength: 4+/5 weakness right S1 hip flexion, biceps fem, df. Otherwise strength is V/V in the upper and lower limbs.      Sensation: intact to LT     Reflex Exam:  DTR's:    Trace bilat AJ, Deep tendon reflexes in the upper and lower extremities are normal bilaterally.   Toes:    The toes are  downgoing bilaterally.   Clonus:    Clonus is absent.     Assessment/Plan:  62 year old with sensory and motor weakness in the right S1 distribution.  Emg/ncs in 6 weeks in the UE to eval for CTS (+Phalens maneuver) Epidural Steroid Injections right S1 ([facility]) Consider MRI lumbar spine and including right leg to the emg/ncs testing if ESI don't improve symptoms  Mattern or [place]ogry right S1>L5 radic injections  Orders Placed This Encounter  Procedures  . DG INJECT DIAG/THERA/INC NEEDLE/CATH/PLC EPI/CERV/THOR W/IMG  . NCV with EMG(electromyography)   Sarina Ill, MD  [facility] [address] [place], [place] [address]  Phone [phone] Fax [phone]

## 2018-01-12 ENCOUNTER — Encounter: Payer: Self-pay | Admitting: Neurology

## 2018-01-12 DIAGNOSIS — M5417 Radiculopathy, lumbosacral region: Secondary | ICD-10-CM | POA: Insufficient documentation

## 2018-01-20 DIAGNOSIS — F331 Major depressive disorder, recurrent, moderate: Secondary | ICD-10-CM | POA: Diagnosis not present

## 2018-02-01 DIAGNOSIS — I6523 Occlusion and stenosis of bilateral carotid arteries: Secondary | ICD-10-CM | POA: Diagnosis not present

## 2018-02-03 ENCOUNTER — Other Ambulatory Visit: Payer: BLUE CROSS/BLUE SHIELD

## 2018-02-03 DIAGNOSIS — F331 Major depressive disorder, recurrent, moderate: Secondary | ICD-10-CM | POA: Diagnosis not present

## 2018-02-09 DIAGNOSIS — I2510 Atherosclerotic heart disease of native coronary artery without angina pectoris: Secondary | ICD-10-CM | POA: Diagnosis not present

## 2018-02-09 DIAGNOSIS — I700 Atherosclerosis of aorta: Secondary | ICD-10-CM | POA: Diagnosis not present

## 2018-02-09 DIAGNOSIS — E7800 Pure hypercholesterolemia, unspecified: Secondary | ICD-10-CM | POA: Diagnosis not present

## 2018-02-09 DIAGNOSIS — Z0189 Encounter for other specified special examinations: Secondary | ICD-10-CM | POA: Diagnosis not present

## 2018-02-10 ENCOUNTER — Encounter: Payer: BLUE CROSS/BLUE SHIELD | Admitting: Neurology

## 2018-02-10 DIAGNOSIS — F331 Major depressive disorder, recurrent, moderate: Secondary | ICD-10-CM | POA: Diagnosis not present

## 2018-02-11 ENCOUNTER — Other Ambulatory Visit: Payer: BLUE CROSS/BLUE SHIELD

## 2018-02-16 DIAGNOSIS — Z1239 Encounter for other screening for malignant neoplasm of breast: Secondary | ICD-10-CM | POA: Diagnosis not present

## 2018-02-16 DIAGNOSIS — Z13820 Encounter for screening for osteoporosis: Secondary | ICD-10-CM | POA: Diagnosis not present

## 2018-02-16 DIAGNOSIS — Z1231 Encounter for screening mammogram for malignant neoplasm of breast: Secondary | ICD-10-CM | POA: Diagnosis not present

## 2018-02-22 ENCOUNTER — Ambulatory Visit (INDEPENDENT_AMBULATORY_CARE_PROVIDER_SITE_OTHER): Payer: BLUE CROSS/BLUE SHIELD | Admitting: Sports Medicine

## 2018-02-22 ENCOUNTER — Encounter: Payer: Self-pay | Admitting: Sports Medicine

## 2018-02-22 VITALS — BP 134/78 | Ht 66.0 in | Wt 143.0 lb

## 2018-02-22 DIAGNOSIS — M19079 Primary osteoarthritis, unspecified ankle and foot: Secondary | ICD-10-CM

## 2018-02-22 MED ORDER — METHYLPREDNISOLONE ACETATE 40 MG/ML IJ SUSP
20.0000 mg | Freq: Once | INTRAMUSCULAR | Status: AC
  Administered 2018-02-22: 20 mg via INTRA_ARTICULAR

## 2018-02-22 NOTE — Progress Notes (Addendum)
   HPI  CC: Right toe pain  And is a 62 year old female presents for follow-up of right toe pain.  She was found to have second MTP joint arthritis on MRI obtained in June of this year.  She was last seen on 01/05/2018 for similar pain.  Her last visit, she was given meloxicam which she states does help somewhat with the pain.  He states the pain does however continue to persist.  She states is made worse with prolonged walking.  She states it is made worse throughout the day.  She denies any numbness and tingling in her foot.  She denies any weakness of her foot.  See HPI and/or previous note for associated ROS.  Objective: BP 134/78   Ht 5\' 6"  (1.676 m)   Wt 143 lb (64.9 kg)   BMI 23.08 kg/m  Gen: NAD, well groomed, a/o x3, normal affect.  CV: Well-perfused. Warm.  Resp: Non-labored.  Neuro: Sensation intact throughout. No gross coordination deficits.  Gait: Nonpathologic posture, unremarkable stride without signs of limp or balance issues.  Right foot exam: No erythema, warmth, swelling noted.  Tenderness palpation over the second MTP joint on the dorsal aspect.  Full range of motion throughout testing.  5 out of 5 strength throughout testing.  Negative anterior drawer test.  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 Appropriate time out was taken. Area prepped and draped in usual sterile fashion. 0.5 cc of methylprednisolone 40 mg/ml plus  0.5 cc of 1% lidocaine without epinephrine was injected into the right second MTP joint using a(n) dorsal approach with US guidance (out of plane). The patient tolerated the procedure well. There were no complications. Post procedure instructions were given.  We injected Taleeya's second MTP joint today under ultrasound guidance as noted above.  She tolerated procedure well and states she had some relief following it.  She can continue with  exercises at home.  She continue with anti-inflammatory medications as needed.  We will follow-up in 6 weeks.  If she has no improvement at that time, we can consider adjusting custom orthotics for her.  Lewanda Rife, MD [facility] 02/22/2018 11:49 AM   Patient seen and evaluated with the sports medicine fellow. I agree with the above plan of care. Patient's second MTP joint on her right foot was injected today with cortisone. Patient tolerated this without difficulty. Follow-up if symptoms persist.

## 2018-02-23 ENCOUNTER — Encounter: Payer: Self-pay | Admitting: Sports Medicine

## 2018-02-23 ENCOUNTER — Ambulatory Visit
Admission: RE | Admit: 2018-02-23 | Discharge: 2018-02-23 | Disposition: A | Discharge status: Home / Self Care | Payer: BLUE CROSS/BLUE SHIELD | Source: Ambulatory Visit | Attending: Neurology | Admitting: Neurology

## 2018-02-23 DIAGNOSIS — M5418 Radiculopathy, sacral and sacrococcygeal region: Secondary | ICD-10-CM | POA: Diagnosis not present

## 2018-02-23 DIAGNOSIS — M5417 Radiculopathy, lumbosacral region: Secondary | ICD-10-CM

## 2018-02-23 MED ORDER — IOPAMIDOL (ISOVUE-M 200) INJECTION 41%
1.0000 mL | Freq: Once | INTRAMUSCULAR | Status: AC
  Administered 2018-02-23: 1 mL via EPIDURAL

## 2018-02-23 MED ORDER — METHYLPREDNISOLONE ACETATE 40 MG/ML INJ SUSP (RADIOLOG
120.0000 mg | Freq: Once | INTRAMUSCULAR | Status: AC
  Administered 2018-02-23: 120 mg via EPIDURAL

## 2018-02-23 NOTE — Discharge Instructions (Signed)

## 2018-02-24 ENCOUNTER — Encounter: Payer: BLUE CROSS/BLUE SHIELD | Admitting: Neurology

## 2018-02-24 DIAGNOSIS — F331 Major depressive disorder, recurrent, moderate: Secondary | ICD-10-CM | POA: Diagnosis not present

## 2018-02-28 DIAGNOSIS — Z95818 Presence of other cardiac implants and grafts: Secondary | ICD-10-CM | POA: Diagnosis not present

## 2018-02-28 DIAGNOSIS — J342 Deviated nasal septum: Secondary | ICD-10-CM | POA: Diagnosis not present

## 2018-02-28 DIAGNOSIS — R42 Dizziness and giddiness: Secondary | ICD-10-CM | POA: Diagnosis not present

## 2018-02-28 DIAGNOSIS — Z7289 Other problems related to lifestyle: Secondary | ICD-10-CM | POA: Diagnosis not present

## 2018-03-03 DIAGNOSIS — F331 Major depressive disorder, recurrent, moderate: Secondary | ICD-10-CM | POA: Diagnosis not present

## 2018-03-03 DIAGNOSIS — F330 Major depressive disorder, recurrent, mild: Secondary | ICD-10-CM | POA: Diagnosis not present

## 2018-03-14 DIAGNOSIS — F331 Major depressive disorder, recurrent, moderate: Secondary | ICD-10-CM | POA: Diagnosis not present

## 2018-03-16 DIAGNOSIS — Z79899 Other long term (current) drug therapy: Secondary | ICD-10-CM | POA: Diagnosis not present

## 2018-03-16 DIAGNOSIS — Z01419 Encounter for gynecological examination (general) (routine) without abnormal findings: Secondary | ICD-10-CM | POA: Diagnosis not present

## 2018-03-16 DIAGNOSIS — R51 Headache: Secondary | ICD-10-CM | POA: Diagnosis not present

## 2018-03-16 DIAGNOSIS — E559 Vitamin D deficiency, unspecified: Secondary | ICD-10-CM | POA: Diagnosis not present

## 2018-03-16 DIAGNOSIS — M859 Disorder of bone density and structure, unspecified: Secondary | ICD-10-CM | POA: Diagnosis not present

## 2018-03-16 DIAGNOSIS — Z6824 Body mass index (BMI) 24.0-24.9, adult: Secondary | ICD-10-CM | POA: Diagnosis not present

## 2018-03-31 DIAGNOSIS — F331 Major depressive disorder, recurrent, moderate: Secondary | ICD-10-CM | POA: Diagnosis not present

## 2018-04-14 DIAGNOSIS — Z79890 Hormone replacement therapy (postmenopausal): Secondary | ICD-10-CM | POA: Diagnosis not present

## 2018-04-14 DIAGNOSIS — F331 Major depressive disorder, recurrent, moderate: Secondary | ICD-10-CM | POA: Diagnosis not present

## 2018-04-18 ENCOUNTER — Ambulatory Visit: Payer: BLUE CROSS/BLUE SHIELD | Admitting: Sports Medicine

## 2018-04-18 ENCOUNTER — Ambulatory Visit (INDEPENDENT_AMBULATORY_CARE_PROVIDER_SITE_OTHER): Payer: BLUE CROSS/BLUE SHIELD | Admitting: Sports Medicine

## 2018-04-18 VITALS — BP 120/63 | Ht 66.0 in | Wt 149.0 lb

## 2018-04-18 DIAGNOSIS — M19079 Primary osteoarthritis, unspecified ankle and foot: Secondary | ICD-10-CM

## 2018-04-18 NOTE — Patient Instructions (Signed)
[facility] [phone]  Dr. Radene Journey Thursday 04/21/18 at 245p Arrival time is 215p

## 2018-04-18 NOTE — Progress Notes (Signed)
   Subjective:    Patient ID: Cynthia Poole, female    DOB: Sep 25, 1955, 62 y.o.   MRN: [id number]  HPI   Patient comes in today for follow-up on osteoarthritis of the second toe of the right foot.  Recent cortisone injection did help but did not resolve her pain.  She is curious as to what other treatment options may be available.   Review of Systems    As above Objective:   Physical Exam  Well-developed, well-nourished.  No acute distress.  Awake alert and oriented x3.  Vital signs reviewed  Right foot: Second toe demonstrates claw toe formation.  Pain as well as limited motion at the MTP joint.  Also pain at the PIP joint.  No soft tissue swelling.  Small blister is found at the PIP joint as well.  No signs of infection      Assessment & Plan:   Claw toe formation/osteoarthritis-right second toe  Patient will be referred to Dr. Radene Journey for his input.  I will defer further work-up and treatment to his discretion.  Follow-up with me as needed.

## 2018-04-21 DIAGNOSIS — F331 Major depressive disorder, recurrent, moderate: Secondary | ICD-10-CM | POA: Diagnosis not present

## 2018-04-25 DIAGNOSIS — M7741 Metatarsalgia, right foot: Secondary | ICD-10-CM | POA: Diagnosis not present

## 2018-04-28 DIAGNOSIS — F341 Dysthymic disorder: Secondary | ICD-10-CM | POA: Diagnosis not present

## 2018-05-02 DIAGNOSIS — F3341 Major depressive disorder, recurrent, in partial remission: Secondary | ICD-10-CM | POA: Diagnosis not present

## 2018-05-02 DIAGNOSIS — F411 Generalized anxiety disorder: Secondary | ICD-10-CM | POA: Diagnosis not present

## 2018-05-05 DIAGNOSIS — M9903 Segmental and somatic dysfunction of lumbar region: Secondary | ICD-10-CM | POA: Diagnosis not present

## 2018-05-05 DIAGNOSIS — F341 Dysthymic disorder: Secondary | ICD-10-CM | POA: Diagnosis not present

## 2018-05-05 DIAGNOSIS — M5431 Sciatica, right side: Secondary | ICD-10-CM | POA: Diagnosis not present

## 2018-05-05 DIAGNOSIS — M9901 Segmental and somatic dysfunction of cervical region: Secondary | ICD-10-CM | POA: Diagnosis not present

## 2018-05-05 DIAGNOSIS — F331 Major depressive disorder, recurrent, moderate: Secondary | ICD-10-CM | POA: Diagnosis not present

## 2018-05-11 DIAGNOSIS — M9903 Segmental and somatic dysfunction of lumbar region: Secondary | ICD-10-CM | POA: Diagnosis not present

## 2018-05-11 DIAGNOSIS — M5431 Sciatica, right side: Secondary | ICD-10-CM | POA: Diagnosis not present

## 2018-05-11 DIAGNOSIS — M9901 Segmental and somatic dysfunction of cervical region: Secondary | ICD-10-CM | POA: Diagnosis not present

## 2018-05-12 DIAGNOSIS — F331 Major depressive disorder, recurrent, moderate: Secondary | ICD-10-CM | POA: Diagnosis not present

## 2018-05-12 DIAGNOSIS — F341 Dysthymic disorder: Secondary | ICD-10-CM | POA: Diagnosis not present

## 2018-05-13 DIAGNOSIS — C44619 Basal cell carcinoma of skin of left upper limb, including shoulder: Secondary | ICD-10-CM | POA: Diagnosis not present

## 2018-05-13 DIAGNOSIS — C44319 Basal cell carcinoma of skin of other parts of face: Secondary | ICD-10-CM | POA: Diagnosis not present

## 2018-05-13 DIAGNOSIS — C44311 Basal cell carcinoma of skin of nose: Secondary | ICD-10-CM | POA: Diagnosis not present

## 2018-05-13 DIAGNOSIS — D225 Melanocytic nevi of trunk: Secondary | ICD-10-CM | POA: Diagnosis not present

## 2018-05-13 DIAGNOSIS — Z85828 Personal history of other malignant neoplasm of skin: Secondary | ICD-10-CM | POA: Diagnosis not present

## 2018-05-13 DIAGNOSIS — L821 Other seborrheic keratosis: Secondary | ICD-10-CM | POA: Diagnosis not present

## 2018-05-13 DIAGNOSIS — C4441 Basal cell carcinoma of skin of scalp and neck: Secondary | ICD-10-CM | POA: Diagnosis not present

## 2018-05-13 DIAGNOSIS — D2261 Melanocytic nevi of right upper limb, including shoulder: Secondary | ICD-10-CM | POA: Diagnosis not present

## 2018-05-26 DIAGNOSIS — F331 Major depressive disorder, recurrent, moderate: Secondary | ICD-10-CM | POA: Diagnosis not present

## 2018-06-02 DIAGNOSIS — F331 Major depressive disorder, recurrent, moderate: Secondary | ICD-10-CM | POA: Diagnosis not present

## 2018-06-09 DIAGNOSIS — F331 Major depressive disorder, recurrent, moderate: Secondary | ICD-10-CM | POA: Diagnosis not present

## 2018-06-16 DIAGNOSIS — C44311 Basal cell carcinoma of skin of nose: Secondary | ICD-10-CM | POA: Diagnosis not present

## 2018-06-30 DIAGNOSIS — F411 Generalized anxiety disorder: Secondary | ICD-10-CM | POA: Diagnosis not present

## 2018-06-30 DIAGNOSIS — Z4801 Encounter for change or removal of surgical wound dressing: Secondary | ICD-10-CM | POA: Diagnosis not present

## 2018-06-30 DIAGNOSIS — F332 Major depressive disorder, recurrent severe without psychotic features: Secondary | ICD-10-CM | POA: Diagnosis not present

## 2018-06-30 DIAGNOSIS — F331 Major depressive disorder, recurrent, moderate: Secondary | ICD-10-CM | POA: Diagnosis not present

## 2018-07-14 DIAGNOSIS — F331 Major depressive disorder, recurrent, moderate: Secondary | ICD-10-CM | POA: Diagnosis not present

## 2018-07-16 ENCOUNTER — Other Ambulatory Visit: Payer: Self-pay | Admitting: Cardiology

## 2018-07-16 DIAGNOSIS — I6523 Occlusion and stenosis of bilateral carotid arteries: Secondary | ICD-10-CM

## 2018-07-21 DIAGNOSIS — F331 Major depressive disorder, recurrent, moderate: Secondary | ICD-10-CM | POA: Diagnosis not present

## 2018-07-26 DIAGNOSIS — M9901 Segmental and somatic dysfunction of cervical region: Secondary | ICD-10-CM | POA: Diagnosis not present

## 2018-07-26 DIAGNOSIS — M5431 Sciatica, right side: Secondary | ICD-10-CM | POA: Diagnosis not present

## 2018-07-26 DIAGNOSIS — M9903 Segmental and somatic dysfunction of lumbar region: Secondary | ICD-10-CM | POA: Diagnosis not present

## 2018-07-28 DIAGNOSIS — F331 Major depressive disorder, recurrent, moderate: Secondary | ICD-10-CM | POA: Diagnosis not present

## 2018-08-01 DIAGNOSIS — F411 Generalized anxiety disorder: Secondary | ICD-10-CM | POA: Diagnosis not present

## 2018-08-01 DIAGNOSIS — F331 Major depressive disorder, recurrent, moderate: Secondary | ICD-10-CM | POA: Diagnosis not present

## 2018-08-03 DIAGNOSIS — M9903 Segmental and somatic dysfunction of lumbar region: Secondary | ICD-10-CM | POA: Diagnosis not present

## 2018-08-03 DIAGNOSIS — M5431 Sciatica, right side: Secondary | ICD-10-CM | POA: Diagnosis not present

## 2018-08-03 DIAGNOSIS — M9901 Segmental and somatic dysfunction of cervical region: Secondary | ICD-10-CM | POA: Diagnosis not present

## 2018-08-04 DIAGNOSIS — F331 Major depressive disorder, recurrent, moderate: Secondary | ICD-10-CM | POA: Diagnosis not present

## 2018-08-09 ENCOUNTER — Other Ambulatory Visit: Payer: Self-pay

## 2018-08-09 DIAGNOSIS — F331 Major depressive disorder, recurrent, moderate: Secondary | ICD-10-CM | POA: Diagnosis not present

## 2018-08-11 ENCOUNTER — Ambulatory Visit: Payer: Self-pay | Admitting: Cardiology

## 2018-08-25 DIAGNOSIS — F331 Major depressive disorder, recurrent, moderate: Secondary | ICD-10-CM | POA: Diagnosis not present

## 2018-08-25 DIAGNOSIS — F341 Dysthymic disorder: Secondary | ICD-10-CM | POA: Diagnosis not present

## 2018-09-01 DIAGNOSIS — F331 Major depressive disorder, recurrent, moderate: Secondary | ICD-10-CM | POA: Diagnosis not present

## 2018-10-06 DIAGNOSIS — F332 Major depressive disorder, recurrent severe without psychotic features: Secondary | ICD-10-CM | POA: Diagnosis not present

## 2018-10-06 DIAGNOSIS — F411 Generalized anxiety disorder: Secondary | ICD-10-CM | POA: Diagnosis not present

## 2018-11-03 DIAGNOSIS — F331 Major depressive disorder, recurrent, moderate: Secondary | ICD-10-CM | POA: Diagnosis not present

## 2018-11-03 DIAGNOSIS — F411 Generalized anxiety disorder: Secondary | ICD-10-CM | POA: Diagnosis not present

## 2018-12-12 DIAGNOSIS — F331 Major depressive disorder, recurrent, moderate: Secondary | ICD-10-CM | POA: Diagnosis not present

## 2018-12-19 DIAGNOSIS — M9901 Segmental and somatic dysfunction of cervical region: Secondary | ICD-10-CM | POA: Diagnosis not present

## 2018-12-19 DIAGNOSIS — M9903 Segmental and somatic dysfunction of lumbar region: Secondary | ICD-10-CM | POA: Diagnosis not present

## 2018-12-19 DIAGNOSIS — M5431 Sciatica, right side: Secondary | ICD-10-CM | POA: Diagnosis not present

## 2018-12-27 ENCOUNTER — Ambulatory Visit: Payer: BC Managed Care – PPO

## 2018-12-27 ENCOUNTER — Other Ambulatory Visit: Payer: Self-pay

## 2018-12-27 DIAGNOSIS — I6523 Occlusion and stenosis of bilateral carotid arteries: Secondary | ICD-10-CM

## 2018-12-28 DIAGNOSIS — F341 Dysthymic disorder: Secondary | ICD-10-CM | POA: Diagnosis not present

## 2018-12-28 DIAGNOSIS — F331 Major depressive disorder, recurrent, moderate: Secondary | ICD-10-CM | POA: Diagnosis not present

## 2019-01-01 ENCOUNTER — Other Ambulatory Visit: Payer: Self-pay | Admitting: Cardiology

## 2019-01-01 DIAGNOSIS — I6522 Occlusion and stenosis of left carotid artery: Secondary | ICD-10-CM

## 2019-01-05 DIAGNOSIS — F341 Dysthymic disorder: Secondary | ICD-10-CM | POA: Diagnosis not present

## 2019-01-05 DIAGNOSIS — F331 Major depressive disorder, recurrent, moderate: Secondary | ICD-10-CM | POA: Diagnosis not present

## 2019-01-10 DIAGNOSIS — F411 Generalized anxiety disorder: Secondary | ICD-10-CM | POA: Diagnosis not present

## 2019-01-10 DIAGNOSIS — F331 Major depressive disorder, recurrent, moderate: Secondary | ICD-10-CM | POA: Diagnosis not present

## 2019-01-12 ENCOUNTER — Encounter: Payer: Self-pay | Admitting: Orthopedic Surgery

## 2019-01-12 ENCOUNTER — Ambulatory Visit (INDEPENDENT_AMBULATORY_CARE_PROVIDER_SITE_OTHER): Payer: BC Managed Care – PPO | Admitting: Orthopedic Surgery

## 2019-01-12 ENCOUNTER — Other Ambulatory Visit: Payer: Self-pay

## 2019-01-12 VITALS — Ht 66.0 in | Wt 149.0 lb

## 2019-01-12 DIAGNOSIS — M79671 Pain in right foot: Secondary | ICD-10-CM | POA: Diagnosis not present

## 2019-01-12 DIAGNOSIS — F331 Major depressive disorder, recurrent, moderate: Secondary | ICD-10-CM | POA: Diagnosis not present

## 2019-01-12 DIAGNOSIS — F341 Dysthymic disorder: Secondary | ICD-10-CM | POA: Diagnosis not present

## 2019-01-16 ENCOUNTER — Encounter: Payer: Self-pay | Admitting: Orthopedic Surgery

## 2019-01-16 LAB — C-REACTIVE PROTEIN: CRP: 1.4 mg/L (ref <8.0)

## 2019-01-16 LAB — SEDIMENTATION RATE: Sed Rate: 11 mm/h (ref 0–30)

## 2019-01-16 LAB — RHEUMATOID FACTOR: Rheumatoid fact SerPl-aCnc: 26 IU/mL — ABNORMAL HIGH (ref <14)

## 2019-01-16 LAB — URIC ACID: Uric Acid, Serum: 3.4 mg/dL (ref 2.5–7.0)

## 2019-01-16 LAB — ANTI-NUCLEAR AB-TITER (ANA TITER): ANA Titer 1: 1:80 {titer} — ABNORMAL HIGH

## 2019-01-16 LAB — ANA: Anti Nuclear Antibody (ANA): POSITIVE — AB

## 2019-01-16 NOTE — Progress Notes (Signed)
Office Visit Note   Patient: Cynthia Poole           Date of Birth: 05/10/1956           MRN: [id number] Visit Date: 01/12/2019              Requested by: Leanna Battles, [place],  [place] [address] PCP: Leanna Battles, MD  Chief Complaint  Patient presents with  . Right Foot - Pain      HPI: Patient is a 63 year old woman who is seen for initial evaluation for right foot pain she has had an MRI scan last year has had steroid injections without relief she complains of painful clawing of the second toe.  Patient states she is status post bunion surgery of the right great toe.  Assessment & Plan: Visit Diagnoses:  1. Pain in right foot     Plan: Will obtain a uric acid and a rheumatologic screen.  Discussed that if the rheumatologic screen is positive I will refer her to Dr. Lula Olszewski.  Discussed that if this is secondary to pressure from her long second and third metatarsals that this should resolve with Achilles stretching orthotics and a stiff soled sneaker.  Follow-Up Instructions: Return in about 2 weeks (around 01/26/2019).   Ortho Exam  Patient is alert, oriented, no adenopathy, well-dressed, normal affect, normal respiratory effort. Patient has good pulses she is tender to palpation beneath a long second and third metatarsal head she has fixed clawing of the second toe and is maximally tender to palpation over the PIP joint of the second toe the MRI scan does show edema at the PIP joint.  This MRI scan appears unusual for a claw toe deformity and appears to be more consistent with an inflammatory arthropathy.  Imaging: No results found. No images are attached to the encounter.  Labs: Lab Results  Component Value Date   HGBA1C 5.5 09/30/2016   ESRSEDRATE 11 01/12/2019   CRP 1.4 01/12/2019   LABURIC 3.4 01/12/2019     Lab Results  Component Value Date   ALBUMIN 2.4 (L) 06/15/2011   LABURIC 3.4 01/12/2019    No results found for: MG No  results found for: VD25OH  No results found for: PREALBUMIN CBC EXTENDED Latest Ref Rng & Units 09/30/2016 09/29/2016 06/15/2011  WBC 4.0 - 10.5 K/uL 8.2 6.1 5.4  RBC 3.87 - 5.11 MIL/uL 3.71(L) 4.01 3.48(L)  HGB 12.0 - 15.0 g/dL 11.4(L) 12.3 10.7(L)  HCT 36.0 - 46.0 % 33.8(L) 36.7 31.8(L)  PLT 150 - 400 K/uL 284 287 266  NEUTROABS 1.7 - 7.7 K/uL - - -  LYMPHSABS 0.7 - 4.0 K/uL - - -     Body mass index is 24.05 kg/m.  Orders:  Orders Placed This Encounter  Procedures  . Uric acid  . Sed Rate (ESR)  . C-reactive protein  . Antinuclear Antib (ANA)  . Rheumatoid Factor  . Lipid Panel w/o Chol/HDL Ratio  . Anti-nuclear ab-titer (ANA titer)   No orders of the defined types were placed in this encounter.    Procedures: No procedures performed  Clinical Data: No additional findings.  ROS:  All other systems negative, except as noted in the HPI. Review of Systems  Objective: Vital Signs: Ht _0  (1.676 m)   Wt 149 lb (67.6 kg)   BMI 24.05 kg/m   Specialty Comments:  No specialty comments available.  PMFS History: Patient Active Problem List   Diagnosis Date Noted  .  Lumbosacral radiculopathy at S1 01/12/2018  . Unstable angina pectoris ([place]) 09/29/2016  . Esophageal dysmotility 07/27/2014  . Personal history of colonic adenoma 06/29/2011  . Lymphoma in remission ([place]) 06/14/2011    Class: Chronic  . S/P splenectomy 06/14/2011    Class: Chronic  . Mitral valve prolapse 06/14/2011    Class: Chronic  . Low back pain 06/14/2011    Class: Chronic  . Fibromyalgia 06/14/2011    Class: Chronic  . IBS (irritable bowel syndrome) 06/10/2011   Past Medical History:  Diagnosis Date  . Anemia   . CAD in native artery 09/29/2016   Coronary angiogram 12/31/17:[id number] LV systolic function, EF 49%. Left dominant circulation. Mild disease in the ramus and LAD. Circumflex midsegment  90% by IVUS S/P stenting 4.0 x 18 mm Onyx DES.  . DDD (degenerative disc disease),  cervical   . DDD (degenerative disc disease), lumbar   . Depression   . Fibromyalgia 03/2004  . GERD (gastroesophageal reflux disease)   . Heart murmur   . History of pleurisy   . HLD (hyperlipidemia)   . Hodgkin's disease 1997    S/P Chemo, Chest RT and splenectomy  . Hypertension    pt denies  . Hypothyroidism    no longer per pt   . Insomnia   . Internal and external bleeding hemorrhoids   . Migraines   . MVP (mitral valve prolapse)   . Osteopenia   . Personal history of colonic adenoma 06/29/2011   06/29/2011 - diminutive adenoma  . Seasonal allergies   . Shingles 2018  . Vitamin D deficiency     Family History  Problem Relation Age of Onset  . Heart disease Maternal Grandfather   . Heart disease Paternal Grandfather   . Heart disease Unknown        uncle  . Aneurysm Father        AAA  . Hypertension Father   . Tuberculosis Father   . Breast cancer Maternal Grandmother   . Breast cancer Maternal Aunt   . Arthritis Mother   . Breast cancer Sister        x 2   . Colon cancer Neg Hx     Past Surgical History:  Procedure Laterality Date  . BUNIONECTOMY    . CARDIAC CATHETERIZATION  09/29/2016  . COLONOSCOPY  09/02/2007   redundant colon, external hemorrhoids  . COLONOSCOPY  06/29/2011   diminutive polyp, diverticulosis, external hemorrhoids  . CORONARY STENT INTERVENTION N/A 09/29/2016   Procedure: Coronary Stent Intervention;  Surgeon: Adrian Prows, MD;  Location: [facility];  Service: Cardiovascular;  Laterality: N/A;  . DEEP NECK LYMPH NODE BIOPSY / EXCISION  1996   right LN   . ESOPHAGOGASTRODUODENOSCOPY  06/29/2011   notrmal, 54 Fr dilation (dysphagia)  . INTRAVASCULAR ULTRASOUND/IVUS N/A 09/29/2016   Procedure: Intravascular Ultrasound/IVUS;  Surgeon: Adrian Prows, MD;  Location: [facility];  Service: Cardiovascular;  Laterality: N/A;  . LEFT HEART CATH AND CORONARY ANGIOGRAPHY N/A 09/29/2016   Procedure: Left Heart Cath and Coronary Angiography;   Surgeon: Adrian Prows, MD;  Location: [facility];  Service: Cardiovascular;  Laterality: N/A;  . SPLENECTOMY, TOTAL  1996  . STRABISMUS SURGERY  02/2009   left  . TONSILLECTOMY     age 37-18   Social History   Occupational History  . Occupation: Geophysical data processor  Tobacco Use  . Smoking status: Former Smoker    Packs/day: 1.00    Years: 5.00  Pack years: 5.00    Types: Cigarettes    Quit date: 44    Years since quitting: 47.6  . Smokeless tobacco: Never Used  Substance and Sexual Activity  . Alcohol use: Yes    Alcohol/week: 7.0 standard drinks    Types: 7 Glasses of wine per week  . Drug use: No  . Sexual activity: Not on file

## 2019-01-17 ENCOUNTER — Telehealth: Payer: Self-pay

## 2019-01-17 NOTE — Telephone Encounter (Signed)
-----   Message from Newt Minion, MD sent at 01/16/2019 11:15 AM EDT ----- Call patient, all labs are normal with a slightly elevated rheumatoid factor, this is usually within the range of normal, can make a referral to rheumatology dr deveshwar if she is intestered, ANA and lipid panel still pending

## 2019-01-17 NOTE — Telephone Encounter (Signed)
I called and sw pt to advise of elevated lab level. Advised I have sent a referral request to Dr. Arlean Hopping office and am looking to hear back from them about an appt. Will call to advise.

## 2019-01-18 NOTE — Telephone Encounter (Signed)
Dr. Estanislado Pandy has it in her folder to review. I will update you with her response.

## 2019-01-18 NOTE — Telephone Encounter (Signed)
Dr. Estanislado Pandy has the referral, but has not had a chance to review it yet.  Her team will let us know once she has been able to determine if she can see her.

## 2019-01-18 NOTE — Telephone Encounter (Signed)
I sent a message about this pt to Ivin Booty to see if Dr. Estanislado Pandy would see her for a positive ANA can you check an update on this please?

## 2019-01-19 DIAGNOSIS — F331 Major depressive disorder, recurrent, moderate: Secondary | ICD-10-CM | POA: Diagnosis not present

## 2019-01-20 NOTE — Telephone Encounter (Signed)
Pt is sch with SD 02/14/19

## 2019-01-26 ENCOUNTER — Ambulatory Visit: Payer: BC Managed Care – PPO | Admitting: Orthopedic Surgery

## 2019-01-31 NOTE — Progress Notes (Signed)
Office Visit Note  Patient: Cynthia Poole             Date of Birth: September 28, 1955           MRN: [id number]             PCP: Leanna Battles, MD Referring: Newt Minion, MD Visit Date: 02/14/2019 Occupation: Director of a program  Subjective:  Pain in right foot.Marland Kitchen   History of Present Illness: Cynthia Poole is a 63 y.o. female seen in consultation per request of Dr. Sharol Given.  According to patient she is to walk about 2 miles a day.  About a year ago she started noticing swelling in her right foot with pain and swelling in her right second toe.  She states at that time she was seen by a sports medicine doctor who gave her a boot and meloxicam and did MRI of her foot.  He also gave a cortisone injection in the second toe per patient.  She states she did not have much improvement in her symptoms.  He states the swelling persisted and she basically lived with those symptoms.  She does not take any medications.  This year she saw Dr. Sharol Given in June who evaluated her MRI and decided to do some lab work based on the inflammation.  Her rheumatoid factor and ANA was positive for that reason she was referred to me.  She states she has swelling in her foot off and on.  Today the swelling is not much.  She states she has discomfort in her bilateral feet with walking.  She has noticed some stiffness in her hands due to osteoarthritis but no joint swelling.  She is also had some arthritis in her neck and lower back.  She states is mild.  None of her other joints are painful.  There is no family history of autoimmune disease.  Activities of Daily Living:  Patient reports morning stiffness for 30 minutes.   Patient Denies nocturnal pain.  Difficulty dressing/grooming: Denies Difficulty climbing stairs: Denies Difficulty getting out of chair: Denies Difficulty using hands for taps, buttons, cutlery, and/or writing: Denies  Review of Systems  Constitutional: Positive for fatigue. Negative for night sweats, weight  gain and weight loss.  HENT: Positive for mouth dryness. Negative for mouth sores, trouble swallowing, trouble swallowing and nose dryness.   Eyes: Positive for dryness. Negative for pain, redness and visual disturbance.  Respiratory: Negative for cough, shortness of breath and difficulty breathing.   Cardiovascular: Negative for chest pain, palpitations, hypertension, irregular heartbeat and swelling in legs/feet.  Gastrointestinal: Positive for constipation. Negative for blood in stool and diarrhea.  Endocrine: Negative for increased urination.  Genitourinary: Negative for vaginal dryness.  Musculoskeletal: Positive for arthralgias, joint pain, joint swelling and morning stiffness. Negative for myalgias, muscle weakness, muscle tenderness and myalgias.  Skin: Negative for color change, rash, hair loss, skin tightness, ulcers and sensitivity to sunlight.  Allergic/Immunologic: Negative for susceptible to infections.  Neurological: Negative for dizziness, memory loss, night sweats and weakness.  Hematological: Negative for swollen glands.  Psychiatric/Behavioral: Positive for depressed mood and sleep disturbance. The patient is nervous/anxious.     PMFS History:  Patient Active Problem List   Diagnosis Date Noted   Anxiety and depression 02/14/2019   Lumbosacral radiculopathy at S1 01/12/2018   Unstable angina pectoris ([place]) 09/29/2016   Esophageal dysmotility 07/27/2014   Personal history of colonic adenoma 06/29/2011   Lymphoma in remission ([place]) 06/14/2011    Class:  Chronic   S/P splenectomy 06/14/2011    Class: Chronic   Mitral valve prolapse 06/14/2011    Class: Chronic   Low back pain 06/14/2011    Class: Chronic   Fibromyalgia 06/14/2011    Class: Chronic   IBS (irritable bowel syndrome) 06/10/2011    Past Medical History:  Diagnosis Date   Anemia    CAD in native artery 09/29/2016   Coronary angiogram 9/62/95:[id number] LV systolic function, EF 40%. Left  dominant circulation. Mild disease in the ramus and LAD. Circumflex midsegment  90% by IVUS S/P stenting 4.0 x 18 mm Onyx DES.   DDD (degenerative disc disease), cervical    DDD (degenerative disc disease), lumbar    Depression    Fibromyalgia 03/2004   GERD (gastroesophageal reflux disease)    Heart murmur    History of pleurisy    HLD (hyperlipidemia)    Hodgkin's disease 1997    S/P Chemo, Chest RT and splenectomy   Hypertension    pt denies   Hypothyroidism    no longer per pt    Insomnia    Internal and external bleeding hemorrhoids    Migraines    MVP (mitral valve prolapse)    Osteopenia    Personal history of colonic adenoma 06/29/2011   06/29/2011 - diminutive adenoma   Seasonal allergies    Shingles 2018   Vitamin D deficiency     Family History  Problem Relation Age of Onset   Heart disease Maternal Grandfather    Heart disease Paternal Grandfather    Aneurysm Father        AAA   Hypertension Father    Tuberculosis Father    Breast cancer Maternal Grandmother    Breast cancer Maternal Aunt    Arthritis Mother    Breast cancer Sister        x 2    Healthy Son    Healthy Daughter    Colon cancer Neg Hx    Past Surgical History:  Procedure Laterality Date   BUNIONECTOMY     CARDIAC CATHETERIZATION  09/29/2016   COLONOSCOPY  09/02/2007   redundant colon, external hemorrhoids   COLONOSCOPY  06/29/2011   diminutive polyp, diverticulosis, external hemorrhoids   CORONARY STENT INTERVENTION N/A 09/29/2016   Procedure: Coronary Stent Intervention;  Surgeon: Adrian Prows, MD;  Location: [facility];  Service: Cardiovascular;  Laterality: N/A;   DEEP NECK LYMPH NODE BIOPSY / EXCISION  1996   right LN    ESOPHAGOGASTRODUODENOSCOPY  06/29/2011   notrmal, 54 Fr dilation (dysphagia)   INTRAVASCULAR ULTRASOUND/IVUS N/A 09/29/2016   Procedure: Intravascular Ultrasound/IVUS;  Surgeon: Adrian Prows, MD;  Location: [facility];   Service: Cardiovascular;  Laterality: N/A;   LEFT HEART CATH AND CORONARY ANGIOGRAPHY N/A 09/29/2016   Procedure: Left Heart Cath and Coronary Angiography;  Surgeon: Adrian Prows, MD;  Location: [facility];  Service: Cardiovascular;  Laterality: N/A;   SPLENECTOMY, TOTAL  1996   STRABISMUS SURGERY  02/2009   left   TONSILLECTOMY     age 45-18   Social History   Social History Narrative   Caffeine daily    Lives at home alone   Right handed    There is no immunization history on file for this patient.   Objective: Vital Signs: BP 126/77 (BP Location: Right Arm, Patient Position: Sitting, Cuff Size: Normal)    Pulse 82    Resp 12    Ht 5' 5.5" (1.664 m)  Wt 152 lb (68.9 kg)    BMI 24.91 kg/m    Physical Exam Vitals signs and nursing note reviewed.  Constitutional:      Appearance: She is well-developed.  HENT:     Head: Normocephalic and atraumatic.  Eyes:     Conjunctiva/sclera: Conjunctivae normal.  Neck:     Musculoskeletal: Normal range of motion.  Cardiovascular:     Rate and Rhythm: Normal rate and regular rhythm.     Heart sounds: Normal heart sounds.  Pulmonary:     Effort: Pulmonary effort is normal.     Breath sounds: Normal breath sounds.  Abdominal:     General: Bowel sounds are normal.     Palpations: Abdomen is soft.  Lymphadenopathy:     Cervical: No cervical adenopathy.  Skin:    General: Skin is warm and dry.     Capillary Refill: Capillary refill takes less than 2 seconds.  Neurological:     Mental Status: She is alert and oriented to person, place, and time.  Psychiatric:        Behavior: Behavior normal.      Musculoskeletal Exam: C-spine thoracic and lumbar spine were in good range of motion.  She had no SI joint tenderness.  Shoulder joints, elbow joints, wrist joints with good range of motion.  She has bilateral CMC PIP and DIP thickening consistent with osteoarthritis.  No synovitis was noted.  Hip joints and knee joints with good  range of motion.  Ankle joints with good range of motion.  She has bilateral first MTP PIP and DIP thickening with tenderness across all MTP joints.  No synovitis or swelling was noted.  The most tender joint was her right second MTP.  CDAI Exam: CDAI Score: -- Patient Global: --; Provider Global: -- Swollen: --; Tender: -- Joint Exam   No joint exam has been documented for this visit   There is currently no information documented on the homunculus. Go to the Rheumatology activity and complete the homunculus joint exam.  Investigation: Findings:  01/12/19: ANA 1:80 NH, uric acid 3.4, sed rate 11, CPR 1.4, RF 26  Component     Latest Ref Rng & Units 01/12/2019  ANA Titer 1     titer 1:80 (H)  ANA Pattern 1      Nuclear, Homogeneous (A)  Uric Acid, Serum     2.5 - 7.0 mg/dL 3.4  Sed Rate     0 - 30 mm/h 11  CRP     <8.0 mg/L 1.4  Anti Nuclear Antibody (ANA)     NEGATIVE POSITIVE (A)  RA Latex Turbid.     <14 IU/mL 26 (H)   Imaging: Xr Foot 2 Views Left  Result Date: 02/14/2019 First MTP, PIP and DIP narrowing was noted.  No narrowing of the other MTP joints or erosive changes were noted.  No intertarsal, tibiotalar or subtalar joint space narrowing was noted. Impression: These findings are consistent with osteoarthritis of the foot.  Xr Foot 2 Views Right  Result Date: 02/14/2019 First MTP, PIP and DIP joint space narrowing was noted.  Mild subluxation was noted of the second MTP joint without any erosive changes.  No erosions were noted in any other MTP joints.  No intertarsal, subtalar or tibiotalar joint space narrowing was noted.  Inferior calcaneal spur was noted. Impression: These findings are consistent with osteoarthritis of the foot.   Recent Labs: Lab Results  Component Value Date   WBC 8.2 09/30/2016  HGB 11.4 (L) 09/30/2016   PLT 284 09/30/2016   NA 136 09/30/2016   K 5.4 (H) 09/30/2016   CL 103 09/30/2016   CO2 26 09/30/2016   GLUCOSE 109 (H) 09/30/2016    BUN 11 09/30/2016   CREATININE 0.84 09/30/2016   BILITOT 0.2 (L) 06/15/2011   ALKPHOS 27 (L) 06/15/2011   AST 30 06/15/2011   ALT 16 06/15/2011   PROT 5.0 (L) 06/15/2011   ALBUMIN 2.4 (L) 06/15/2011   CALCIUM 9.5 09/30/2016   GFRAA >60 09/30/2016    Speciality Comments: No specialty comments available.  Procedures:  No procedures performed Allergies: Other and Zofran   Assessment / Plan:     Visit Diagnoses: Pain in both feet -patient gives history of ongoing pain in her feet for last 1 year.  The right second MTP has been more painful.  She also gives history of intermittent swelling in the toe.  I reviewed the MRI of her foot from last year which showed some inflammatory and erosive changes in the second MTP joint.  She had no synovitis on examination today.  The most recent labs done by Dr. Sharol Given showed positive rheumatoid factor titer and positive ANA low titer.  She denies any history of psoriasis or any other autoimmune diseases.  There is no family history of autoimmune disease.  The x-rays obtained today are consistent with osteoarthritis.  Plan: XR Foot 2 Views Right, XR Foot 2 Views Left, Cyclic citrul peptide antibody, IgG, 14-3-3 eta Protein.  Have been obtain additional labs.  I have also advised her to contact me if she develops any increased swelling in her foot which can be visualized.  I have given her a list of natural anti-inflammatories.  Proper fitting shoes were discussed.  She was advised to use topical diclofenac gel.  Side effects were discussed.  Primary osteoarthritis of both hands-she has some stiffness in her hands.  The clinical findings are consistent with osteoarthritis.  Positive ANA (antinuclear antibody) - 01/12/19: ANA 1:80 NH, uric acid 3.4, sed rate 11, CPR 1.4, RF 26 -her ANA is low titer.  She also gives history of dysphagia.  I will obtain following labs to complete the work-up.  Plan: Anti-DNA antibody, double-stranded, Anti-scleroderma antibody, RNP  Antibody, Anti-Smith antibody, Sjogrens syndrome-A extractable nuclear antibody, Sjogrens syndrome-B extractable nuclear antibody, C3 and C4  Lumbosacral radiculopathy at S1-patient states she has lower back discomfort off and on.  She also believes that she has underlying disc disease of cervical spine.  I do not have x-rays to confirm it.  Medical problems are listed as follows:  Lymphoma in remission ([place]) - Hodgkin's Dxd 1996  S/P splenectomy  Personal history of colonic adenoma  Esophageal dysmotility  History of IBS  Mitral valve prolapse  Anxiety and depression  Orders: Orders Placed This Encounter  Procedures   XR Foot 2 Views Right   XR Foot 2 Views Left   Cyclic citrul peptide antibody, IgG   14-3-3 eta Protein   Anti-DNA antibody, double-stranded   Anti-scleroderma antibody   RNP Antibody   Anti-Smith antibody   Sjogrens syndrome-A extractable nuclear antibody   Sjogrens syndrome-B extractable nuclear antibody   C3 and C4   No orders of the defined types were placed in this encounter.   Face-to-face time spent with patient was 45 minutes. Greater than 50% of time was spent in counseling and coordination of care.  Follow-Up Instructions: Return for Osteoarthritis, positive ANA, positive RF.   Bo Merino, MD  Note - This record has been created using [employer].  Chart creation errors have been sought, but may not always  have been located. Such creation errors do not reflect on  the standard of medical care.

## 2019-02-08 DIAGNOSIS — F330 Major depressive disorder, recurrent, mild: Secondary | ICD-10-CM | POA: Diagnosis not present

## 2019-02-08 DIAGNOSIS — F411 Generalized anxiety disorder: Secondary | ICD-10-CM | POA: Diagnosis not present

## 2019-02-14 ENCOUNTER — Encounter: Payer: Self-pay | Admitting: Rheumatology

## 2019-02-14 ENCOUNTER — Ambulatory Visit (INDEPENDENT_AMBULATORY_CARE_PROVIDER_SITE_OTHER): Payer: BC Managed Care – PPO | Admitting: Rheumatology

## 2019-02-14 ENCOUNTER — Ambulatory Visit: Payer: Self-pay

## 2019-02-14 ENCOUNTER — Other Ambulatory Visit: Payer: Self-pay

## 2019-02-14 VITALS — BP 126/77 | HR 82 | Resp 12 | Ht 65.5 in | Wt 152.0 lb

## 2019-02-14 DIAGNOSIS — R768 Other specified abnormal immunological findings in serum: Secondary | ICD-10-CM

## 2019-02-14 DIAGNOSIS — Z86010 Personal history of colonic polyps: Secondary | ICD-10-CM

## 2019-02-14 DIAGNOSIS — M19041 Primary osteoarthritis, right hand: Secondary | ICD-10-CM

## 2019-02-14 DIAGNOSIS — Z9081 Acquired absence of spleen: Secondary | ICD-10-CM

## 2019-02-14 DIAGNOSIS — K224 Dyskinesia of esophagus: Secondary | ICD-10-CM

## 2019-02-14 DIAGNOSIS — F329 Major depressive disorder, single episode, unspecified: Secondary | ICD-10-CM

## 2019-02-14 DIAGNOSIS — C8590 Non-Hodgkin lymphoma, unspecified, unspecified site: Secondary | ICD-10-CM

## 2019-02-14 DIAGNOSIS — M19042 Primary osteoarthritis, left hand: Secondary | ICD-10-CM

## 2019-02-14 DIAGNOSIS — I341 Nonrheumatic mitral (valve) prolapse: Secondary | ICD-10-CM

## 2019-02-14 DIAGNOSIS — M79671 Pain in right foot: Secondary | ICD-10-CM

## 2019-02-14 DIAGNOSIS — Z8719 Personal history of other diseases of the digestive system: Secondary | ICD-10-CM

## 2019-02-14 DIAGNOSIS — M79672 Pain in left foot: Secondary | ICD-10-CM | POA: Diagnosis not present

## 2019-02-14 DIAGNOSIS — F419 Anxiety disorder, unspecified: Secondary | ICD-10-CM

## 2019-02-14 DIAGNOSIS — M5417 Radiculopathy, lumbosacral region: Secondary | ICD-10-CM | POA: Diagnosis not present

## 2019-02-14 DIAGNOSIS — F32A Depression, unspecified: Secondary | ICD-10-CM

## 2019-02-18 LAB — SJOGRENS SYNDROME-A EXTRACTABLE NUCLEAR ANTIBODY: SSA (Ro) (ENA) Antibody, IgG: <1 AI

## 2019-02-18 LAB — ANTI-DNA ANTIBODY, DOUBLE-STRANDED: ds DNA Ab: 1 IU/mL

## 2019-02-18 LAB — ANTI-SMITH ANTIBODY: ENA SM Ab Ser-aCnc: <1 AI

## 2019-02-18 LAB — C3 AND C4
C3 Complement: 105 mg/dL (ref 83–193)
C4 Complement: 33 mg/dL (ref 15–57)

## 2019-02-18 LAB — RNP ANTIBODY: Ribonucleic Protein(ENA) Antibody, IgG: <1 AI

## 2019-02-18 LAB — 14-3-3 ETA PROTEIN: 14-3-3 eta Protein: <0.2 ng/mL (ref <0.2)

## 2019-02-18 LAB — CYCLIC CITRUL PEPTIDE ANTIBODY, IGG: Cyclic Citrullin Peptide Ab: <16 UNITS

## 2019-02-18 LAB — SJOGRENS SYNDROME-B EXTRACTABLE NUCLEAR ANTIBODY: SSB (La) (ENA) Antibody, IgG: <1 AI

## 2019-02-18 LAB — ANTI-SCLERODERMA ANTIBODY: Scleroderma (Scl-70) (ENA) Antibody, IgG: <1 AI

## 2019-02-20 NOTE — Progress Notes (Signed)
I will discuss results at the follow-up visit.

## 2019-02-21 DIAGNOSIS — F331 Major depressive disorder, recurrent, moderate: Secondary | ICD-10-CM | POA: Diagnosis not present

## 2019-03-01 DIAGNOSIS — F331 Major depressive disorder, recurrent, moderate: Secondary | ICD-10-CM | POA: Diagnosis not present

## 2019-03-03 NOTE — Progress Notes (Signed)
Office Visit Note  Patient: Cynthia Poole             Date of Birth: 10/27/1955           MRN: [id number]             PCP: Leanna Battles, MD Referring: Leanna Battles, MD Visit Date: 03/13/2019 Occupation: @GUAROCC @  Subjective:  Pain in both feet.   History of Present Illness: Cynthia Poole is a 63 y.o. female with history of osteoarthritis and fibromyalgia.  She states she continues to have pain and discomfort in her bilateral feet.    She has not noticed any joint swelling but she continues to have discomfort in her toes.  She has some discomfort in other joints but no joint swelling  Activities of Daily Living:  Patient reports morning stiffness for 1 hour.   Patient Denies nocturnal pain.  Difficulty dressing/grooming: Denies Difficulty climbing stairs: Denies Difficulty getting out of chair: Denies Difficulty using hands for taps, buttons, cutlery, and/or writing: Reports  Review of Systems  Constitutional: Negative for fatigue, night sweats, weight gain and weight loss.  HENT: Positive for mouth dryness and nose dryness. Negative for mouth sores, trouble swallowing and trouble swallowing.   Eyes: Negative for pain, redness, itching, visual disturbance and dryness.  Respiratory: Negative for cough, shortness of breath, wheezing and difficulty breathing.   Cardiovascular: Negative for chest pain, palpitations, hypertension, irregular heartbeat and swelling in legs/feet.  Gastrointestinal: Negative for blood in stool, constipation and diarrhea.  Endocrine: Negative for increased urination.  Genitourinary: Negative for difficulty urinating, painful urination and vaginal dryness.  Musculoskeletal: Positive for arthralgias, joint pain and morning stiffness. Negative for joint swelling, myalgias, muscle weakness, muscle tenderness and myalgias.  Skin: Negative for color change, rash, hair loss, skin tightness, ulcers and sensitivity to sunlight.  Allergic/Immunologic: Negative  for susceptible to infections.  Neurological: Positive for dizziness. Negative for headaches, memory loss, night sweats and weakness.  Hematological: Negative for bruising/bleeding tendency and swollen glands.  Psychiatric/Behavioral: Negative for depressed mood, confusion and sleep disturbance. The patient is not nervous/anxious.     PMFS History:  Patient Active Problem List   Diagnosis Date Noted  . Anxiety and depression 02/14/2019  . Lumbosacral radiculopathy at S1 01/12/2018  . Unstable angina pectoris ([place]) 09/29/2016  . Esophageal dysmotility 07/27/2014  . Personal history of colonic adenoma 06/29/2011  . Lymphoma in remission ([place]) 06/14/2011    Class: Chronic  . S/P splenectomy 06/14/2011    Class: Chronic  . Mitral valve prolapse 06/14/2011    Class: Chronic  . Low back pain 06/14/2011    Class: Chronic  . Fibromyalgia 06/14/2011    Class: Chronic  . IBS (irritable bowel syndrome) 06/10/2011    Past Medical History:  Diagnosis Date  . Anemia   . CAD in native artery 09/29/2016   Coronary angiogram 0000000 LV systolic function, EF 123456. Left dominant circulation. Mild disease in the ramus and LAD. Circumflex midsegment  90% by IVUS S/P stenting 4.0 x 18 mm Onyx DES.  . DDD (degenerative disc disease), cervical   . DDD (degenerative disc disease), lumbar   . Depression   . Fibromyalgia 03/2004  . GERD (gastroesophageal reflux disease)   . Heart murmur   . History of pleurisy   . HLD (hyperlipidemia)   . Hodgkin's disease 1997    S/P Chemo, Chest RT and splenectomy  . Hypertension    pt denies  . Hypothyroidism    no longer  per pt   . Insomnia   . Internal and external bleeding hemorrhoids   . Migraines   . MVP (mitral valve prolapse)   . Osteopenia   . Personal history of colonic adenoma 06/29/2011   06/29/2011 - diminutive adenoma  . Seasonal allergies   . Shingles 2018  . Vitamin D deficiency     Family History  Problem Relation Age of Onset  .  Heart disease Maternal Grandfather   . Heart disease Paternal Grandfather   . Aneurysm Father        AAA  . Hypertension Father   . Tuberculosis Father   . Breast cancer Maternal Grandmother   . Breast cancer Maternal Aunt   . Arthritis Mother   . Breast cancer Sister        x 2   . Healthy Son   . Healthy Daughter   . Colon cancer Neg Hx    Past Surgical History:  Procedure Laterality Date  . BUNIONECTOMY    . CARDIAC CATHETERIZATION  09/29/2016  . COLONOSCOPY  09/02/2007   redundant colon, external hemorrhoids  . COLONOSCOPY  06/29/2011   diminutive polyp, diverticulosis, external hemorrhoids  . CORONARY STENT INTERVENTION N/A 09/29/2016   Procedure: Coronary Stent Intervention;  Surgeon: Adrian Prows, MD;  Location: [facility];  Service: Cardiovascular;  Laterality: N/A;  . DEEP NECK LYMPH NODE BIOPSY / EXCISION  1996   right LN   . ESOPHAGOGASTRODUODENOSCOPY  06/29/2011   notrmal, 54 Fr dilation (dysphagia)  . INTRAVASCULAR ULTRASOUND/IVUS N/A 09/29/2016   Procedure: Intravascular Ultrasound/IVUS;  Surgeon: Adrian Prows, MD;  Location: [facility];  Service: Cardiovascular;  Laterality: N/A;  . LEFT HEART CATH AND CORONARY ANGIOGRAPHY N/A 09/29/2016   Procedure: Left Heart Cath and Coronary Angiography;  Surgeon: Adrian Prows, MD;  Location: [facility];  Service: Cardiovascular;  Laterality: N/A;  . SPLENECTOMY, TOTAL  1996  . STRABISMUS SURGERY  02/2009   left  . TONSILLECTOMY     age [age]   Social History   Social History Narrative   Caffeine daily    Lives at home alone   Right handed    There is no immunization history on file for this patient.   Objective: Vital Signs: BP 130/80 (BP Location: Left Arm, Patient Position: Sitting, Cuff Size: Normal)   Pulse 79   Resp 12   Ht 5\' 6"  (1.676 m)   Wt 155 lb 3.2 oz (70.4 kg)   BMI 25.05 kg/m    Physical Exam Vitals signs and nursing note reviewed.  Constitutional:      Appearance: She is  well-developed.  HENT:     Head: Normocephalic and atraumatic.  Eyes:     Conjunctiva/sclera: Conjunctivae normal.  Neck:     Musculoskeletal: Normal range of motion.  Cardiovascular:     Rate and Rhythm: Normal rate and regular rhythm.     Heart sounds: Normal heart sounds.  Pulmonary:     Effort: Pulmonary effort is normal.     Breath sounds: Normal breath sounds.  Abdominal:     General: Bowel sounds are normal.     Palpations: Abdomen is soft.  Lymphadenopathy:     Cervical: No cervical adenopathy.  Skin:    General: Skin is warm and dry.     Capillary Refill: Capillary refill takes less than 2 seconds.  Neurological:     Mental Status: She is alert and oriented to person, place, and time.  Psychiatric:  Behavior: Behavior normal.      Musculoskeletal Exam: C-spine was in good range of motion.  Shoulder joints, elbow joints, wrist joints with good range of motion.  She has CMC, PIP and DIP thickening with no synovitis.  Hip joints and knee joints in good range of motion.  She has crepitus in her knee joints.  She has some first MTP PIP and DIP thickening.  She has right second hammertoe with no synovitis.  She has some tenderness on the palpation of right second MTP.  CDAI Exam: CDAI Score: - Patient Global: -; Provider Global: - Swollen: -; Tender: - Joint Exam   No joint exam has been documented for this visit   There is currently no information documented on the homunculus. Go to the Rheumatology activity and complete the homunculus joint exam.  Investigation: No additional findings.  Imaging: Xr Foot 2 Views Left  Result Date: 02/14/2019 First MTP, PIP and DIP narrowing was noted.  No narrowing of the other MTP joints or erosive changes were noted.  No intertarsal, tibiotalar or subtalar joint space narrowing was noted. Impression: These findings are consistent with osteoarthritis of the foot.  Xr Foot 2 Views Right  Result Date: 02/14/2019 First MTP,  PIP and DIP joint space narrowing was noted.  Mild subluxation was noted of the second MTP joint without any erosive changes.  No erosions were noted in any other MTP joints.  No intertarsal, subtalar or tibiotalar joint space narrowing was noted.  Inferior calcaneal spur was noted. Impression: These findings are consistent with osteoarthritis of the foot.   Recent Labs: Lab Results  Component Value Date   WBC 8.2 09/30/2016   HGB 11.4 (L) 09/30/2016   PLT 284 09/30/2016   NA 136 09/30/2016   K 5.4 (H) 09/30/2016   CL 103 09/30/2016   CO2 26 09/30/2016   GLUCOSE 109 (H) 09/30/2016   BUN 11 09/30/2016   CREATININE 0.84 09/30/2016   BILITOT 0.2 (L) 06/15/2011   ALKPHOS 27 (L) 06/15/2011   AST 30 06/15/2011   ALT 16 06/15/2011   PROT 5.0 (L) 06/15/2011   ALBUMIN 2.4 (L) 06/15/2011   CALCIUM 9.5 09/30/2016   GFRAA >60 09/30/2016  February 14, 2019 ENA negative, C3-C4 normal, anti-CCP negative, 14 3 3  eta negative 01/12/19: ANA 1:80 NH, uric acid 3.4, sed rate 11, CPR 1.4, RF 26  Speciality Comments: No specialty comments available.  Procedures:  No procedures performed Allergies: Other and Zofran   Assessment / Plan:     Visit Diagnoses: Pain in both feet - RF 26, ANA 1: 80.  History of intermittent swelling.  MRI of the foot 2019 showed questionable inflammation and possible erosive changes in the second MTP.  The x-rays obtained at the last visit did not show any erosive changes.  Patient states she has not had any recent episode of inflammation although she continues to have some stiffness in the morning and discomfort after prolonged walking.  She does walk on a regular basis.  I had detailed discussion with patient regarding the use of DMARDs as a trial but she declined.  She is concerned about the side effects.  She will notify me if she develops increased symptoms.  Primary osteoarthritis of both feet-she has underlying osteoarthritis.  X-ray findings were discussed.  Pain in  both hands -she complains of increased discomfort in her hands.  No synovitis was noted on the examination.  Plan: XR Hand 2 View Right, XR Hand 2 View Left.  The x-rays reveal PIP DIP and CMC narrowing.  Mild MCP narrowing was noted which was not significant.  No intercarpal changes were noted.  No erosive changes were noted.  I would like to repeat x-rays in 1 year.  A handout on the supplement list was given.  Primary osteoarthritis of both hands-the clinical and radiographic findings are consistent with osteoarthritis.  Lumbosacral radiculopathy at S1-chronic pain.  Lymphoma in remission ([place])  S/P splenectomy  Personal history of colonic adenoma  Esophageal dysmotility  History of IBS  Mitral valve prolapse  Anxiety and depression  Orders: Orders Placed This Encounter  Procedures  . XR Hand 2 View Right  . XR Hand 2 View Left   No orders of the defined types were placed in this encounter.     Follow-Up Instructions: Return for Osteoarthritis, positive rheumatoid factor.   Bo Merino, MD  Note - This record has been created using Editor, commissioning.  Chart creation errors have been sought, but may not always  have been located. Such creation errors do not reflect on  the standard of medical care.

## 2019-03-10 DIAGNOSIS — F331 Major depressive disorder, recurrent, moderate: Secondary | ICD-10-CM | POA: Diagnosis not present

## 2019-03-13 ENCOUNTER — Ambulatory Visit: Payer: Self-pay

## 2019-03-13 ENCOUNTER — Encounter: Payer: Self-pay | Admitting: Rheumatology

## 2019-03-13 ENCOUNTER — Ambulatory Visit (INDEPENDENT_AMBULATORY_CARE_PROVIDER_SITE_OTHER): Payer: BC Managed Care – PPO | Admitting: Rheumatology

## 2019-03-13 ENCOUNTER — Other Ambulatory Visit: Payer: Self-pay

## 2019-03-13 VITALS — BP 130/80 | HR 79 | Resp 12 | Ht 66.0 in | Wt 155.2 lb

## 2019-03-13 DIAGNOSIS — K224 Dyskinesia of esophagus: Secondary | ICD-10-CM

## 2019-03-13 DIAGNOSIS — M79641 Pain in right hand: Secondary | ICD-10-CM | POA: Diagnosis not present

## 2019-03-13 DIAGNOSIS — M5417 Radiculopathy, lumbosacral region: Secondary | ICD-10-CM

## 2019-03-13 DIAGNOSIS — Z8719 Personal history of other diseases of the digestive system: Secondary | ICD-10-CM

## 2019-03-13 DIAGNOSIS — Z860100 Personal history of colon polyps, unspecified: Secondary | ICD-10-CM

## 2019-03-13 DIAGNOSIS — M79671 Pain in right foot: Secondary | ICD-10-CM | POA: Diagnosis not present

## 2019-03-13 DIAGNOSIS — F32A Depression, unspecified: Secondary | ICD-10-CM

## 2019-03-13 DIAGNOSIS — M79642 Pain in left hand: Secondary | ICD-10-CM

## 2019-03-13 DIAGNOSIS — M19072 Primary osteoarthritis, left ankle and foot: Secondary | ICD-10-CM

## 2019-03-13 DIAGNOSIS — C859A Non-Hodgkin lymphoma, unspecified, in remission: Secondary | ICD-10-CM

## 2019-03-13 DIAGNOSIS — M19042 Primary osteoarthritis, left hand: Secondary | ICD-10-CM

## 2019-03-13 DIAGNOSIS — M19041 Primary osteoarthritis, right hand: Secondary | ICD-10-CM | POA: Diagnosis not present

## 2019-03-13 DIAGNOSIS — F419 Anxiety disorder, unspecified: Secondary | ICD-10-CM

## 2019-03-13 DIAGNOSIS — M19071 Primary osteoarthritis, right ankle and foot: Secondary | ICD-10-CM

## 2019-03-13 DIAGNOSIS — C8590 Non-Hodgkin lymphoma, unspecified, unspecified site: Secondary | ICD-10-CM

## 2019-03-13 DIAGNOSIS — F329 Major depressive disorder, single episode, unspecified: Secondary | ICD-10-CM

## 2019-03-13 DIAGNOSIS — M79672 Pain in left foot: Secondary | ICD-10-CM

## 2019-03-13 DIAGNOSIS — Z9081 Acquired absence of spleen: Secondary | ICD-10-CM

## 2019-03-13 DIAGNOSIS — I341 Nonrheumatic mitral (valve) prolapse: Secondary | ICD-10-CM

## 2019-03-14 DIAGNOSIS — F331 Major depressive disorder, recurrent, moderate: Secondary | ICD-10-CM | POA: Diagnosis not present

## 2019-03-14 DIAGNOSIS — R4681 Obsessive-compulsive behavior: Secondary | ICD-10-CM | POA: Diagnosis not present

## 2019-03-14 DIAGNOSIS — F330 Major depressive disorder, recurrent, mild: Secondary | ICD-10-CM | POA: Diagnosis not present

## 2019-03-18 IMAGING — CR DG CHEST 2V
2 series · 2 of 2 positions shown · non-contrast
Comparison: None.

CLINICAL DATA: Mid chest pain, recently radiating to the back

EXAM:
CHEST  2 VIEW

[chest pa]
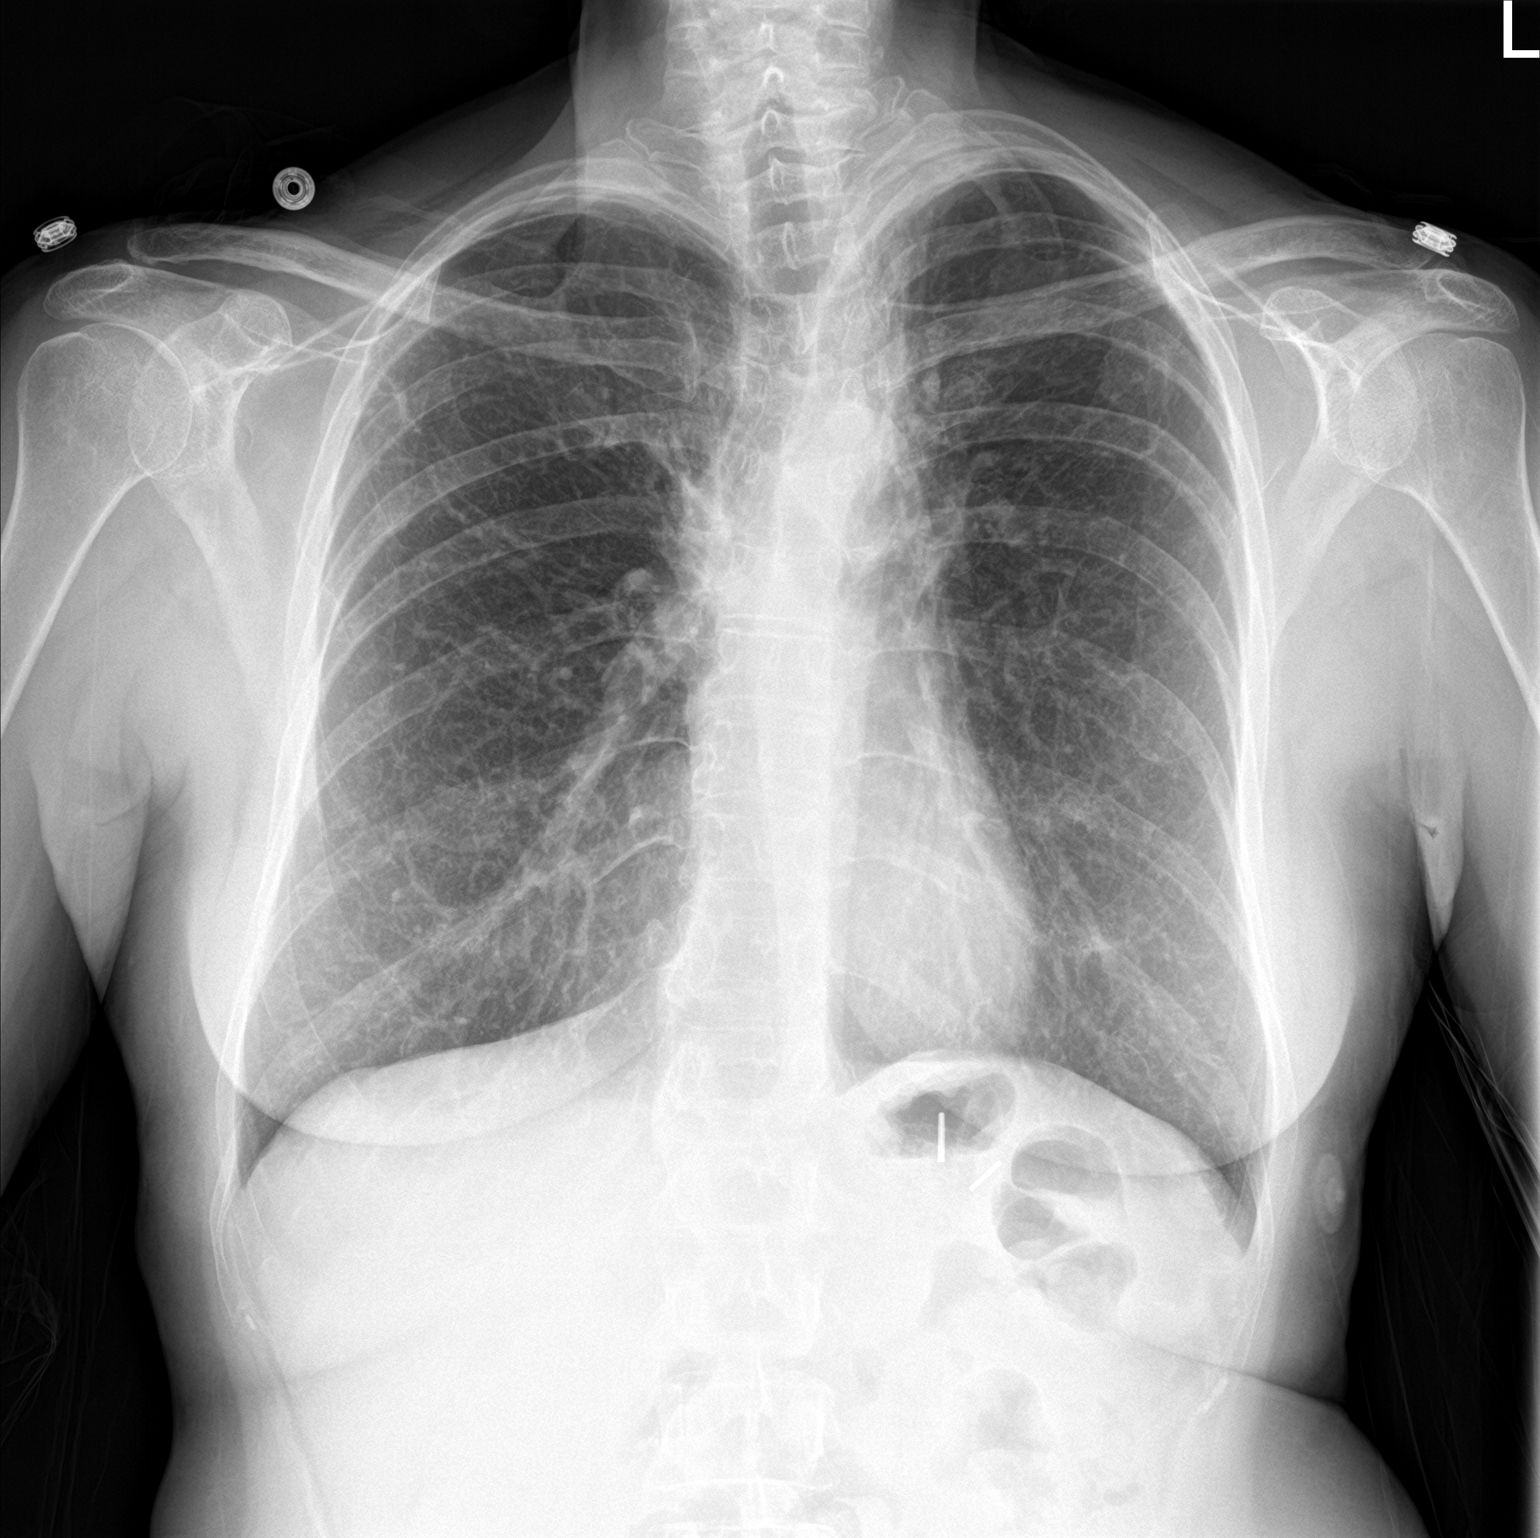

[chest lat]
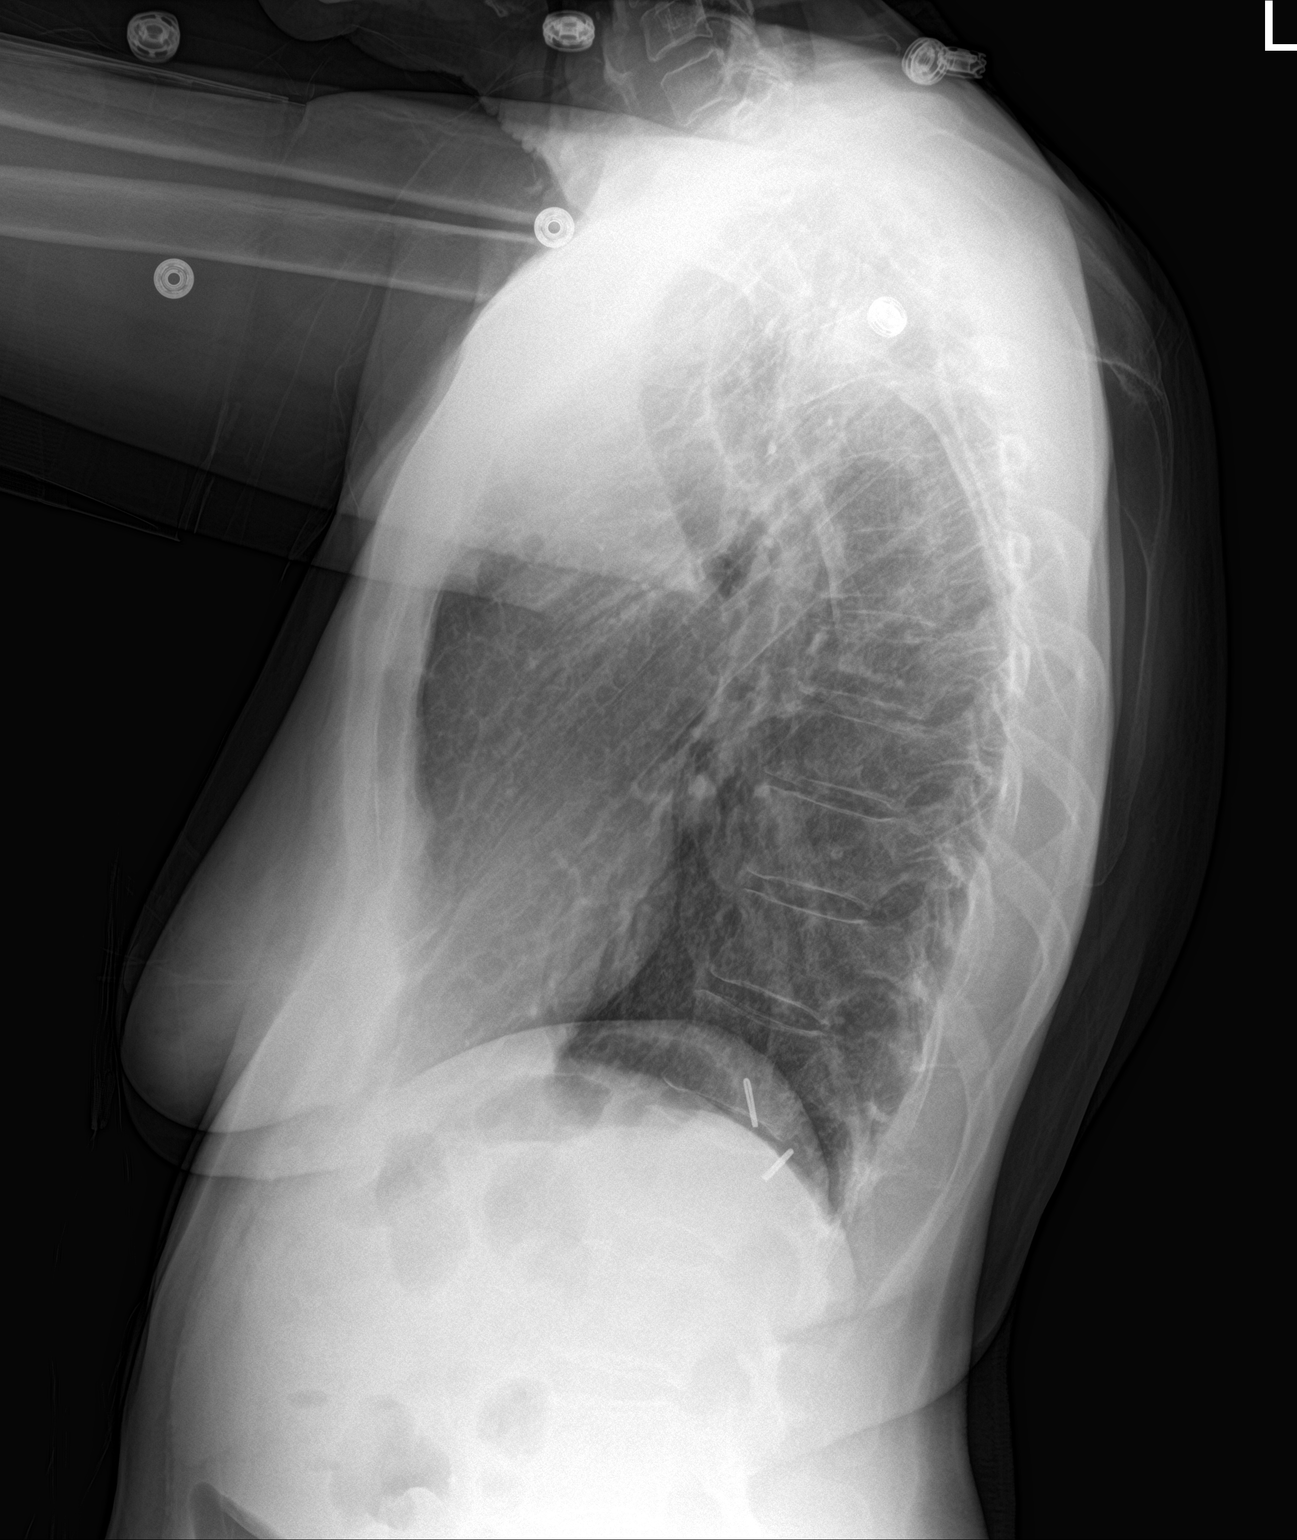

[2 of 2 positions shown; findings below may reference images not displayed]

FINDINGS: No pneumonia or effusion is seen. However, on the frontal view there
is opacity just above the aortic knob with some indistinctness of
the aortic knob. This may all represent scarring but either
comparison with prior or followup chest x-ray versus CT of the chest
is recommended. Otherwise mediastinal and hilar contours are
unremarkable. The heart is within normal limits in size. No acute
bony abnormality is seen. Surgical clips are noted in left upper
quadrant posteriorly.
IMPRESSION: 1. No definite active pneumonia or effusion.
2. Vague opacity above the aortic knob medially may represent
scarring but recommend comparison with prior or followup chest
x-ray.

## 2019-03-20 DIAGNOSIS — F331 Major depressive disorder, recurrent, moderate: Secondary | ICD-10-CM | POA: Diagnosis not present

## 2019-03-20 DIAGNOSIS — Z6825 Body mass index (BMI) 25.0-25.9, adult: Secondary | ICD-10-CM | POA: Diagnosis not present

## 2019-03-20 DIAGNOSIS — Z1231 Encounter for screening mammogram for malignant neoplasm of breast: Secondary | ICD-10-CM | POA: Diagnosis not present

## 2019-03-20 DIAGNOSIS — Z01419 Encounter for gynecological examination (general) (routine) without abnormal findings: Secondary | ICD-10-CM | POA: Diagnosis not present

## 2019-03-20 DIAGNOSIS — F341 Dysthymic disorder: Secondary | ICD-10-CM | POA: Diagnosis not present

## 2019-03-24 DIAGNOSIS — Z13220 Encounter for screening for lipoid disorders: Secondary | ICD-10-CM | POA: Diagnosis not present

## 2019-03-24 DIAGNOSIS — Z1321 Encounter for screening for nutritional disorder: Secondary | ICD-10-CM | POA: Diagnosis not present

## 2019-03-24 DIAGNOSIS — Z1329 Encounter for screening for other suspected endocrine disorder: Secondary | ICD-10-CM | POA: Diagnosis not present

## 2019-03-24 DIAGNOSIS — Z130 Encounter for screening for diseases of the blood and blood-forming organs and certain disorders involving the immune mechanism: Secondary | ICD-10-CM | POA: Diagnosis not present

## 2019-03-24 DIAGNOSIS — Z13228 Encounter for screening for other metabolic disorders: Secondary | ICD-10-CM | POA: Diagnosis not present

## 2019-03-31 DIAGNOSIS — F331 Major depressive disorder, recurrent, moderate: Secondary | ICD-10-CM | POA: Diagnosis not present

## 2019-04-10 DIAGNOSIS — F331 Major depressive disorder, recurrent, moderate: Secondary | ICD-10-CM | POA: Diagnosis not present

## 2019-04-17 DIAGNOSIS — F331 Major depressive disorder, recurrent, moderate: Secondary | ICD-10-CM | POA: Diagnosis not present

## 2019-04-18 DIAGNOSIS — F331 Major depressive disorder, recurrent, moderate: Secondary | ICD-10-CM | POA: Diagnosis not present

## 2019-04-18 DIAGNOSIS — F411 Generalized anxiety disorder: Secondary | ICD-10-CM | POA: Diagnosis not present

## 2019-04-19 DIAGNOSIS — R5381 Other malaise: Secondary | ICD-10-CM | POA: Diagnosis not present

## 2019-04-19 DIAGNOSIS — R519 Headache, unspecified: Secondary | ICD-10-CM | POA: Diagnosis not present

## 2019-04-19 DIAGNOSIS — R5383 Other fatigue: Secondary | ICD-10-CM | POA: Diagnosis not present

## 2019-04-19 DIAGNOSIS — R0981 Nasal congestion: Secondary | ICD-10-CM | POA: Diagnosis not present

## 2019-04-19 DIAGNOSIS — J3489 Other specified disorders of nose and nasal sinuses: Secondary | ICD-10-CM | POA: Diagnosis not present

## 2019-04-19 DIAGNOSIS — Z20828 Contact with and (suspected) exposure to other viral communicable diseases: Secondary | ICD-10-CM | POA: Diagnosis not present

## 2019-05-08 DIAGNOSIS — F341 Dysthymic disorder: Secondary | ICD-10-CM | POA: Diagnosis not present

## 2019-05-08 DIAGNOSIS — F331 Major depressive disorder, recurrent, moderate: Secondary | ICD-10-CM | POA: Diagnosis not present

## 2019-05-16 DIAGNOSIS — F331 Major depressive disorder, recurrent, moderate: Secondary | ICD-10-CM | POA: Diagnosis not present

## 2019-05-22 DIAGNOSIS — F331 Major depressive disorder, recurrent, moderate: Secondary | ICD-10-CM | POA: Diagnosis not present

## 2019-05-22 DIAGNOSIS — F341 Dysthymic disorder: Secondary | ICD-10-CM | POA: Diagnosis not present

## 2019-05-25 ENCOUNTER — Telehealth: Payer: Self-pay | Admitting: Cardiology

## 2019-06-12 DIAGNOSIS — F331 Major depressive disorder, recurrent, moderate: Secondary | ICD-10-CM | POA: Diagnosis not present

## 2019-06-21 DIAGNOSIS — F331 Major depressive disorder, recurrent, moderate: Secondary | ICD-10-CM | POA: Diagnosis not present

## 2019-06-21 DIAGNOSIS — F411 Generalized anxiety disorder: Secondary | ICD-10-CM | POA: Diagnosis not present

## 2019-06-22 DIAGNOSIS — F331 Major depressive disorder, recurrent, moderate: Secondary | ICD-10-CM | POA: Diagnosis not present

## 2019-06-26 DIAGNOSIS — F341 Dysthymic disorder: Secondary | ICD-10-CM | POA: Diagnosis not present

## 2019-06-26 DIAGNOSIS — F331 Major depressive disorder, recurrent, moderate: Secondary | ICD-10-CM | POA: Diagnosis not present

## 2019-06-30 ENCOUNTER — Ambulatory Visit: Payer: BC Managed Care – PPO | Admitting: Cardiology

## 2019-07-03 DIAGNOSIS — F341 Dysthymic disorder: Secondary | ICD-10-CM | POA: Diagnosis not present

## 2019-07-03 DIAGNOSIS — F331 Major depressive disorder, recurrent, moderate: Secondary | ICD-10-CM | POA: Diagnosis not present

## 2019-07-12 ENCOUNTER — Encounter: Payer: Self-pay | Admitting: Cardiology

## 2019-07-12 ENCOUNTER — Other Ambulatory Visit: Payer: Self-pay

## 2019-07-12 ENCOUNTER — Ambulatory Visit (INDEPENDENT_AMBULATORY_CARE_PROVIDER_SITE_OTHER): Payer: BC Managed Care – PPO | Admitting: Cardiology

## 2019-07-12 VITALS — BP 130/84 | HR 85 | Ht 66.0 in | Wt 153.0 lb

## 2019-07-12 DIAGNOSIS — E7800 Pure hypercholesterolemia, unspecified: Secondary | ICD-10-CM

## 2019-07-12 DIAGNOSIS — I341 Nonrheumatic mitral (valve) prolapse: Secondary | ICD-10-CM

## 2019-07-12 DIAGNOSIS — R002 Palpitations: Secondary | ICD-10-CM

## 2019-07-12 DIAGNOSIS — I25118 Atherosclerotic heart disease of native coronary artery with other forms of angina pectoris: Secondary | ICD-10-CM | POA: Diagnosis not present

## 2019-07-12 DIAGNOSIS — R55 Syncope and collapse: Secondary | ICD-10-CM | POA: Diagnosis not present

## 2019-07-12 DIAGNOSIS — I700 Atherosclerosis of aorta: Secondary | ICD-10-CM

## 2019-07-12 DIAGNOSIS — I6522 Occlusion and stenosis of left carotid artery: Secondary | ICD-10-CM | POA: Diagnosis not present

## 2019-07-12 DIAGNOSIS — I447 Left bundle-branch block, unspecified: Secondary | ICD-10-CM | POA: Diagnosis not present

## 2019-07-12 MED ORDER — ROSUVASTATIN CALCIUM 10 MG PO TABS: 10.0000 mg | ORAL_TABLET | Freq: Every day | ORAL | 2 refills | Status: DC

## 2019-07-12 NOTE — Progress Notes (Signed)
Primary Physician/Referring:  Leanna Battles, MD  Patient ID: Cynthia Poole, female    DOB: May 27, 1956, 64 y.o.   MRN: [id number]  Chief Complaint  Patient presents with  . Coronary Artery Disease  . Palpitations  . Dizziness   HPI:    Cynthia Poole  is a 64 y.o. Caucasian female with hyperlipidemia, coronary artery disease status post PCI to the dominant circumflex in 2018, LBBB, history of Hodgkin's lymphoma 1997 status post chemotherapy and radiation therapy to chest presents for annual visit. She made this visit also due to frequent palpations again since yesterday.    She had an appointment to see [place] today, but states that since yesterday symptoms of palpitations have gotten worse. Also states she gets short of breath and also has chest discomfort when she exercises and has to stop and rest frequently. She continues to have severe dizziness and near syncopal spells while trying to exercise and started years ago and now has got worse that she is unable to exercise especially if it is hot weather.   Previously she was on low-dose of beta blocker which she wanted to discontinue stating that she has very low blood pressure and does not want to be on medication. There is no history of diabetes, hypertension, tobacco use disorder.  Past Medical History:  Diagnosis Date  . Anemia   . CAD in native artery 09/29/2016   Coronary angiogram 8/67/67:[id number] LV systolic function, EF 62%. Left dominant circulation. Mild disease in the ramus and LAD. Circumflex midsegment  90% by IVUS S/P stenting 4.0 x 18 mm Onyx DES.  . DDD (degenerative disc disease), cervical   . DDD (degenerative disc disease), lumbar   . Depression   . Fibromyalgia 03/2004  . GERD (gastroesophageal reflux disease)   . Heart murmur   . History of pleurisy   . HLD (hyperlipidemia)   . Hodgkin's disease 1997    S/P Chemo, Chest RT and splenectomy  . Hypertension    pt denies  . Hypothyroidism    no longer per pt   .  Insomnia   . Internal and external bleeding hemorrhoids   . Migraines   . MVP (mitral valve prolapse)   . Osteopenia   . Personal history of colonic adenoma 06/29/2011   06/29/2011 - diminutive adenoma  . Seasonal allergies   . Shingles 2018  . Vitamin D deficiency    Past Surgical History:  Procedure Laterality Date  . BUNIONECTOMY    . CARDIAC CATHETERIZATION  09/29/2016  . COLONOSCOPY  09/02/2007   redundant colon, external hemorrhoids  . COLONOSCOPY  06/29/2011   diminutive polyp, diverticulosis, external hemorrhoids  . CORONARY STENT INTERVENTION N/A 09/29/2016   Procedure: Coronary Stent Intervention;  Surgeon: Adrian Prows, MD;  Location: [facility];  Service: Cardiovascular;  Laterality: N/A;  . DEEP NECK LYMPH NODE BIOPSY / EXCISION  1996   right LN   . ESOPHAGOGASTRODUODENOSCOPY  06/29/2011   notrmal, 54 Fr dilation (dysphagia)  . INTRAVASCULAR ULTRASOUND/IVUS N/A 09/29/2016   Procedure: Intravascular Ultrasound/IVUS;  Surgeon: Adrian Prows, MD;  Location: [facility];  Service: Cardiovascular;  Laterality: N/A;  . LEFT HEART CATH AND CORONARY ANGIOGRAPHY N/A 09/29/2016   Procedure: Left Heart Cath and Coronary Angiography;  Surgeon: Adrian Prows, MD;  Location: [facility];  Service: Cardiovascular;  Laterality: N/A;  . SPLENECTOMY, TOTAL  1996  . STRABISMUS SURGERY  02/2009   left  . TONSILLECTOMY     age  [age]   Social History   Tobacco Use  . Smoking status: Never Smoker  . Smokeless tobacco: Never Used  Substance Use Topics  . Alcohol use: Yes    Alcohol/week: 7.0 standard drinks    Types: 7 Glasses of wine per week    ROS  Review of Systems  Constitution: Negative for weight gain.  Cardiovascular: Positive for palpitations. Negative for dyspnea on exertion, leg swelling and syncope.  Respiratory: Negative for hemoptysis.   Endocrine: Negative for cold intolerance.  Hematologic/Lymphatic: Does not bruise/bleed easily.  Gastrointestinal: Negative for  hematochezia and melena.  Neurological: Positive for dizziness. Negative for headaches and light-headedness.   Objective  Blood pressure 130/84, pulse 85, height '5\' 6"'  (1.676 m), weight 153 lb (69.4 kg), SpO2 96 %.  Vitals with BMI 07/12/2019 07/12/2019 03/13/2019  Height - '5\' 6"'  '5\' 6"'   Weight - 153 lbs 155 lbs 3 oz  BMI - 42.35 36.14  Systolic 431 540 086  Diastolic 84 84 80  Pulse - 85 79    Orthostatic VS for the past 24 hrs (Last 3 readings):  BP- Lying BP- Sitting BP- Standing at 0 minutes BP- Standing at 3 minutes  07/12/19 1335 144/84 144/84 130/84 140/80     Physical Exam  Neck: No thyromegaly present.  Cardiovascular: Normal rate, regular rhythm, intact distal pulses and normal pulses. Exam reveals a midsystolic click. Exam reveals no gallop.  Murmur heard.  Crescendo-decrescendo mid to late systolic murmur is present with a grade of 2/6 at the apex. Pulses:      Carotid pulses are on the right side with bruit. No leg edema, no JVD.  Pulmonary/Chest: Effort normal and breath sounds normal.  Abdominal: Soft. Bowel sounds are normal.  Musculoskeletal:     Cervical back: Neck supple.  Skin: Skin is warm and dry.   Laboratory examination:   No results for input(s): NA, K, CL, CO2, GLUCOSE, BUN, CREATININE, CALCIUM, GFRNONAA, GFRAA in the last 8760 hours. CrCl cannot be calculated (Patient's most recent lab result is older than the maximum 21 days allowed.).  CMP Latest Ref Rng & Units 09/30/2016 09/29/2016 06/15/2011  Glucose 65 - 99 mg/dL 109(H) 168(H) 84  BUN 6 - 20 mg/dL '11 9 8  ' Creatinine 0.44 - 1.00 mg/dL 0.84 0.85 0.73  Sodium 135 - 145 mmol/L 136 135 137  Potassium 3.5 - 5.1 mmol/L 5.4(H) 3.9 4.4  Chloride 101 - 111 mmol/L 103 101 109  CO2 22 - 32 mmol/L '26 24 22  ' Calcium 8.9 - 10.3 mg/dL 9.5 9.2 7.3(L)  Total Protein 6.0 - 8.3 g/dL - - 5.0(L)  Total Bilirubin 0.3 - 1.2 mg/dL - - 0.2(L)  Alkaline Phos 39 - 117 U/L - - 27(L)  AST 0 - 37 U/L - - 30  ALT 0 - 35 U/L  - - 16   CBC Latest Ref Rng & Units 09/30/2016 09/29/2016 06/15/2011  WBC 4.0 - 10.5 K/uL 8.2 6.1 5.4  Hemoglobin 12.0 - 15.0 g/dL 11.4(L) 12.3 10.7(L)  Hematocrit 36.0 - 46.0 % 33.8(L) 36.7 31.8(L)  Platelets 150 - 400 K/uL 284 287 266   Lipid Panel     Component Value Date/Time   CHOL 225 (H) 09/29/2016 1505   TRIG 88 09/29/2016 1505   HDL 71 09/29/2016 1505   CHOLHDL 3.2 09/29/2016 1505   VLDL 18 09/29/2016 1505   LDLCALC 136 (H) 09/29/2016 1505   HEMOGLOBIN A1C Lab Results  Component Value Date   HGBA1C 5.5 09/30/2016  MPG 111 09/30/2016   TSH No results for input(s): TSH in the last 8760 hours.  Labs 03/24/2019:   Cholesterol, total 278.000 M 03/24/2019  HDL 82.000 M 03/24/2019 LDL 78.000 mg 03/16/2018 Triglycerides 81.000 M 03/24/2019, non-HDL 196. A1C 5.600 % of 9/25/201 labs   Hemoglobin 12.500 G/ 03/24/2019 Platelets 344.000 X 03/24/2019 Creatinine, Serum 0.910 MG/ 03/24/2019 Potassium 4.700 mm 03/16/2018 ALT (SGPT) 23.000 IU/ 03/24/2019  TSH 5.660 03/24/2019    Medications and allergies   Allergies  Allergen Reactions  . Other     OPIATES & CODEINE CAUSE GI UPSET  . Zofran     zofran causes drop in blood pressure     Current Outpatient Medications  Medication Instructions  . ALPRAZolam (XANAX) 1 MG tablet 1 tablet, Oral, Daily at bedtime  . aspirin 81 mg, Oral, Daily  . buPROPion (WELLBUTRIN XL) 150 mg, Oral, Daily  . calcium carbonate (CALCIUM 600) 600 mg, Oral, Daily  . MAGNESIUM CITRATE PO Oral, Daily  . meloxicam (MOBIC) 15 MG tablet Take one pill a day with food for 5 days and then prn thereafter  . nitroGLYCERIN (NITROSTAT) 0.4 mg, Sublingual, Every 5 min x3 PRN  . Omega-3 1400 MG CAPS Oral, Daily  . Rexulti 1 mg, Oral, Daily  . rosuvastatin (CRESTOR) 10 mg, Oral, Daily  . TURMERIC PO 80 mg, Oral, Daily  . VITAMIN D PO Oral, Daily  . Vitamin D 2,500 Units, Oral, Daily    Radiology:  Aortic atherosclerosis (I70.0)  CT of the chest without  contrast 11/10/2016 [facility]):  Mild to moderate coronary artery calcification, normal thoracic aorta, mild calcification of the aortic valve and aortic root. Fibrotic changes in both upper lobes likely related to prior radiation therapy.   [facility] 11/10/2016: Negative for PE.  Cardiac Studies:   Coronary angiogram 6/73/41:[id number] LV systolic function, EF 09%. Left dominant circulation. Mild disease in the ramus and LAD. Circumflex midsegment 90% stenosis by intravascular ultrasound. Successful stenting with 4.0 x 18 mm Onyx DES.  Treadmill exercise stress test 10/23/2016: Indication: CP and SoB Resting EKG demonstrates NSR. The patient exercised according to Bruce Protocol, Total time recorded 7:59 min achieving max heart rate of 136 which was 85% of THR for age and 10.11 METS of work.  Stress terminated due to dyspnea, lightheadedness and THR (>85% MPHR)/MPHR met. Normal BP response.  Patient developed LBBB with peak exercise which reversed to normal at 2 minutes into recovery. Post recovery of LBBB EKG shows 1 mm ST depression with T inversion in high lateral and lateral leads. Normal BP response. Rec: Equivocal stress test for ischemia with development of exercise induced LBBB. Clinical correlation recommended.  Event Monitor 30 days 06/18/2017: Symptomatic transmissions of fatigue, shortness of breath, palpitations and chest pain revealed normal sinus rhythm. Minimum heart rate was 62 bpm, maximum heart rate was 132 bpm. Paroxysmal episodes of left bundle branch block with tachycardia. No heart block, no atrial fibrillation. Rare PVC/PAC.  Carotid artery duplex  12/27/2018: No significant stenosis in the right carotid artery. Stenosis in the left internal carotid artery (16-49%). Antegrade right vertebral artery flow. Antegrade left vertebral artery flow. Compared to 02/01/2018, left carotid stenosis severity was previously > 50%. Follow up in one year is  appropriate if clinically indicated.  Assessment     ICD-10-CM   1. Coronary artery disease of native artery of native heart with stable angina pectoris (HCC)  I25.118 EKG 12-Lead    PCV ECHOCARDIOGRAM COMPLETE  PCV MYOCARDIAL PERFUSION WITH LEXISCAN  2. LBBB (left bundle branch block)  I44.7 CARDIAC EVENT MONITOR  3. Asymptomatic stenosis of left carotid artery without infarction  I65.22   4. Mitral valve prolapse  I34.1   5. Near syncope  R55 CARDIAC EVENT MONITOR  6. Palpitations  R00.2 CARDIAC EVENT MONITOR  7. Hypercholesteremia  E78.00 rosuvastatin (CRESTOR) 10 MG tablet    EKG 07/12/2019: Normal sinus rhythm at rate of 78 bpm, left bundle branch block.  No further analysis. No significant change from EKG 01/20/2018.  Meds ordered this encounter  Medications  . rosuvastatin (CRESTOR) 10 MG tablet    Sig: Take 1 tablet (10 mg total) by mouth daily.    Dispense:  30 tablet    Refill:  2    Medications Discontinued During This Encounter  Medication Reason  . amitriptyline (ELAVIL) 25 MG tablet Completed Course  . propranolol (INDERAL) 10 MG tablet Change in therapy  . psyllium (METAMUCIL) 58.6 % packet Error  . venlafaxine XR (EFFEXOR-XR) 150 MG 24 hr capsule Error     Recommendations:   Cynthia Poole  is a 64 y.o. Caucasian female with hyperlipidemia, coronary artery disease status post PCI to the dominant circumflex in 2018, LBBB, history of Hodgkin's lymphoma 1997 status post chemotherapy and radiation therapy to chest presents for annual visit.  She has been having worsening symptoms of dizziness lately although it has been a chronic problem.  She has had near syncopal spells, especially with exertional activity and has also had episodes of chest tightness and palpitations with exertion activity suggestive of angina pectoris.  She was mildly orthostatic immediately upon standing however blood pressure stabilized after 3 minutes.  Advised her to use Pedialyte prior to her  exercises to keep her self well-hydrated.  Although blood pressure was elevated today In view of dizziness I did not any medications and also want to do cardiac evaluation first to evaluate the etiology prior to starting medications.  I had a long discussion with the patient regarding aortic atherosclerosis, coronary atherosclerosis, noted on CT scan and also known coronary disease and also carotid disease and hence need for statins.  She is reluctant to taking medications, will start her on low-dose Crestor 10 mg daily. Continue carotid surveillance.   I will set up for a Event/Holter monitor for 3  days. Explained how to use it and to activate the device. Will schedule for an echocardiogram especially to evaluate mitral regurgitation and possible MVP. Schedule for a Lexiscan Sestamibi stress test to evaluate for myocardial ischemia. Patient unable to do treadmill stress testing due to Covid-19 to avoid aerosol spread.  Office visit following the work-up/investigations.  This was a 40 minute encounter evaluating complex medical issues and review of external labs.   Adrian Prows, MD, [facility] 07/12/2019, 1:55 PM [facility]. [place] Office: [phone]

## 2019-07-13 ENCOUNTER — Ambulatory Visit: Payer: BC Managed Care – PPO | Admitting: Cardiology

## 2019-07-14 DIAGNOSIS — F331 Major depressive disorder, recurrent, moderate: Secondary | ICD-10-CM | POA: Diagnosis not present

## 2019-07-17 ENCOUNTER — Other Ambulatory Visit: Payer: Self-pay

## 2019-07-17 ENCOUNTER — Ambulatory Visit (INDEPENDENT_AMBULATORY_CARE_PROVIDER_SITE_OTHER): Payer: BC Managed Care – PPO

## 2019-07-17 DIAGNOSIS — I25118 Atherosclerotic heart disease of native coronary artery with other forms of angina pectoris: Secondary | ICD-10-CM | POA: Diagnosis not present

## 2019-07-17 DIAGNOSIS — R0789 Other chest pain: Secondary | ICD-10-CM

## 2019-07-17 DIAGNOSIS — R0602 Shortness of breath: Secondary | ICD-10-CM | POA: Diagnosis not present

## 2019-07-17 DIAGNOSIS — F331 Major depressive disorder, recurrent, moderate: Secondary | ICD-10-CM | POA: Diagnosis not present

## 2019-07-18 NOTE — Progress Notes (Signed)
Pt aware.

## 2019-07-18 NOTE — Progress Notes (Signed)
Low risk study, does not appear to show any significant coronary ischemia. Mild perfusion abnormality due to underlying left bundle branch block on EKG that is old

## 2019-07-19 ENCOUNTER — Telehealth: Payer: Self-pay

## 2019-07-19 DIAGNOSIS — F331 Major depressive disorder, recurrent, moderate: Secondary | ICD-10-CM | POA: Diagnosis not present

## 2019-07-19 DIAGNOSIS — F411 Generalized anxiety disorder: Secondary | ICD-10-CM | POA: Diagnosis not present

## 2019-07-19 NOTE — Telephone Encounter (Signed)
Yes indeed, she was on it previously and did well

## 2019-07-19 NOTE — Telephone Encounter (Signed)
Psychiatrist nurse Cynthia Poole calling they are wanting to prescribe Propranolol 20mg   Daily as needed for anxiety. Is it ok for them to do this with the heart conditions she has. [phone] ext [phone]

## 2019-07-20 NOTE — Telephone Encounter (Signed)
Spoke with [facility] concerning propranolol.

## 2019-07-21 ENCOUNTER — Ambulatory Visit (INDEPENDENT_AMBULATORY_CARE_PROVIDER_SITE_OTHER): Payer: BC Managed Care – PPO

## 2019-07-21 ENCOUNTER — Other Ambulatory Visit: Payer: Self-pay

## 2019-07-21 ENCOUNTER — Ambulatory Visit: Payer: BC Managed Care – PPO

## 2019-07-21 DIAGNOSIS — I25118 Atherosclerotic heart disease of native coronary artery with other forms of angina pectoris: Secondary | ICD-10-CM | POA: Diagnosis not present

## 2019-07-21 DIAGNOSIS — R55 Syncope and collapse: Secondary | ICD-10-CM

## 2019-07-21 DIAGNOSIS — I447 Left bundle-branch block, unspecified: Secondary | ICD-10-CM

## 2019-07-21 DIAGNOSIS — R002 Palpitations: Secondary | ICD-10-CM

## 2019-07-24 ENCOUNTER — Ambulatory Visit: Payer: BC Managed Care – PPO | Admitting: Cardiology

## 2019-07-24 DIAGNOSIS — F331 Major depressive disorder, recurrent, moderate: Secondary | ICD-10-CM | POA: Diagnosis not present

## 2019-07-25 NOTE — Progress Notes (Signed)
Normal LV systolic function. Mild diastolic dysfunction.  Diastolic function means the heart has to relax to receive the blood so it can pump the blood out. If relaxation is impaired, then the blood coming to the heart is restricted and can cause dyspnea and reduced functional capacity and fluid build up. Exercise, weight loss, control of BP, salt restriction will improve this.

## 2019-07-27 ENCOUNTER — Telehealth: Payer: Self-pay

## 2019-07-27 NOTE — Telephone Encounter (Signed)
Spoke with patient gave results and transferred call to [place] for scheduling f/u appt. Patient verbalized understanding

## 2019-07-27 NOTE — Telephone Encounter (Signed)
-----   Message from Adrian Prows, MD sent at 07/25/2019  2:13 PM EST ----- Normal LV systolic function. Mild diastolic dysfunction.  Diastolic function means the heart has to relax to receive the blood so it can pump the blood out. If relaxation is impaired, then the blood coming to the heart is restricted and can cause dyspnea and reduced functional capacity and fluid build up. Exercise, weight loss, control of BP, salt restriction will improve this.

## 2019-07-31 DIAGNOSIS — F331 Major depressive disorder, recurrent, moderate: Secondary | ICD-10-CM | POA: Diagnosis not present

## 2019-07-31 DIAGNOSIS — F341 Dysthymic disorder: Secondary | ICD-10-CM | POA: Diagnosis not present

## 2019-08-02 ENCOUNTER — Telehealth: Payer: Self-pay | Admitting: Cardiology

## 2019-08-02 ENCOUNTER — Ambulatory Visit: Payer: BC Managed Care – PPO | Admitting: Cardiology

## 2019-08-02 NOTE — Progress Notes (Deleted)
Primary Physician/Referring:  Leanna Battles, MD  Patient ID: Cynthia Poole, female    DOB: 1956/01/06, 64 y.o.   MRN: [id number]  No chief complaint on file.  HPI:    Cynthia Poole  is a 64 y.o. Caucasian female with hyperlipidemia, coronary artery disease status post PCI to the dominant circumflex in 2018, LBBB, history of Hodgkin's lymphoma 1997 status post chemotherapy and radiation therapy to chest presents for annual visit. She made this visit also due to frequent palpations again since yesterday.    She had an appointment to see [place] today, but states that since yesterday symptoms of palpitations have gotten worse. Also states she gets short of breath and also has chest discomfort when she exercises and has to stop and rest frequently. She continues to have severe dizziness and near syncopal spells while trying to exercise and started years ago and now has got worse that she is unable to exercise especially if it is hot weather.   Previously she was on low-dose of beta blocker which she wanted to discontinue stating that she has very low blood pressure and does not want to be on medication. There is no history of diabetes, hypertension, tobacco use disorder.  Past Medical History:  Diagnosis Date  . Anemia   . CAD in native artery 09/29/2016   Coronary angiogram 0000000 LV systolic function, EF 123456. Left dominant circulation. Mild disease in the ramus and LAD. Circumflex midsegment  90% by IVUS S/P stenting 4.0 x 18 mm Onyx DES.  . DDD (degenerative disc disease), cervical   . DDD (degenerative disc disease), lumbar   . Depression   . Fibromyalgia 03/2004  . GERD (gastroesophageal reflux disease)   . Heart murmur   . History of pleurisy   . HLD (hyperlipidemia)   . Hodgkin's disease 1997    S/P Chemo, Chest RT and splenectomy  . Hypertension    pt denies  . Hypothyroidism    no longer per pt   . Insomnia   . Internal and external bleeding hemorrhoids   . Migraines     . MVP (mitral valve prolapse)   . Osteopenia   . Personal history of colonic adenoma 06/29/2011   06/29/2011 - diminutive adenoma  . Seasonal allergies   . Shingles 2018  . Vitamin D deficiency    Past Surgical History:  Procedure Laterality Date  . BUNIONECTOMY    . CARDIAC CATHETERIZATION  09/29/2016  . COLONOSCOPY  09/02/2007   redundant colon, external hemorrhoids  . COLONOSCOPY  06/29/2011   diminutive polyp, diverticulosis, external hemorrhoids  . CORONARY STENT INTERVENTION N/A 09/29/2016   Procedure: Coronary Stent Intervention;  Surgeon: Adrian Prows, MD;  Location: [facility];  Service: Cardiovascular;  Laterality: N/A;  . DEEP NECK LYMPH NODE BIOPSY / EXCISION  1996   right LN   . ESOPHAGOGASTRODUODENOSCOPY  06/29/2011   notrmal, 54 Fr dilation (dysphagia)  . INTRAVASCULAR ULTRASOUND/IVUS N/A 09/29/2016   Procedure: Intravascular Ultrasound/IVUS;  Surgeon: Adrian Prows, MD;  Location: [facility];  Service: Cardiovascular;  Laterality: N/A;  . LEFT HEART CATH AND CORONARY ANGIOGRAPHY N/A 09/29/2016   Procedure: Left Heart Cath and Coronary Angiography;  Surgeon: Adrian Prows, MD;  Location: [facility];  Service: Cardiovascular;  Laterality: N/A;  . SPLENECTOMY, TOTAL  1996  . STRABISMUS SURGERY  02/2009   left  . TONSILLECTOMY     age 3-18   Social History   Tobacco Use  . Smoking  status: Never Smoker  . Smokeless tobacco: Never Used  Substance Use Topics  . Alcohol use: Yes    Alcohol/week: 7.0 standard drinks    Types: 7 Glasses of wine per week    ROS  Review of Systems  Constitution: Negative for weight gain.  Cardiovascular: Positive for palpitations. Negative for dyspnea on exertion, leg swelling and syncope.  Respiratory: Negative for hemoptysis.   Endocrine: Negative for cold intolerance.  Hematologic/Lymphatic: Does not bruise/bleed easily.  Gastrointestinal: Negative for hematochezia and melena.  Neurological: Positive for dizziness.  Negative for headaches and light-headedness.   Objective  There were no vitals taken for this visit.  Vitals with BMI 07/12/2019 07/12/2019 03/13/2019  Height - 5\' 6"  5\' 6"   Weight - 153 lbs 155 lbs 3 oz  BMI - 0000000 123456  Systolic [id number]  Diastolic 84 84 80  Pulse - 85 79    No data found.   Physical Exam  Neck: No thyromegaly present.  Cardiovascular: Normal rate, regular rhythm, intact distal pulses and normal pulses. Exam reveals a midsystolic click. Exam reveals no gallop.  Murmur heard.  Crescendo-decrescendo mid to late systolic murmur is present with a grade of 2/6 at the apex. Pulses:      Carotid pulses are on the right side with bruit. No leg edema, no JVD.  Pulmonary/Chest: Effort normal and breath sounds normal.  Abdominal: Soft. Bowel sounds are normal.  Musculoskeletal:     Cervical back: Neck supple.  Skin: Skin is warm and dry.   Laboratory examination:   No results for input(s): NA, K, CL, CO2, GLUCOSE, BUN, CREATININE, CALCIUM, GFRNONAA, GFRAA in the last 8760 hours. CrCl cannot be calculated (Patient's most recent lab result is older than the maximum 21 days allowed.).  CMP Latest Ref Rng & Units 09/30/2016 09/29/2016 06/15/2011  Glucose 65 - 99 mg/dL 109(H) 168(H) 84  BUN 6 - 20 mg/dL 11 9 8   Creatinine 0.44 - 1.00 mg/dL 0.84 0.85 0.73  Sodium 135 - 145 mmol/L 136 135 137  Potassium 3.5 - 5.1 mmol/L 5.4(H) 3.9 4.4  Chloride 101 - 111 mmol/L 103 101 109  CO2 22 - 32 mmol/L 26 24 22   Calcium 8.9 - 10.3 mg/dL 9.5 9.2 7.3(L)  Total Protein 6.0 - 8.3 g/dL - - 5.0(L)  Total Bilirubin 0.3 - 1.2 mg/dL - - 0.2(L)  Alkaline Phos 39 - 117 U/L - - 27(L)  AST 0 - 37 U/L - - 30  ALT 0 - 35 U/L - - 16   CBC Latest Ref Rng & Units 09/30/2016 09/29/2016 06/15/2011  WBC 4.0 - 10.5 K/uL 8.2 6.1 5.4  Hemoglobin 12.0 - 15.0 g/dL 11.4(L) 12.3 10.7(L)  Hematocrit 36.0 - 46.0 % 33.8(L) 36.7 31.8(L)  Platelets 150 - 400 K/uL 284 287 266   Lipid Panel     Component  Value Date/Time   CHOL 225 (H) 09/29/2016 1505   TRIG 88 09/29/2016 1505   HDL 71 09/29/2016 1505   CHOLHDL 3.2 09/29/2016 1505   VLDL 18 09/29/2016 1505   LDLCALC 136 (H) 09/29/2016 1505   HEMOGLOBIN A1C Lab Results  Component Value Date   HGBA1C 5.5 09/30/2016   MPG 111 09/30/2016   TSH No results for input(s): TSH in the last 8760 hours.  Labs 03/24/2019:   Cholesterol, total 278.000 M 03/24/2019  HDL 82.000 M 03/24/2019 LDL 78.000 mg 03/16/2018 Triglycerides 81.000 M 03/24/2019, non-HDL 196. A1C 5.600 % of 9/25/201 labs   Hemoglobin 12.500 G/  03/24/2019 Platelets 344.000 X 03/24/2019 Creatinine, Serum 0.910 MG/ 03/24/2019 Potassium 4.700 mm 03/16/2018 ALT (SGPT) 23.000 IU/ 03/24/2019  TSH 5.660 03/24/2019    Medications and allergies   Allergies  Allergen Reactions  . Other     OPIATES & CODEINE CAUSE GI UPSET  . Zofran     zofran causes drop in blood pressure     Current Outpatient Medications  Medication Instructions  . ALPRAZolam (XANAX) 1 MG tablet 1 tablet, Oral, Daily at bedtime  . aspirin 81 mg, Oral, Daily  . buPROPion (WELLBUTRIN XL) 150 mg, Oral, Daily  . calcium carbonate (CALCIUM 600) 600 mg, Oral, Daily  . MAGNESIUM CITRATE PO Oral, Daily  . meloxicam (MOBIC) 15 MG tablet Take one pill a day with food for 5 days and then prn thereafter  . nitroGLYCERIN (NITROSTAT) 0.4 mg, Sublingual, Every 5 min x3 PRN  . Omega-3 1400 MG CAPS Oral, Daily  . Rexulti 1 mg, Oral, Daily  . rosuvastatin (CRESTOR) 10 mg, Oral, Daily  . TURMERIC PO 80 mg, Oral, Daily  . VITAMIN D PO Oral, Daily  . Vitamin D 2,500 Units, Oral, Daily    Radiology:  Aortic atherosclerosis (I70.0)  CT of the chest without contrast 11/10/2016 [facility]):  Mild to moderate coronary artery calcification, normal thoracic aorta, mild calcification of the aortic valve and aortic root. Fibrotic changes in both upper lobes likely related to prior radiation therapy.   VQ Scan at [facility]  11/10/2016: Negative for PE.  Cardiac Studies:   Coronary angiogram 0000000 LV systolic function, EF 123456. Left dominant circulation. Mild disease in the ramus and LAD. Circumflex midsegment 90% stenosis by intravascular ultrasound. Successful stenting with 4.0 x 18 mm Onyx DES.  Event Monitor 30 days 06/18/2017: Symptomatic transmissions of fatigue, shortness of breath, palpitations and chest pain revealed normal sinus rhythm. Minimum heart rate was 62 bpm, maximum heart rate was 132 bpm. Paroxysmal episodes of left bundle branch block with tachycardia. No heart block, no atrial fibrillation. Rare PVC/PAC.  Carotid artery duplex  12/27/2018: No significant stenosis in the right carotid artery. Stenosis in the left internal carotid artery (16-49%). Antegrade right vertebral artery flow. Antegrade left vertebral artery flow. Compared to 02/01/2018, left carotid stenosis severity was previously > 50%. Follow up in one year is appropriate if clinically indicated.  Lexiscan (Walking with Jaci Carrel) Sestamibi Stress Test 07/17/2019: Non-diagnostic ECG stress. Resting EKG/ECG demonstrated normal sinus rhythm with left bundle branch block. Peak EKG/ECG revealed no significant ST-T change from baseline abnormality. There is a fixed mild defect in the septal and apical regions. This defects can be present in LBBB. Also there is mild soft tissue attenuation in this region. Stress LV EF: 58%.  No wall motion abnormality.  Low risk study. No previous exam available for comparison.  Echocardiogram 07/21/2019:  Left ventricle cavity is normal in size. Moderate concentric hypertrophy  of the left ventricle. Normal LV systolic function with EF 66%. Normal  global wall motion. Indeterminate diastolic filling pattern. Trileaflet  aortic valve. Mild (Grade I) aortic regurgitation.  Moderate (Grade II) mitral regurgitation.  Mild tricuspid regurgitation. Estimated pulmonary artery systolic pressure  is  22 mmHg.  No significant change compared to previous study in 2018.   Assessment   No diagnosis found.  EKG 07/12/2019: Normal sinus rhythm at rate of 78 bpm, left bundle branch block.  No further analysis. No significant change from EKG 01/20/2018.  No orders of the defined types were placed in this encounter.  There are no discontinued medications.   Recommendations:   Cynthia Poole  is a 64 y.o. Caucasian female with hyperlipidemia, coronary artery disease status post PCI to the dominant circumflex in 2018, LBBB, history of Hodgkin's lymphoma 1997 status post chemotherapy and radiation therapy to chest presents for annual visit.  She has been having worsening symptoms of dizziness lately although it has been a chronic problem.  She has had near syncopal spells, especially with exertional activity and has also had episodes of chest tightness and palpitations with exertion activity suggestive of angina pectoris.  She was mildly orthostatic immediately upon standing however blood pressure stabilized after 3 minutes.  Advised her to use Pedialyte prior to her exercises to keep her self well-hydrated.  Although blood pressure was elevated today In view of dizziness I did not any medications and also want to do cardiac evaluation first to evaluate the etiology prior to starting medications.  I had a long discussion with the patient regarding aortic atherosclerosis, coronary atherosclerosis, noted on CT scan and also known coronary disease and also carotid disease and hence need for statins.  She is reluctant to taking medications, will start her on low-dose Crestor 10 mg daily. Continue carotid surveillance.   I will set up for a Event/Holter monitor for 3  days. Explained how to use it and to activate the device. Will schedule for an echocardiogram especially to evaluate mitral regurgitation and possible MVP. Schedule for a Lexiscan Sestamibi stress test to evaluate for myocardial ischemia.  Patient unable to do treadmill stress testing due to Covid-19 to avoid aerosol spread.  Office visit following the work-up/investigations.  This was a 40 minute encounter evaluating complex medical issues and review of external labs.   Miquel Dunn, MD, [facility] 08/02/2019, 10:32 AM [facility]. [employer] Office: [phone]

## 2019-08-02 NOTE — Telephone Encounter (Signed)
I have discussed her echo and stress test results with the patient. Except for diastolic dysfunction, do not see any findings to explain her symptoms. I have explained diastolic dysfunction and ways to help with this. She is currently wearing event monitor, I have recommended that we reschedule her follow up for after this is complete to further discuss.

## 2019-08-07 DIAGNOSIS — C44612 Basal cell carcinoma of skin of right upper limb, including shoulder: Secondary | ICD-10-CM | POA: Diagnosis not present

## 2019-08-07 DIAGNOSIS — Z85828 Personal history of other malignant neoplasm of skin: Secondary | ICD-10-CM | POA: Diagnosis not present

## 2019-08-07 DIAGNOSIS — D2272 Melanocytic nevi of left lower limb, including hip: Secondary | ICD-10-CM | POA: Diagnosis not present

## 2019-08-07 DIAGNOSIS — L218 Other seborrheic dermatitis: Secondary | ICD-10-CM | POA: Diagnosis not present

## 2019-08-07 DIAGNOSIS — D2262 Melanocytic nevi of left upper limb, including shoulder: Secondary | ICD-10-CM | POA: Diagnosis not present

## 2019-08-07 DIAGNOSIS — F341 Dysthymic disorder: Secondary | ICD-10-CM | POA: Diagnosis not present

## 2019-08-07 DIAGNOSIS — F331 Major depressive disorder, recurrent, moderate: Secondary | ICD-10-CM | POA: Diagnosis not present

## 2019-08-09 DIAGNOSIS — H04123 Dry eye syndrome of bilateral lacrimal glands: Secondary | ICD-10-CM | POA: Diagnosis not present

## 2019-08-14 DIAGNOSIS — F341 Dysthymic disorder: Secondary | ICD-10-CM | POA: Diagnosis not present

## 2019-08-14 DIAGNOSIS — F331 Major depressive disorder, recurrent, moderate: Secondary | ICD-10-CM | POA: Diagnosis not present

## 2019-08-21 DIAGNOSIS — F331 Major depressive disorder, recurrent, moderate: Secondary | ICD-10-CM | POA: Diagnosis not present

## 2019-08-21 DIAGNOSIS — F341 Dysthymic disorder: Secondary | ICD-10-CM | POA: Diagnosis not present

## 2019-08-24 DIAGNOSIS — H16223 Keratoconjunctivitis sicca, not specified as Sjogren's, bilateral: Secondary | ICD-10-CM | POA: Diagnosis not present

## 2019-08-28 ENCOUNTER — Ambulatory Visit: Payer: BC Managed Care – PPO | Admitting: Cardiology

## 2019-08-28 ENCOUNTER — Encounter: Payer: Self-pay | Admitting: Cardiology

## 2019-08-28 ENCOUNTER — Other Ambulatory Visit: Payer: Self-pay

## 2019-08-28 VITALS — BP 129/66 | HR 60 | Temp 98.0°F | Resp 16 | Ht 66.0 in | Wt 151.8 lb

## 2019-08-28 DIAGNOSIS — R002 Palpitations: Secondary | ICD-10-CM | POA: Diagnosis not present

## 2019-08-28 DIAGNOSIS — R0609 Other forms of dyspnea: Secondary | ICD-10-CM

## 2019-08-28 DIAGNOSIS — I6522 Occlusion and stenosis of left carotid artery: Secondary | ICD-10-CM

## 2019-08-28 DIAGNOSIS — R0600 Dyspnea, unspecified: Secondary | ICD-10-CM

## 2019-08-28 DIAGNOSIS — E7800 Pure hypercholesterolemia, unspecified: Secondary | ICD-10-CM

## 2019-08-28 DIAGNOSIS — R55 Syncope and collapse: Secondary | ICD-10-CM | POA: Diagnosis not present

## 2019-08-28 DIAGNOSIS — I25118 Atherosclerotic heart disease of native coronary artery with other forms of angina pectoris: Secondary | ICD-10-CM | POA: Diagnosis not present

## 2019-08-28 DIAGNOSIS — E559 Vitamin D deficiency, unspecified: Secondary | ICD-10-CM

## 2019-08-28 MED ORDER — EZETIMIBE 10 MG PO TABS: 10.0000 mg | ORAL_TABLET | Freq: Every day | ORAL | 6 refills | Status: DC

## 2019-08-28 MED ORDER — PRAVASTATIN SODIUM 20 MG PO TABS: 20.0000 mg | ORAL_TABLET | Freq: Every evening | ORAL | 3 refills | Status: DC

## 2019-08-28 MED ORDER — ROSUVASTATIN CALCIUM 10 MG PO TABS: 10.0000 mg | ORAL_TABLET | ORAL | 1 refills | Status: DC

## 2019-08-28 NOTE — Patient Instructions (Signed)
Labs in first week in June 2021.  Ref to Lung Doctor, Dr. Marshell Garfinkel for shortness of breath and you will hear from them .

## 2019-08-28 NOTE — Addendum Note (Signed)
Addended by: Kela Millin on: 08/28/2019 09:00 PM   Modules accepted: Orders

## 2019-08-28 NOTE — Progress Notes (Addendum)
Primary Physician/Referring:  Leanna Battles, MD  Patient ID: Cynthia Poole, female    DOB: August 15, 1955, 64 y.o.   MRN: [id number]  Chief Complaint  Patient presents with  . Coronary Artery Disease  . Results    Monitor, Echocardiogram, stress   HPI:    Cynthia Poole  is a 64 y.o. Caucasian female with hyperlipidemia, coronary artery disease status post PCI to the dominant circumflex in 2018, LBBB, history of Hodgkin's lymphoma 1997 status post chemotherapy and radiation therapy to chest presents for annual visit. She made this visit also due to frequent palpations again since yesterday.    She had an appointment to see [place] today, but states that since yesterday symptoms of palpitations have gotten worse. Also states she gets short of breath and also has chest discomfort when she exercises and has to stop and rest frequently. She continues to have severe dizziness and near syncopal spells while trying to exercise and started years ago and now has got worse that she is unable to exercise especially if it is hot weather.   Previously she was on low-dose of beta blocker which she wanted to discontinue stating that she has very low blood pressure and does not want to be on medication. There is no history of diabetes, hypertension, tobacco use disorder.  Past Medical History:  Diagnosis Date  . Anemia   . CAD in native artery 09/29/2016   Coronary angiogram 0/71/21:FXJOITG LV systolic function, EF 54%. Left dominant circulation. Mild disease in the ramus and LAD. Circumflex midsegment  90% by IVUS S/P stenting 4.0 x 18 mm Onyx DES.  . DDD (degenerative disc disease), cervical   . DDD (degenerative disc disease), lumbar   . Depression   . Fibromyalgia 03/2004  . GERD (gastroesophageal reflux disease)   . Heart murmur   . History of pleurisy   . HLD (hyperlipidemia)   . Hodgkin's disease 1997    S/P Chemo, Chest RT and splenectomy  . Hypertension    pt denies  . Hypothyroidism    no  longer per pt   . Insomnia   . Internal and external bleeding hemorrhoids   . Migraines   . MVP (mitral valve prolapse)   . Osteopenia   . Personal history of colonic adenoma 06/29/2011   06/29/2011 - diminutive adenoma  . Seasonal allergies   . Shingles 2018  . Vitamin D deficiency    Past Surgical History:  Procedure Laterality Date  . BUNIONECTOMY    . CARDIAC CATHETERIZATION  09/29/2016  . COLONOSCOPY  09/02/2007   redundant colon, external hemorrhoids  . COLONOSCOPY  06/29/2011   diminutive polyp, diverticulosis, external hemorrhoids  . CORONARY STENT INTERVENTION N/A 09/29/2016   Procedure: Coronary Stent Intervention;  Surgeon: Adrian Prows, MD;  Location: [facility];  Service: Cardiovascular;  Laterality: N/A;  . DEEP NECK LYMPH NODE BIOPSY / EXCISION  1996   right LN   . ESOPHAGOGASTRODUODENOSCOPY  06/29/2011   notrmal, 54 Fr dilation (dysphagia)  . INTRAVASCULAR ULTRASOUND/IVUS N/A 09/29/2016   Procedure: Intravascular Ultrasound/IVUS;  Surgeon: Adrian Prows, MD;  Location: [facility];  Service: Cardiovascular;  Laterality: N/A;  . LEFT HEART CATH AND CORONARY ANGIOGRAPHY N/A 09/29/2016   Procedure: Left Heart Cath and Coronary Angiography;  Surgeon: Adrian Prows, MD;  Location: [facility];  Service: Cardiovascular;  Laterality: N/A;  . SPLENECTOMY, TOTAL  1996  . STRABISMUS SURGERY  02/2009   left  . TONSILLECTOMY  age 1-18   Social History   Tobacco Use  . Smoking status: Never Smoker  . Smokeless tobacco: Never Used  Substance Use Topics  . Alcohol use: Yes    Alcohol/week: 7.0 standard drinks    Types: 7 Glasses of wine per week    ROS  Review of Systems  Cardiovascular: Positive for palpitations. Negative for chest pain, dyspnea on exertion and leg swelling.  Gastrointestinal: Negative for melena.  Neurological: Positive for dizziness.   Objective  Blood pressure 129/66, pulse 60, temperature 98 F (36.7 C), temperature source Temporal,  resp. rate 16, height '5\' 6"'  (1.676 m), weight 151 lb 12.8 oz (68.9 kg), SpO2 97 %.  Vitals with BMI 08/28/2019 07/12/2019 07/12/2019  Height '5\' 6"'  - '5\' 6"'   Weight 151 lbs 13 oz - 153 lbs  BMI 36.64 - 40.34  Systolic 742 595 638  Diastolic 66 84 84  Pulse 60 - 85    No data found.   Physical Exam  Cardiovascular: Normal rate, regular rhythm, intact distal pulses and normal pulses. Exam reveals a midsystolic click. Exam reveals no gallop.  Murmur heard.  Crescendo-decrescendo mid to late systolic murmur is present with a grade of 2/6 at the apex. Pulses:      Carotid pulses are on the right side with bruit. No leg edema, no JVD.  Pulmonary/Chest: Effort normal and breath sounds normal.  Abdominal: Soft. Bowel sounds are normal.   Laboratory examination:   No results for input(s): NA, K, CL, CO2, GLUCOSE, BUN, CREATININE, CALCIUM, GFRNONAA, GFRAA in the last 8760 hours. CrCl cannot be calculated (Patient's most recent lab result is older than the maximum 21 days allowed.).  CMP Latest Ref Rng & Units 09/30/2016 09/29/2016 06/15/2011  Glucose 65 - 99 mg/dL 109(H) 168(H) 84  BUN 6 - 20 mg/dL '11 9 8  ' Creatinine 0.44 - 1.00 mg/dL 0.84 0.85 0.73  Sodium 135 - 145 mmol/L 136 135 137  Potassium 3.5 - 5.1 mmol/L 5.4(H) 3.9 4.4  Chloride 101 - 111 mmol/L 103 101 109  CO2 22 - 32 mmol/L '26 24 22  ' Calcium 8.9 - 10.3 mg/dL 9.5 9.2 7.3(L)  Total Protein 6.0 - 8.3 g/dL - - 5.0(L)  Total Bilirubin 0.3 - 1.2 mg/dL - - 0.2(L)  Alkaline Phos 39 - 117 U/L - - 27(L)  AST 0 - 37 U/L - - 30  ALT 0 - 35 U/L - - 16   CBC Latest Ref Rng & Units 09/30/2016 09/29/2016 06/15/2011  WBC 4.0 - 10.5 K/uL 8.2 6.1 5.4  Hemoglobin 12.0 - 15.0 g/dL 11.4(L) 12.3 10.7(L)  Hematocrit 36.0 - 46.0 % 33.8(L) 36.7 31.8(L)  Platelets 150 - 400 K/uL 284 287 266   Lipid Panel     Component Value Date/Time   CHOL 225 (H) 09/29/2016 1505   TRIG 88 09/29/2016 1505   HDL 71 09/29/2016 1505   CHOLHDL 3.2 09/29/2016 1505    VLDL 18 09/29/2016 1505   LDLCALC 136 (H) 09/29/2016 1505   HEMOGLOBIN A1C Lab Results  Component Value Date   HGBA1C 5.5 09/30/2016   MPG 111 09/30/2016   TSH No results for input(s): TSH in the last 8760 hours.  Labs 03/24/2019:   Cholesterol, total 278.000 M 03/24/2019  HDL 82.000 M 03/24/2019 LDL 78.000 mg 03/16/2018 Triglycerides 81.000 M 03/24/2019, non-HDL 196. A1C 5.600 % of 03/17/2019; TSH 5.660 03/24/2019    Hemoglobin 12.500 G/ 03/24/2019 Platelets 344.000 X 03/24/2019 Creatinine, Serum 0.910 MG/ 03/24/2019 Potassium 4.700 mm  03/16/2018 ALT (SGPT) 23.000 IU/ 03/24/2019  Medications and allergies   Allergies  Allergen Reactions  . Other     OPIATES & CODEINE CAUSE GI UPSET  . Zofran     zofran causes drop in blood pressure     Current Outpatient Medications  Medication Instructions  . ALPRAZolam (XANAX) 1 MG tablet 1 tablet, Oral, Daily at bedtime  . aspirin 81 mg, Oral, Daily  . buPROPion (WELLBUTRIN XL) 150 mg, Oral, Daily  . calcium carbonate (CALCIUM 600) 600 mg, Oral, Daily  . ezetimibe (ZETIA) 10 mg, Oral, Daily  . MAGNESIUM CITRATE PO Oral, Daily  . Omega-3 1400 MG CAPS Oral, Daily  . propranolol (INDERAL) 20 MG tablet SMARTSIG:0.5-1 Tablet(s) By Mouth 1 to 2 Times Daily  . Rexulti 1 mg, Oral, Daily  . rosuvastatin (CRESTOR) 10 mg, Oral, Every other day  . TURMERIC PO 80 mg, Oral, Daily  . VITAMIN D PO Oral, Daily  . Vitamin D 2,500 Units, Oral, Daily    Radiology:  Aortic atherosclerosis (I70.0)  CT of the chest without contrast 11/10/2016 [facility]):  Mild to moderate coronary artery calcification, normal thoracic aorta, mild calcification of the aortic valve and aortic root. Fibrotic changes in both upper lobes likely related to prior radiation therapy.   VQ Scan at [facility] 11/10/2016: Negative for PE.  Cardiac Studies:   Coronary angiogram 5/80/99:IPJASNK LV systolic function, EF 53%. Left dominant circulation. Mild disease in the  ramus and LAD. Circumflex midsegment 90% stenosis by intravascular ultrasound. Successful stenting with 4.0 x 18 mm Onyx DES.  Treadmill exercise stress test 10/23/2016: Indication: CP and SoB Resting EKG demonstrates NSR. The patient exercised according to Bruce Protocol, Total time recorded 7:59 min achieving max heart rate of 136 which was 85% of THR for age and 10.11 METS of work.  Stress terminated due to dyspnea, lightheadedness and THR (>85% MPHR)/MPHR met. Normal BP response.  Patient developed LBBB with peak exercise which reversed to normal at 2 minutes into recovery. Post recovery of LBBB EKG shows 1 mm ST depression with T inversion in high lateral and lateral leads. Normal BP response. Rec: Equivocal stress test for ischemia with development of exercise induced LBBB. Clinical correlation recommended.  Carotid artery duplex  12/27/2018: No significant stenosis in the right carotid artery. Stenosis in the left internal carotid artery (16-49%). Antegrade right vertebral artery flow. Antegrade left vertebral artery flow. Compared to 02/01/2018, left carotid stenosis severity was previously > 50%. Follow up in one year is appropriate if clinically indicated.  Event monitor 07/21/2019: Normal sinus rhythm.  No significant arrhythmias.  Preliminary report. No significant change from  06/18/17 (Occasional PVC and PAC.  Lexiscan (Walking with Jaci Carrel) Sestamibi Stress Test 07/17/2019: Non-diagnostic ECG stress. Resting EKG/ECG demonstrated normal sinus rhythm with left bundle branch block. Peak EKG/ECG revealed no significant ST-T change from baseline abnormality. There is a fixed mild defect in the septal and apical regions. This defects can be present in LBBB. Also there is mild soft tissue attenuation in this region. Stress LV EF: 58%.  No wall motion abnormality.  Low risk study. No previous exam available for comparison.  Echocardiogram 07/21/2019:  Left ventricle cavity is normal in  size. Moderate concentric hypertrophy of the left ventricle. Normal LV systolic function with EF 66%. Normal global wall motion. Indeterminate diastolic filling pattern. Trileaflet aortic valve. Mild (Grade I) aortic regurgitation.  Moderate (Grade II) mitral regurgitation.  Mild tricuspid regurgitation. Estimated pulmonary artery systolic pressure is 22  mmHg.  No significant change compared to previous study in 2018.   Assessment     ICD-10-CM   1. Coronary artery disease of native artery of native heart with stable angina pectoris ([place])  I25.118 CANCELED: TSH  2. Near syncope  R55   3. Asymptomatic stenosis of left carotid artery without infarction  I65.22   4. Palpitations  R00.2 CANCELED: TSH  5. Hypercholesteremia  E78.00 rosuvastatin (CRESTOR) 10 MG tablet    ezetimibe (ZETIA) 10 MG tablet    LDL cholesterol, direct    Lipid Panel With LDL/HDL Ratio    Lipid Panel With LDL/HDL Ratio    LDL cholesterol, direct  6. Hypovitaminosis D  E55.9 Vitamin D 1,25 dihydroxy  7. Dyspnea on exertion  R06.00 DG Chest 2 View    Ambulatory referral to Pulmonology    EKG 07/12/2019: Normal sinus rhythm at rate of 78 bpm, left bundle branch block.  No further analysis. No significant change from EKG 01/20/2018.  Meds ordered this encounter  Medications  . rosuvastatin (CRESTOR) 10 MG tablet    Sig: Take 1 tablet (10 mg total) by mouth every other day.    Dispense:  90 tablet    Refill:  1  . ezetimibe (ZETIA) 10 MG tablet    Sig: Take 1 tablet (10 mg total) by mouth daily.    Dispense:  30 tablet    Refill:  6    Medications Discontinued During This Encounter  Medication Reason  . meloxicam (MOBIC) 15 MG tablet Error  . nitroGLYCERIN (NITROSTAT) 0.4 MG SL tablet Error  . rosuvastatin (CRESTOR) 10 MG tablet      Recommendations:   Cynthia Poole  is a 64 y.o. Caucasian female with hyperlipidemia, coronary artery disease status post PCI to the dominant circumflex in 2018, LBBB, history of  Hodgkin's lymphoma 1997 status post chemotherapy and radiation therapy to chest presents for f/u of shortness of breath, palpitations and dizziness lately although it has been a chronic problem.    I reviewed the results of the stress test and echocardiogram and also the event monitor.  She has had low risk stress test.  With regard to palpitations, dizziness, she has not had any further episodes and on the event monitor she did not have any activated events, will continue to monitor.  She was also started on propranolol both for anxiety and palpitations and symptoms improved along with improvement in blood pressure.  She has not had any chest pain.  Due to marked decreased exercise Capacity and also worsening dyspnea, I ordered chest x-ray PA and lateral view.  I will also refer to be evaluated by pulmonary medicine.  I am concerned about prior radiation exposure to her chest.  She does have mitral valve regurgitation, she probably has mitral valve prolapse as I can hear a mitral valve click.  It is only mild to moderate and we will continue to monitor this.  I had recommended low dose crestor although her lipids are normal in view of generalized atherosclerosis.  She has developed myalgias hence advised her to use Crestor every other day and will also add Zetia 10 mg daily and will check lipids in 3 months.  She does have low vitamin D levels, presently on supplements, will check vitamin D levels as well.  This may explain some of her myalgias.  We will continue carotid artery surveillance on an annual basis for now.  I will see her back in a year or sooner if  problems.   Add: Patient wants to try Prava 20 mg first. Rx sent  Adrian Prows, MD, [facility] 08/28/2019, 6:36 PM [facility]. [place] Office: [phone]

## 2019-08-29 ENCOUNTER — Ambulatory Visit: Payer: BC Managed Care – PPO | Admitting: Cardiology

## 2019-08-29 DIAGNOSIS — F331 Major depressive disorder, recurrent, moderate: Secondary | ICD-10-CM | POA: Diagnosis not present

## 2019-08-29 DIAGNOSIS — E559 Vitamin D deficiency, unspecified: Secondary | ICD-10-CM | POA: Diagnosis not present

## 2019-08-29 DIAGNOSIS — F341 Dysthymic disorder: Secondary | ICD-10-CM | POA: Diagnosis not present

## 2019-09-04 NOTE — Progress Notes (Deleted)
Office Visit Note  Patient: Cynthia Poole             Date of Birth: 1956/03/11           MRN: [id number]             PCP: Leanna Battles, MD Referring: Leanna Battles, MD Visit Date: 09/11/2019 Occupation: @GUAROCC @  Subjective:  No chief complaint on file.   History of Present Illness: Cynthia Poole is a 64 y.o. female ***   Activities of Daily Living:  Patient reports morning stiffness for *** {minute/hour:19697}.   Patient {ACTIONS;DENIES/REPORTS:21021675::"Denies"} nocturnal pain.  Difficulty dressing/grooming: {ACTIONS;DENIES/REPORTS:21021675::"Denies"} Difficulty climbing stairs: {ACTIONS;DENIES/REPORTS:21021675::"Denies"} Difficulty getting out of chair: {ACTIONS;DENIES/REPORTS:21021675::"Denies"} Difficulty using hands for taps, buttons, cutlery, and/or writing: {ACTIONS;DENIES/REPORTS:21021675::"Denies"}  No Rheumatology ROS completed.   PMFS History:  Patient Active Problem List   Diagnosis Date Noted  . Anxiety and depression 02/14/2019  . Lumbosacral radiculopathy at S1 01/12/2018  . Coronary artery disease involving native coronary artery of native heart without angina pectoris 11/02/2016  . Hyperlipidemia 11/02/2016  . Unstable angina pectoris ([place]) 09/29/2016  . Esophageal dysmotility 07/27/2014  . Personal history of colonic adenoma 06/29/2011  . Lymphoma in remission ([place]) 06/14/2011    Class: Chronic  . S/P splenectomy 06/14/2011    Class: Chronic  . Mitral valve prolapse 06/14/2011    Class: Chronic  . Low back pain 06/14/2011    Class: Chronic  . Fibromyalgia 06/14/2011    Class: Chronic  . IBS (irritable bowel syndrome) 06/10/2011    Past Medical History:  Diagnosis Date  . Anemia   . CAD in native artery 09/29/2016   Coronary angiogram 0000000 LV systolic function, EF 123456. Left dominant circulation. Mild disease in the ramus and LAD. Circumflex midsegment  90% by IVUS S/P stenting 4.0 x 18 mm Onyx DES.  . DDD (degenerative disc  disease), cervical   . DDD (degenerative disc disease), lumbar   . Depression   . Fibromyalgia 03/2004  . GERD (gastroesophageal reflux disease)   . Heart murmur   . History of pleurisy   . HLD (hyperlipidemia)   . Hodgkin's disease 1997    S/P Chemo, Chest RT and splenectomy  . Hypertension    pt denies  . Hypothyroidism    no longer per pt   . Insomnia   . Internal and external bleeding hemorrhoids   . Migraines   . MVP (mitral valve prolapse)   . Osteopenia   . Personal history of colonic adenoma 06/29/2011   06/29/2011 - diminutive adenoma  . Seasonal allergies   . Shingles 2018  . Vitamin D deficiency     Family History  Problem Relation Age of Onset  . Heart disease Maternal Grandfather   . Heart disease Paternal Grandfather   . Aneurysm Father        AAA  . Hypertension Father   . Tuberculosis Father   . Breast cancer Maternal Grandmother   . Breast cancer Maternal Aunt   . Arthritis Mother   . Breast cancer Sister        x 2   . Healthy Son   . Healthy Daughter   . Colon cancer Neg Hx    Past Surgical History:  Procedure Laterality Date  . BUNIONECTOMY    . CARDIAC CATHETERIZATION  09/29/2016  . COLONOSCOPY  09/02/2007   redundant colon, external hemorrhoids  . COLONOSCOPY  06/29/2011   diminutive polyp, diverticulosis, external hemorrhoids  . CORONARY STENT INTERVENTION N/A 09/29/2016  Procedure: Coronary Stent Intervention;  Surgeon: Adrian Prows, MD;  Location: [place] [facility];  Service: Cardiovascular;  Laterality: N/A;  . DEEP NECK LYMPH NODE BIOPSY / EXCISION  1996   right LN   . ESOPHAGOGASTRODUODENOSCOPY  06/29/2011   notrmal, 54 Fr dilation (dysphagia)  . INTRAVASCULAR ULTRASOUND/IVUS N/A 09/29/2016   Procedure: Intravascular Ultrasound/IVUS;  Surgeon: Adrian Prows, MD;  Location: [place] [facility];  Service: Cardiovascular;  Laterality: N/A;  . LEFT HEART CATH AND CORONARY ANGIOGRAPHY N/A 09/29/2016   Procedure: Left Heart Cath and Coronary Angiography;   Surgeon: Adrian Prows, MD;  Location: [place] [facility];  Service: Cardiovascular;  Laterality: N/A;  . SPLENECTOMY, TOTAL  1996  . STRABISMUS SURGERY  02/2009   left  . TONSILLECTOMY     age 4-18   Social History   Social History Narrative   Caffeine daily    Lives at home alone   Right handed    There is no immunization history on file for this patient.   Objective: Vital Signs: There were no vitals taken for this visit.   Physical Exam   Musculoskeletal Exam: ***  CDAI Exam: CDAI Score: -- Patient Global: --; Provider Global: -- Swollen: --; Tender: -- Joint Exam 09/11/2019   No joint exam has been documented for this visit   There is currently no information documented on the homunculus. Go to the Rheumatology activity and complete the homunculus joint exam.  Investigation: No additional findings.  Imaging: No results found.  Recent Labs: Lab Results  Component Value Date   WBC 8.2 09/30/2016   HGB 11.4 (L) 09/30/2016   PLT 284 09/30/2016   NA 136 09/30/2016   K 5.4 (H) 09/30/2016   CL 103 09/30/2016   CO2 26 09/30/2016   GLUCOSE 109 (H) 09/30/2016   BUN 11 09/30/2016   CREATININE 0.84 09/30/2016   BILITOT 0.2 (L) 06/15/2011   ALKPHOS 27 (L) 06/15/2011   AST 30 06/15/2011   ALT 16 06/15/2011   PROT 5.0 (L) 06/15/2011   ALBUMIN 2.4 (L) 06/15/2011   CALCIUM 9.5 09/30/2016   GFRAA >60 09/30/2016    Speciality Comments: No specialty comments available.  Procedures:  No procedures performed Allergies: Other and Zofran   Assessment / Plan:     Visit Diagnoses: No diagnosis found.  Orders: No orders of the defined types were placed in this encounter.  No orders of the defined types were placed in this encounter.   Face-to-face time spent with patient was *** minutes. Greater than 50% of time was spent in counseling and coordination of care.  Follow-Up Instructions: No follow-ups on file.   Earnestine Mealing, CMA  Note - This record has  been created using Editor, commissioning.  Chart creation errors have been sought, but may not always  have been located. Such creation errors do not reflect on  the standard of medical care.

## 2019-09-09 LAB — VITAMIN D 1,25 DIHYDROXY
Vitamin D 1, 25 (OH)2 Total: 76 pg/mL — ABNORMAL HIGH
Vitamin D2 1, 25 (OH)2: <10 pg/mL
Vitamin D3 1, 25 (OH)2: 76 pg/mL

## 2019-09-11 ENCOUNTER — Ambulatory Visit: Payer: BC Managed Care – PPO | Admitting: Rheumatology

## 2019-09-11 DIAGNOSIS — F341 Dysthymic disorder: Secondary | ICD-10-CM | POA: Diagnosis not present

## 2019-09-11 DIAGNOSIS — F331 Major depressive disorder, recurrent, moderate: Secondary | ICD-10-CM | POA: Diagnosis not present

## 2019-09-29 DIAGNOSIS — Z1152 Encounter for screening for COVID-19: Secondary | ICD-10-CM | POA: Diagnosis not present

## 2019-09-29 DIAGNOSIS — J181 Lobar pneumonia, unspecified organism: Secondary | ICD-10-CM | POA: Diagnosis not present

## 2019-09-29 DIAGNOSIS — R05 Cough: Secondary | ICD-10-CM | POA: Diagnosis not present

## 2019-10-02 DIAGNOSIS — F331 Major depressive disorder, recurrent, moderate: Secondary | ICD-10-CM | POA: Diagnosis not present

## 2019-10-02 DIAGNOSIS — F341 Dysthymic disorder: Secondary | ICD-10-CM | POA: Diagnosis not present

## 2019-10-16 DIAGNOSIS — F331 Major depressive disorder, recurrent, moderate: Secondary | ICD-10-CM | POA: Diagnosis not present

## 2019-10-16 DIAGNOSIS — F341 Dysthymic disorder: Secondary | ICD-10-CM | POA: Diagnosis not present

## 2019-10-19 ENCOUNTER — Ambulatory Visit: Payer: BC Managed Care – PPO | Attending: Internal Medicine

## 2019-10-19 DIAGNOSIS — Z23 Encounter for immunization: Secondary | ICD-10-CM

## 2019-10-19 NOTE — Progress Notes (Signed)
   Covid-19 Vaccination Clinic  Name:  NELYA COGDELL    MRN: [id number] DOB: 1956/05/16  10/19/2019  Ms. Sikkenga was observed post Covid-19 immunization for 15 minutes without incident. She was provided with Vaccine Information Sheet and instruction to access the V-Safe system.   Ms. Flannelly was instructed to call 911 with any severe reactions post vaccine: Marland Kitchen Difficulty breathing  . Swelling of face and throat  . A fast heartbeat  . A bad rash all over body  . Dizziness and weakness   Immunizations Administered    Name Date Dose VIS Date Route   Pfizer COVID-19 Vaccine 10/19/2019 10:25 AM 0.3 mL 08/16/2018 Intramuscular   Manufacturer: [place]   Lot: [id number]   [place]: [id number]

## 2019-10-23 DIAGNOSIS — F331 Major depressive disorder, recurrent, moderate: Secondary | ICD-10-CM | POA: Diagnosis not present

## 2019-10-23 DIAGNOSIS — F341 Dysthymic disorder: Secondary | ICD-10-CM | POA: Diagnosis not present

## 2019-10-30 DIAGNOSIS — J181 Lobar pneumonia, unspecified organism: Secondary | ICD-10-CM | POA: Diagnosis not present

## 2019-11-10 DIAGNOSIS — F411 Generalized anxiety disorder: Secondary | ICD-10-CM | POA: Diagnosis not present

## 2019-11-10 DIAGNOSIS — F332 Major depressive disorder, recurrent severe without psychotic features: Secondary | ICD-10-CM | POA: Diagnosis not present

## 2019-11-13 ENCOUNTER — Ambulatory Visit: Payer: BC Managed Care – PPO

## 2019-11-16 ENCOUNTER — Ambulatory Visit: Payer: BC Managed Care – PPO

## 2019-11-17 DIAGNOSIS — Z85828 Personal history of other malignant neoplasm of skin: Secondary | ICD-10-CM | POA: Diagnosis not present

## 2019-11-17 DIAGNOSIS — L245 Irritant contact dermatitis due to other chemical products: Secondary | ICD-10-CM | POA: Diagnosis not present

## 2019-11-21 ENCOUNTER — Ambulatory Visit: Payer: BC Managed Care – PPO | Attending: Internal Medicine

## 2019-11-21 DIAGNOSIS — Z23 Encounter for immunization: Secondary | ICD-10-CM

## 2019-11-21 NOTE — Progress Notes (Signed)
   Covid-19 Vaccination Clinic  Name:  Cynthia Poole    MRN: [id number] DOB: 1956-06-01  11/21/2019  [name] was observed post Covid-19 immunization for 15 minutes without incident. She was provided with Vaccine Information Sheet and instruction to access the V-Safe system.   [name] was instructed to call 911 with any severe reactions post vaccine: Marland Kitchen Difficulty breathing  . Swelling of face and throat  . A fast heartbeat  . A bad rash all over body  . Dizziness and weakness   Immunizations Administered    Name Date Dose VIS Date Route   Pfizer COVID-19 Vaccine 11/21/2019 12:08 PM 0.3 mL 08/16/2018 Intramuscular   Manufacturer: [place]   Lot: [id number]   [place]: [id number]

## 2019-11-24 DIAGNOSIS — F331 Major depressive disorder, recurrent, moderate: Secondary | ICD-10-CM | POA: Diagnosis not present

## 2019-11-27 DIAGNOSIS — F331 Major depressive disorder, recurrent, moderate: Secondary | ICD-10-CM | POA: Diagnosis not present

## 2019-12-06 DIAGNOSIS — F411 Generalized anxiety disorder: Secondary | ICD-10-CM | POA: Diagnosis not present

## 2019-12-06 DIAGNOSIS — F331 Major depressive disorder, recurrent, moderate: Secondary | ICD-10-CM | POA: Diagnosis not present

## 2019-12-18 DIAGNOSIS — F341 Dysthymic disorder: Secondary | ICD-10-CM | POA: Diagnosis not present

## 2019-12-18 DIAGNOSIS — F331 Major depressive disorder, recurrent, moderate: Secondary | ICD-10-CM | POA: Diagnosis not present

## 2019-12-26 DIAGNOSIS — F331 Major depressive disorder, recurrent, moderate: Secondary | ICD-10-CM | POA: Diagnosis not present

## 2019-12-26 DIAGNOSIS — F341 Dysthymic disorder: Secondary | ICD-10-CM | POA: Diagnosis not present

## 2020-01-01 DIAGNOSIS — F331 Major depressive disorder, recurrent, moderate: Secondary | ICD-10-CM | POA: Diagnosis not present

## 2020-01-01 DIAGNOSIS — F341 Dysthymic disorder: Secondary | ICD-10-CM | POA: Diagnosis not present

## 2020-01-03 DIAGNOSIS — F331 Major depressive disorder, recurrent, moderate: Secondary | ICD-10-CM | POA: Diagnosis not present

## 2020-01-04 DIAGNOSIS — N951 Menopausal and female climacteric states: Secondary | ICD-10-CM | POA: Diagnosis not present

## 2020-01-08 DIAGNOSIS — F341 Dysthymic disorder: Secondary | ICD-10-CM | POA: Diagnosis not present

## 2020-01-08 DIAGNOSIS — F331 Major depressive disorder, recurrent, moderate: Secondary | ICD-10-CM | POA: Diagnosis not present

## 2020-01-15 DIAGNOSIS — F331 Major depressive disorder, recurrent, moderate: Secondary | ICD-10-CM | POA: Diagnosis not present

## 2020-01-15 DIAGNOSIS — F341 Dysthymic disorder: Secondary | ICD-10-CM | POA: Diagnosis not present

## 2020-01-23 DIAGNOSIS — F411 Generalized anxiety disorder: Secondary | ICD-10-CM | POA: Diagnosis not present

## 2020-01-23 DIAGNOSIS — F331 Major depressive disorder, recurrent, moderate: Secondary | ICD-10-CM | POA: Diagnosis not present

## 2020-02-05 DIAGNOSIS — F341 Dysthymic disorder: Secondary | ICD-10-CM | POA: Diagnosis not present

## 2020-02-05 DIAGNOSIS — F331 Major depressive disorder, recurrent, moderate: Secondary | ICD-10-CM | POA: Diagnosis not present

## 2020-02-12 DIAGNOSIS — F331 Major depressive disorder, recurrent, moderate: Secondary | ICD-10-CM | POA: Diagnosis not present

## 2020-02-12 DIAGNOSIS — F341 Dysthymic disorder: Secondary | ICD-10-CM | POA: Diagnosis not present

## 2020-02-19 DIAGNOSIS — F331 Major depressive disorder, recurrent, moderate: Secondary | ICD-10-CM | POA: Diagnosis not present

## 2020-02-19 DIAGNOSIS — F341 Dysthymic disorder: Secondary | ICD-10-CM | POA: Diagnosis not present

## 2020-02-23 ENCOUNTER — Ambulatory Visit: Payer: BC Managed Care – PPO | Admitting: Cardiology

## 2020-02-23 DIAGNOSIS — F331 Major depressive disorder, recurrent, moderate: Secondary | ICD-10-CM | POA: Diagnosis not present

## 2020-02-23 DIAGNOSIS — F341 Dysthymic disorder: Secondary | ICD-10-CM | POA: Diagnosis not present

## 2020-02-23 DIAGNOSIS — F411 Generalized anxiety disorder: Secondary | ICD-10-CM | POA: Diagnosis not present

## 2020-02-23 NOTE — Progress Notes (Deleted)
Office Visit Note  Patient: Cynthia HOGENSON             Date of Birth: 1956/06/16           MRN: [id number]             PCP: Leanna Battles, MD Referring: Leanna Battles, MD Visit Date: 03/07/2020 Occupation: @GUAROCC @  Subjective:  No chief complaint on file.   History of Present Illness: Cynthia Poole is a 64 y.o. female ***   Activities of Daily Living:  Patient reports morning stiffness for *** {minute/hour:19697}.   Patient {ACTIONS;DENIES/REPORTS:21021675::"Denies"} nocturnal pain.  Difficulty dressing/grooming: {ACTIONS;DENIES/REPORTS:21021675::"Denies"} Difficulty climbing stairs: {ACTIONS;DENIES/REPORTS:21021675::"Denies"} Difficulty getting out of chair: {ACTIONS;DENIES/REPORTS:21021675::"Denies"} Difficulty using hands for taps, buttons, cutlery, and/or writing: {ACTIONS;DENIES/REPORTS:21021675::"Denies"}  No Rheumatology ROS completed.   PMFS History:  Patient Active Problem List   Diagnosis Date Noted  . Anxiety and depression 02/14/2019  . Lumbosacral radiculopathy at S1 01/12/2018  . Coronary artery disease involving native coronary artery of native heart without angina pectoris 11/02/2016  . Hyperlipidemia 11/02/2016  . Unstable angina pectoris ([place]) 09/29/2016  . Esophageal dysmotility 07/27/2014  . Personal history of colonic adenoma 06/29/2011  . Lymphoma in remission ([place]) 06/14/2011    Class: Chronic  . S/P splenectomy 06/14/2011    Class: Chronic  . Mitral valve prolapse 06/14/2011    Class: Chronic  . Low back pain 06/14/2011    Class: Chronic  . Fibromyalgia 06/14/2011    Class: Chronic  . IBS (irritable bowel syndrome) 06/10/2011    Past Medical History:  Diagnosis Date  . Anemia   . CAD in native artery 09/29/2016   Coronary angiogram 1/66/06:TKZSWFU LV systolic function, EF 93%. Left dominant circulation. Mild disease in the ramus and LAD. Circumflex midsegment  90% by IVUS S/P stenting 4.0 x 18 mm Onyx DES.  . DDD (degenerative disc  disease), cervical   . DDD (degenerative disc disease), lumbar   . Depression   . Fibromyalgia 03/2004  . GERD (gastroesophageal reflux disease)   . Heart murmur   . History of pleurisy   . HLD (hyperlipidemia)   . Hodgkin's disease 1997    S/P Chemo, Chest RT and splenectomy  . Hypertension    pt denies  . Hypothyroidism    no longer per pt   . Insomnia   . Internal and external bleeding hemorrhoids   . Migraines   . MVP (mitral valve prolapse)   . Osteopenia   . Personal history of colonic adenoma 06/29/2011   06/29/2011 - diminutive adenoma  . Seasonal allergies   . Shingles 2018  . Vitamin D deficiency     Family History  Problem Relation Age of Onset  . Heart disease Maternal Grandfather   . Heart disease Paternal Grandfather   . Aneurysm Father        AAA  . Hypertension Father   . Tuberculosis Father   . Breast cancer Maternal Grandmother   . Breast cancer Maternal Aunt   . Arthritis Mother   . Breast cancer Sister        x 2   . Healthy Son   . Healthy Daughter   . Colon cancer Neg Hx    Past Surgical History:  Procedure Laterality Date  . BUNIONECTOMY    . CARDIAC CATHETERIZATION  09/29/2016  . COLONOSCOPY  09/02/2007   redundant colon, external hemorrhoids  . COLONOSCOPY  06/29/2011   diminutive polyp, diverticulosis, external hemorrhoids  . CORONARY STENT INTERVENTION N/A 09/29/2016  Procedure: Coronary Stent Intervention;  Surgeon: Adrian Prows, MD;  Location: [facility];  Service: Cardiovascular;  Laterality: N/A;  . DEEP NECK LYMPH NODE BIOPSY / EXCISION  1996   right LN   . ESOPHAGOGASTRODUODENOSCOPY  06/29/2011   notrmal, 54 Fr dilation (dysphagia)  . INTRAVASCULAR ULTRASOUND/IVUS N/A 09/29/2016   Procedure: Intravascular Ultrasound/IVUS;  Surgeon: Adrian Prows, MD;  Location: [facility];  Service: Cardiovascular;  Laterality: N/A;  . LEFT HEART CATH AND CORONARY ANGIOGRAPHY N/A 09/29/2016   Procedure: Left Heart Cath and Coronary Angiography;   Surgeon: Adrian Prows, MD;  Location: [facility];  Service: Cardiovascular;  Laterality: N/A;  . SPLENECTOMY, TOTAL  1996  . STRABISMUS SURGERY  02/2009   left  . TONSILLECTOMY     age 48-18   Social History   Social History Narrative   Caffeine daily    Lives at home alone   Right handed   Immunization History  Administered Date(s) Administered  . PFIZER SARS-COV-2 Vaccination 10/19/2019, 11/21/2019     Objective: Vital Signs: There were no vitals taken for this visit.   Physical Exam   Musculoskeletal Exam: ***  CDAI Exam: CDAI Score: -- Patient Global: --; Provider Global: -- Swollen: --; Tender: -- Joint Exam 03/07/2020   No joint exam has been documented for this visit   There is currently no information documented on the homunculus. Go to the Rheumatology activity and complete the homunculus joint exam.  Investigation: No additional findings.  Imaging: No results found.  Recent Labs: Lab Results  Component Value Date   WBC 8.2 09/30/2016   HGB 11.4 (L) 09/30/2016   PLT 284 09/30/2016   NA 136 09/30/2016   K 5.4 (H) 09/30/2016   CL 103 09/30/2016   CO2 26 09/30/2016   GLUCOSE 109 (H) 09/30/2016   BUN 11 09/30/2016   CREATININE 0.84 09/30/2016   BILITOT 0.2 (L) 06/15/2011   ALKPHOS 27 (L) 06/15/2011   AST 30 06/15/2011   ALT 16 06/15/2011   PROT 5.0 (L) 06/15/2011   ALBUMIN 2.4 (L) 06/15/2011   CALCIUM 9.5 09/30/2016   GFRAA >60 09/30/2016    Speciality Comments: No specialty comments available.  Procedures:  No procedures performed Allergies: Zofran, Codeine, and Other   Assessment / Plan:     Visit Diagnoses: No diagnosis found.  Orders: No orders of the defined types were placed in this encounter.  No orders of the defined types were placed in this encounter.   Face-to-face time spent with patient was *** minutes. Greater than 50% of time was spent in counseling and coordination of care.  Follow-Up Instructions: No  follow-ups on file.   Earnestine Mealing, CMA  Note - This record has been created using Editor, commissioning.  Chart creation errors have been sought, but may not always  have been located. Such creation errors do not reflect on  the standard of medical care.

## 2020-03-04 DIAGNOSIS — F341 Dysthymic disorder: Secondary | ICD-10-CM | POA: Diagnosis not present

## 2020-03-04 DIAGNOSIS — F331 Major depressive disorder, recurrent, moderate: Secondary | ICD-10-CM | POA: Diagnosis not present

## 2020-03-07 ENCOUNTER — Ambulatory Visit: Payer: BC Managed Care – PPO | Admitting: Physician Assistant

## 2020-03-13 ENCOUNTER — Ambulatory Visit: Payer: BC Managed Care – PPO | Admitting: Cardiology

## 2020-03-14 ENCOUNTER — Ambulatory Visit: Payer: BC Managed Care – PPO | Admitting: Cardiology

## 2020-03-15 DIAGNOSIS — F341 Dysthymic disorder: Secondary | ICD-10-CM | POA: Diagnosis not present

## 2020-03-15 DIAGNOSIS — F331 Major depressive disorder, recurrent, moderate: Secondary | ICD-10-CM | POA: Diagnosis not present

## 2020-03-18 DIAGNOSIS — F331 Major depressive disorder, recurrent, moderate: Secondary | ICD-10-CM | POA: Diagnosis not present

## 2020-03-18 DIAGNOSIS — F341 Dysthymic disorder: Secondary | ICD-10-CM | POA: Diagnosis not present

## 2020-03-27 DIAGNOSIS — F411 Generalized anxiety disorder: Secondary | ICD-10-CM | POA: Diagnosis not present

## 2020-03-27 DIAGNOSIS — F331 Major depressive disorder, recurrent, moderate: Secondary | ICD-10-CM | POA: Diagnosis not present

## 2020-04-01 DIAGNOSIS — F341 Dysthymic disorder: Secondary | ICD-10-CM | POA: Diagnosis not present

## 2020-04-01 DIAGNOSIS — F331 Major depressive disorder, recurrent, moderate: Secondary | ICD-10-CM | POA: Diagnosis not present

## 2020-04-02 ENCOUNTER — Other Ambulatory Visit (HOSPITAL_COMMUNITY): Payer: Self-pay | Admitting: Family Medicine

## 2020-04-02 DIAGNOSIS — M79662 Pain in left lower leg: Secondary | ICD-10-CM | POA: Diagnosis not present

## 2020-04-03 ENCOUNTER — Encounter (HOSPITAL_COMMUNITY): Payer: BC Managed Care – PPO

## 2020-04-05 ENCOUNTER — Other Ambulatory Visit: Payer: Self-pay

## 2020-04-05 ENCOUNTER — Ambulatory Visit (HOSPITAL_COMMUNITY)
Admission: RE | Admit: 2020-04-05 | Discharge: 2020-04-05 | Disposition: A | Discharge status: Home / Self Care | Payer: BC Managed Care – PPO | Source: Ambulatory Visit | Attending: Family Medicine | Admitting: Family Medicine

## 2020-04-05 DIAGNOSIS — M79662 Pain in left lower leg: Secondary | ICD-10-CM | POA: Diagnosis not present

## 2020-04-08 DIAGNOSIS — R002 Palpitations: Secondary | ICD-10-CM | POA: Diagnosis not present

## 2020-04-08 DIAGNOSIS — F331 Major depressive disorder, recurrent, moderate: Secondary | ICD-10-CM | POA: Diagnosis not present

## 2020-04-08 DIAGNOSIS — E7800 Pure hypercholesterolemia, unspecified: Secondary | ICD-10-CM | POA: Diagnosis not present

## 2020-04-08 DIAGNOSIS — I25118 Atherosclerotic heart disease of native coronary artery with other forms of angina pectoris: Secondary | ICD-10-CM | POA: Diagnosis not present

## 2020-04-08 DIAGNOSIS — F341 Dysthymic disorder: Secondary | ICD-10-CM | POA: Diagnosis not present

## 2020-04-09 LAB — LIPID PANEL WITH LDL/HDL RATIO
Cholesterol, Total: 266 mg/dL — ABNORMAL HIGH (ref 100–199)
HDL: 70 mg/dL (ref >39)
LDL Chol Calc (NIH): 177 mg/dL — ABNORMAL HIGH (ref 0–99)
LDL/HDL Ratio: 2.5 ratio (ref 0.0–3.2)
Triglycerides: 110 mg/dL (ref 0–149)
VLDL Cholesterol Cal: 19 mg/dL (ref 5–40)

## 2020-04-09 LAB — TSH: TSH: 4.59 u[IU]/mL — ABNORMAL HIGH (ref 0.450–4.500)

## 2020-04-09 LAB — LDL CHOLESTEROL, DIRECT: LDL Direct: 180 mg/dL — ABNORMAL HIGH (ref 0–99)

## 2020-04-11 ENCOUNTER — Other Ambulatory Visit: Payer: Self-pay

## 2020-04-11 ENCOUNTER — Ambulatory Visit: Payer: BC Managed Care – PPO | Admitting: Cardiology

## 2020-04-11 ENCOUNTER — Encounter: Payer: Self-pay | Admitting: Cardiology

## 2020-04-11 VITALS — BP 116/63 | HR 82 | Resp 16 | Ht 66.0 in | Wt 157.0 lb

## 2020-04-11 DIAGNOSIS — R0609 Other forms of dyspnea: Secondary | ICD-10-CM | POA: Diagnosis not present

## 2020-04-11 DIAGNOSIS — I447 Left bundle-branch block, unspecified: Secondary | ICD-10-CM | POA: Diagnosis not present

## 2020-04-11 DIAGNOSIS — I341 Nonrheumatic mitral (valve) prolapse: Secondary | ICD-10-CM

## 2020-04-11 DIAGNOSIS — I6522 Occlusion and stenosis of left carotid artery: Secondary | ICD-10-CM

## 2020-04-11 DIAGNOSIS — I25118 Atherosclerotic heart disease of native coronary artery with other forms of angina pectoris: Secondary | ICD-10-CM | POA: Diagnosis not present

## 2020-04-11 DIAGNOSIS — E7800 Pure hypercholesterolemia, unspecified: Secondary | ICD-10-CM

## 2020-04-11 DIAGNOSIS — R0600 Dyspnea, unspecified: Secondary | ICD-10-CM

## 2020-04-11 MED ORDER — NEXLIZET 180-10 MG PO TABS: 1.0000 | ORAL_TABLET | Freq: Every day | ORAL | 2 refills | Status: DC

## 2020-04-11 MED ORDER — ATORVASTATIN CALCIUM 10 MG PO TABS: 10.0000 mg | ORAL_TABLET | Freq: Every day | ORAL | 2 refills | Status: DC

## 2020-04-11 NOTE — Progress Notes (Signed)
Primary Physician/Referring:  Leanna Battles, MD  Patient ID: Cynthia Poole, female    DOB: 1956/04/15, 64 y.o.   MRN: [id number]  Chief Complaint  Patient presents with  . Coronary Artery Disease  . Hyperlipidemia  . Shortness of Breath  . Follow-up    6 month   HPI:    Cynthia Poole  is a 64 y.o. Caucasian female with hyperlipidemia, coronary artery disease status post PCI to the dominant circumflex in 2018, LBBB, history of Hodgkin's lymphoma 1997 status post chemotherapy and radiation therapy to chest presents for annual visit. She made this visit also due to frequent palpations again since yesterday.  She has hyperlipidemia, MVP with mild to moderate MR.  She is here on a 64-monthoffice visit, states that palpitation symptoms are significantly improved and she has discontinued beta-blockers.  She has discontinued statins as a cause myalgias and she is worried about dementia.  Denies chest pain, states that her dyspnea has remained stable.   Past Medical History:  Diagnosis Date  . Anemia   . CAD in native artery 09/29/2016   Coronary angiogram 43/21/22:QMGNOIBLV systolic function, EF 670% Left dominant circulation. Mild disease in the ramus and LAD. Circumflex midsegment  90% by IVUS S/P stenting 4.0 x 18 mm Onyx DES.  . DDD (degenerative disc disease), cervical   . DDD (degenerative disc disease), lumbar   . Depression   . Fibromyalgia 03/2004  . GERD (gastroesophageal reflux disease)   . Heart murmur   . History of pleurisy   . HLD (hyperlipidemia)   . Hodgkin's disease 1997    S/P Chemo, Chest RT and splenectomy  . Hypertension    pt denies  . Hypothyroidism    no longer per pt   . Insomnia   . Internal and external bleeding hemorrhoids   . Migraines   . MVP (mitral valve prolapse)   . Osteopenia   . Personal history of colonic adenoma 06/29/2011   06/29/2011 - diminutive adenoma  . Seasonal allergies   . Shingles 2018  . Vitamin D deficiency    Past Surgical  History:  Procedure Laterality Date  . BUNIONECTOMY    . CARDIAC CATHETERIZATION  09/29/2016  . COLONOSCOPY  09/02/2007   redundant colon, external hemorrhoids  . COLONOSCOPY  06/29/2011   diminutive polyp, diverticulosis, external hemorrhoids  . CORONARY STENT INTERVENTION N/A 09/29/2016   Procedure: Coronary Stent Intervention;  Surgeon: JAdrian Prows MD;  [facility];  Service: Cardiovascular;  Laterality: N/A;  . DEEP NECK LYMPH NODE BIOPSY / EXCISION  1996   right LN   . ESOPHAGOGASTRODUODENOSCOPY  06/29/2011   notrmal, 54 Fr dilation (dysphagia)  . INTRAVASCULAR ULTRASOUND/IVUS N/A 09/29/2016   Procedure: Intravascular Ultrasound/IVUS;  Surgeon: JAdrian Prows MD;  [facility];  Service: Cardiovascular;  Laterality: N/A;  . LEFT HEART CATH AND CORONARY ANGIOGRAPHY N/A 09/29/2016   Procedure: Left Heart Cath and Coronary Angiography;  Surgeon: JAdrian Prows MD;  [facility];  Service: Cardiovascular;  Laterality: N/A;  . SPLENECTOMY, TOTAL  1996  . STRABISMUS SURGERY  02/2009   left  . TONSILLECTOMY     age [age]  Social History   Tobacco Use  . Smoking status: Never Smoker  . Smokeless tobacco: Never Used  Substance Use Topics  . Alcohol use: Yes    Alcohol/week: 7.0 standard drinks    Types: 7 Glasses of wine per week    Comment: occasionally  Marital Status: Divorced   ROS  Review of Systems  Cardiovascular: Positive for dyspnea on exertion and palpitations. Negative for chest pain and leg swelling.  Gastrointestinal: Negative for melena.  Neurological: Positive for dizziness.   Objective  Blood pressure 116/63, pulse 82, resp. rate 16, height '5\' 6"'  (1.676 m), weight 157 lb (71.2 kg), SpO2 98 %.  Vitals with BMI 04/11/2020 08/28/2019 07/12/2019  Height '5\' 6"'  '5\' 6"'  -  Weight 157 lbs 151 lbs 13 oz -  BMI 01.60 10.93 -  Systolic 235 573 220  Diastolic 63 66 84  Pulse 82 60 -    No data found.   Physical Exam Cardiovascular:      Rate and Rhythm: Normal rate and regular rhythm.     Pulses: Normal pulses and intact distal pulses. A midsystolic click.          Carotid pulses are on the right side with bruit.    Heart sounds: Murmur heard.  Crescendo-decrescendo mid to late systolic murmur is present with a grade of 2/6 at the apex.  No gallop.      Comments: No leg edema, no JVD. Pulmonary:     Effort: Pulmonary effort is normal.     Breath sounds: Normal breath sounds.  Abdominal:     General: Bowel sounds are normal.     Palpations: Abdomen is soft.    Laboratory examination:   No results for input(s): NA, K, CL, CO2, GLUCOSE, BUN, CREATININE, CALCIUM, GFRNONAA, GFRAA in the last 8760 hours. CrCl cannot be calculated (Patient's most recent lab result is older than the maximum 21 days allowed.).  CMP Latest Ref Rng & Units 09/30/2016 09/29/2016 06/15/2011  Glucose 65 - 99 mg/dL 109(H) 168(H) 84  BUN 6 - 20 mg/dL '11 9 8  ' Creatinine 0.44 - 1.00 mg/dL 0.84 0.85 0.73  Sodium 135 - 145 mmol/L 136 135 137  Potassium 3.5 - 5.1 mmol/L 5.4(H) 3.9 4.4  Chloride 101 - 111 mmol/L 103 101 109  CO2 22 - 32 mmol/L '26 24 22  ' Calcium 8.9 - 10.3 mg/dL 9.5 9.2 7.3(L)  Total Protein 6.0 - 8.3 g/dL - - 5.0(L)  Total Bilirubin 0.3 - 1.2 mg/dL - - 0.2(L)  Alkaline Phos 39 - 117 U/L - - 27(L)  AST 0 - 37 U/L - - 30  ALT 0 - 35 U/L - - 16   CBC Latest Ref Rng & Units 09/30/2016 09/29/2016 06/15/2011  WBC 4.0 - 10.5 K/uL 8.2 6.1 5.4  Hemoglobin 12.0 - 15.0 g/dL 11.4(L) 12.3 10.7(L)  Hematocrit 36 - 46 % 33.8(L) 36.7 31.8(L)  Platelets 150 - 400 K/uL 284 287 266   Lipid Panel Recent Labs    04/08/20 1030 04/08/20 1032  CHOL  --  266*  TRIG  --  110  LDLCALC  --  177*  HDL  --  70  LDLDIRECT 180*  --    HEMOGLOBIN A1C Lab Results  Component Value Date   HGBA1C 5.5 09/30/2016   MPG 111 09/30/2016   TSH Recent Labs    04/08/20 1029  TSH 4.590*    Labs 03/24/2019:   Cholesterol, total 278.000 M 03/24/2019  HDL  82.000 M 03/24/2019 LDL 78.000 mg 03/16/2018 Triglycerides 81.000 M 03/24/2019, non-HDL 196. A1C 5.600 % of 03/17/2019; TSH 5.660 03/24/2019    Hemoglobin 12.500 G/ 03/24/2019 Platelets 344.000 X 03/24/2019 Creatinine, Serum 0.910 MG/ 03/24/2019 Potassium 4.700 mm 03/16/2018 ALT (SGPT) 23.000 IU/ 03/24/2019  Medications and allergies   Allergies  Allergen Reactions  . Zofran Other (See Comments)    zofran causes drop in blood pressure  . Codeine Nausea Only  . Other Other (See Comments)    OPIATES & CODEINE CAUSE GI UPSET     Current Outpatient Medications  Medication Instructions  . ALPRAZolam (XANAX) 1 MG tablet 1 tablet, Oral, Daily at bedtime  . amitriptyline (ELAVIL) 25 MG tablet 1 tablet, Oral, Daily  . aspirin 81 mg, Oral, Daily  . atorvastatin (LIPITOR) 10 mg, Oral, Daily  . Bempedoic Acid-Ezetimibe (NEXLIZET) 180-10 MG TABS 1 tablet, Oral, Daily  . calcium carbonate (CALCIUM 600) 600 mg, Oral, Daily  . MAGNESIUM CITRATE PO Oral, Daily  . Omega-3 1400 MG CAPS Oral, Daily  . TURMERIC PO 80 mg, Oral, Daily  . VITAMIN D PO Oral, Daily  . Vitamin D 2,500 Units, Oral, Daily   Radiology:  Aortic atherosclerosis (I70.0)  CT of the chest without contrast 11/10/2016 [facility]):  Mild to moderate coronary artery calcification, normal thoracic aorta, mild calcification of the aortic valve and aortic root. Fibrotic changes in both upper lobes likely related to prior radiation therapy.   VQ Scan at [facility] 11/10/2016: Negative for PE.  Cardiac Studies:   Coronary angiogram 11/25/28:ZSWFUXN LV systolic function, EF 23%. Left dominant circulation. Mild disease in the ramus and LAD. Circumflex midsegment 90% stenosis by intravascular ultrasound. Successful stenting with 4.0 x 18 mm Onyx DES.  Treadmill exercise stress test 10/23/2016: Indication: CP and SoB Resting EKG demonstrates NSR. The patient exercised according to Bruce Protocol, Total time recorded 7:59 min achieving  max heart rate of 136 which was 85% of THR for age and 10.11 METS of work.  Stress terminated due to dyspnea, lightheadedness and THR (>85% MPHR)/MPHR met. Normal BP response.  Patient developed LBBB with peak exercise which reversed to normal at 2 minutes into recovery. Post recovery of LBBB EKG shows 1 mm ST depression with T inversion in high lateral and lateral leads. Normal BP response. Rec: Equivocal stress test for ischemia with development of exercise induced LBBB. Clinical correlation recommended.  Carotid artery duplex  12/27/2018: No significant stenosis in the right carotid artery. Stenosis in the left internal carotid artery (16-49%). Antegrade right vertebral artery flow. Antegrade left vertebral artery flow. Compared to 02/01/2018, left carotid stenosis severity was previously > 50%. Follow up in one year is appropriate if clinically indicated.  Event monitor 07/21/2019: Normal sinus rhythm.  No significant arrhythmias.  Preliminary report. No significant change from  06/18/17 (Occasional PVC and PAC.  Lexiscan (Walking with Jaci Carrel) Sestamibi Stress Test 07/17/2019: Non-diagnostic ECG stress. Resting EKG/ECG demonstrated normal sinus rhythm with left bundle branch block. Peak EKG/ECG revealed no significant ST-T change from baseline abnormality. There is a fixed mild defect in the septal and apical regions. This defects can be present in LBBB. Also there is mild soft tissue attenuation in this region. Stress LV EF: 58%.  No wall motion abnormality.  Low risk study. No previous exam available for comparison.  Echocardiogram 07/21/2019:  Left ventricle cavity is normal in size. Moderate concentric hypertrophy of the left ventricle. Normal LV systolic function with EF 66%. Normal global wall motion. Indeterminate diastolic filling pattern. Trileaflet aortic valve. Mild (Grade I) aortic regurgitation.  Moderate (Grade II) mitral regurgitation.  Mild tricuspid regurgitation.  Estimated pulmonary artery systolic pressure is 22 mmHg.  No significant change compared to previous study in 2018.   Lower extremity venous duplex 04/05/2020: LEFT: - There is no evidence of  deep vein thrombosis in the lower extremity. - There is no evidence of superficial venous thrombosis.  EKG:    EKG 04/11/2020: Normal sinus rhythm at rate of 78 bpm, left bundle branch block.  No significant change from 07/12/2019.  Assessment     ICD-10-CM   1. Coronary artery disease of native artery of native heart with stable angina pectoris (HCC)  I25.118 EKG 12-Lead    atorvastatin (LIPITOR) 10 MG tablet    Bempedoic Acid-Ezetimibe (NEXLIZET) 180-10 MG TABS    Lipid Panel With LDL/HDL Ratio    Lipid Panel With LDL/HDL Ratio  2. Dyspnea on exertion  R06.00   3. LBBB (left bundle branch block)  I44.7   4. Mitral valve prolapse  I34.1   5. Asymptomatic stenosis of left carotid artery without infarction  I65.22 PCV CAROTID DUPLEX (BILATERAL)  6. Hypercholesteremia  E78.00 atorvastatin (LIPITOR) 10 MG tablet    Bempedoic Acid-Ezetimibe (NEXLIZET) 180-10 MG TABS    Lipid Panel With LDL/HDL Ratio    Lipid Panel With LDL/HDL Ratio   Meds ordered this encounter  Medications  . atorvastatin (LIPITOR) 10 MG tablet    Sig: Take 1 tablet (10 mg total) by mouth daily.    Dispense:  30 tablet    Refill:  2  . Bempedoic Acid-Ezetimibe (NEXLIZET) 180-10 MG TABS    Sig: Take 1 tablet by mouth daily.    Dispense:  30 tablet    Refill:  2    Medications Discontinued During This Encounter  Medication Reason  . pravastatin (PRAVACHOL) 20 MG tablet Patient Preference  . propranolol (INDERAL) 20 MG tablet Patient Preference  . REXULTI 1 MG TABS tablet Patient Preference  . buPROPion (WELLBUTRIN XL) 150 MG 24 hr tablet Patient Preference    Recommendations:   Cynthia Poole  is a 64 y.o. Caucasian female with hyperlipidemia, coronary artery disease status post PCI to the dominant circumflex in 2018,  LBBB, history of Hodgkin's lymphoma 1997 status post chemotherapy and radiation therapy to chest presents for f/u.  Patient is here on a 71-monthoffice visit on follow-up, she is fortunately has not had any recurrence of angina, dyspnea has remained stable and there is no clinical evidence of heart failure.  No change in EKG and left bundle branch block.  I reviewed the results of the lipid profile testing, patient is very reluctant to taking any medications.  After long discussion I have started her on atorvastatin 10 mg as she has myalgias on higher dose and also added Nexlizet   180/10 mg daily and I will recheck lipids in 6 to 8 weeks and see her back in 8 to 10 weeks.  With regard to carotid artery stenosis she needs surveillance Dopplers.  I will set this up as well.  This is a long discussion regarding changes needed to be done to her medications, she is presently not on a beta-blocker or an ACE inhibitor and again patient states that her blood pressure is well controlled and she does not want to be on any medications.  Palpitation symptoms are very minimal.  She does have mitral prolapse and mild to moderate mitral regurgitation this does not appear to change on physical exam.   JAdrian Prows MD, [facility]10/21/2021, 10:26 AM Office: [phone]: [phone]

## 2020-04-12 ENCOUNTER — Other Ambulatory Visit: Payer: Self-pay

## 2020-04-12 ENCOUNTER — Telehealth: Payer: Self-pay

## 2020-04-12 DIAGNOSIS — E7800 Pure hypercholesterolemia, unspecified: Secondary | ICD-10-CM

## 2020-04-12 DIAGNOSIS — I25118 Atherosclerotic heart disease of native coronary artery with other forms of angina pectoris: Secondary | ICD-10-CM

## 2020-04-12 MED ORDER — NEXLIZET 180-10 MG PO TABS: 1.0000 | ORAL_TABLET | Freq: Every day | ORAL | 2 refills | Status: DC

## 2020-04-12 NOTE — Telephone Encounter (Signed)
Nexlezet approved sent to pt's ph[facility]armacy

## 2020-04-15 DIAGNOSIS — F341 Dysthymic disorder: Secondary | ICD-10-CM | POA: Diagnosis not present

## 2020-04-15 DIAGNOSIS — F331 Major depressive disorder, recurrent, moderate: Secondary | ICD-10-CM | POA: Diagnosis not present

## 2020-04-17 DIAGNOSIS — Z1231 Encounter for screening mammogram for malignant neoplasm of breast: Secondary | ICD-10-CM | POA: Diagnosis not present

## 2020-04-17 DIAGNOSIS — Z01419 Encounter for gynecological examination (general) (routine) without abnormal findings: Secondary | ICD-10-CM | POA: Diagnosis not present

## 2020-04-18 ENCOUNTER — Other Ambulatory Visit: Payer: BC Managed Care – PPO

## 2020-04-24 ENCOUNTER — Other Ambulatory Visit: Payer: BC Managed Care – PPO

## 2020-04-26 DIAGNOSIS — F411 Generalized anxiety disorder: Secondary | ICD-10-CM | POA: Diagnosis not present

## 2020-04-26 DIAGNOSIS — F330 Major depressive disorder, recurrent, mild: Secondary | ICD-10-CM | POA: Diagnosis not present

## 2020-04-29 DIAGNOSIS — F341 Dysthymic disorder: Secondary | ICD-10-CM | POA: Diagnosis not present

## 2020-04-29 DIAGNOSIS — F331 Major depressive disorder, recurrent, moderate: Secondary | ICD-10-CM | POA: Diagnosis not present

## 2020-04-30 ENCOUNTER — Other Ambulatory Visit: Payer: Self-pay

## 2020-04-30 ENCOUNTER — Ambulatory Visit: Payer: BC Managed Care – PPO

## 2020-04-30 DIAGNOSIS — I6522 Occlusion and stenosis of left carotid artery: Secondary | ICD-10-CM | POA: Diagnosis not present

## 2020-05-06 DIAGNOSIS — F331 Major depressive disorder, recurrent, moderate: Secondary | ICD-10-CM | POA: Diagnosis not present

## 2020-05-06 DIAGNOSIS — F341 Dysthymic disorder: Secondary | ICD-10-CM | POA: Diagnosis not present

## 2020-05-07 ENCOUNTER — Telehealth: Payer: Self-pay

## 2020-05-07 NOTE — Telephone Encounter (Signed)
Patient called stating that the atorvastatin you prescribed her is causing her to have severe leg cramps and she wants to know if she can go back to the Pravastatin that she had before, since she still has a full bottle left? Please advise.

## 2020-05-07 NOTE — Telephone Encounter (Signed)
Patient is aware, and she said that she will try to do 40mg , the bottle she has is Pravastatin 20mg .

## 2020-05-07 NOTE — Telephone Encounter (Signed)
Sure but 40 mg of pravastatin daily in evening

## 2020-05-13 DIAGNOSIS — F331 Major depressive disorder, recurrent, moderate: Secondary | ICD-10-CM | POA: Diagnosis not present

## 2020-05-13 DIAGNOSIS — F341 Dysthymic disorder: Secondary | ICD-10-CM | POA: Diagnosis not present

## 2020-05-20 DIAGNOSIS — F331 Major depressive disorder, recurrent, moderate: Secondary | ICD-10-CM | POA: Diagnosis not present

## 2020-05-20 DIAGNOSIS — F341 Dysthymic disorder: Secondary | ICD-10-CM | POA: Diagnosis not present

## 2020-05-27 DIAGNOSIS — F341 Dysthymic disorder: Secondary | ICD-10-CM | POA: Diagnosis not present

## 2020-05-27 DIAGNOSIS — F331 Major depressive disorder, recurrent, moderate: Secondary | ICD-10-CM | POA: Diagnosis not present

## 2020-06-03 DIAGNOSIS — F331 Major depressive disorder, recurrent, moderate: Secondary | ICD-10-CM | POA: Diagnosis not present

## 2020-06-03 DIAGNOSIS — F341 Dysthymic disorder: Secondary | ICD-10-CM | POA: Diagnosis not present

## 2020-06-05 DIAGNOSIS — F330 Major depressive disorder, recurrent, mild: Secondary | ICD-10-CM | POA: Diagnosis not present

## 2020-06-05 DIAGNOSIS — F411 Generalized anxiety disorder: Secondary | ICD-10-CM | POA: Diagnosis not present

## 2020-06-10 DIAGNOSIS — F331 Major depressive disorder, recurrent, moderate: Secondary | ICD-10-CM | POA: Diagnosis not present

## 2020-06-10 DIAGNOSIS — F341 Dysthymic disorder: Secondary | ICD-10-CM | POA: Diagnosis not present

## 2020-06-13 DIAGNOSIS — I25118 Atherosclerotic heart disease of native coronary artery with other forms of angina pectoris: Secondary | ICD-10-CM | POA: Diagnosis not present

## 2020-06-13 DIAGNOSIS — E7800 Pure hypercholesterolemia, unspecified: Secondary | ICD-10-CM | POA: Diagnosis not present

## 2020-06-14 LAB — LIPID PANEL WITH LDL/HDL RATIO
Cholesterol, Total: 241 mg/dL — ABNORMAL HIGH (ref 100–199)
HDL: 82 mg/dL (ref >39)
LDL Chol Calc (NIH): 146 mg/dL — ABNORMAL HIGH (ref 0–99)
LDL/HDL Ratio: 1.8 ratio (ref 0.0–3.2)
Triglycerides: 75 mg/dL (ref 0–149)
VLDL Cholesterol Cal: 13 mg/dL (ref 5–40)

## 2020-06-20 ENCOUNTER — Ambulatory Visit: Payer: BC Managed Care – PPO | Admitting: Cardiology

## 2020-06-20 DIAGNOSIS — M9903 Segmental and somatic dysfunction of lumbar region: Secondary | ICD-10-CM | POA: Diagnosis not present

## 2020-06-20 DIAGNOSIS — M9901 Segmental and somatic dysfunction of cervical region: Secondary | ICD-10-CM | POA: Diagnosis not present

## 2020-06-20 DIAGNOSIS — M5431 Sciatica, right side: Secondary | ICD-10-CM | POA: Diagnosis not present

## 2020-06-24 ENCOUNTER — Ambulatory Visit: Payer: BC Managed Care – PPO | Admitting: Cardiology

## 2020-06-24 DIAGNOSIS — F331 Major depressive disorder, recurrent, moderate: Secondary | ICD-10-CM | POA: Diagnosis not present

## 2020-06-24 DIAGNOSIS — F341 Dysthymic disorder: Secondary | ICD-10-CM | POA: Diagnosis not present

## 2020-06-27 DIAGNOSIS — M9901 Segmental and somatic dysfunction of cervical region: Secondary | ICD-10-CM | POA: Diagnosis not present

## 2020-06-27 DIAGNOSIS — M9903 Segmental and somatic dysfunction of lumbar region: Secondary | ICD-10-CM | POA: Diagnosis not present

## 2020-06-27 DIAGNOSIS — M5431 Sciatica, right side: Secondary | ICD-10-CM | POA: Diagnosis not present

## 2020-07-01 DIAGNOSIS — F331 Major depressive disorder, recurrent, moderate: Secondary | ICD-10-CM | POA: Diagnosis not present

## 2020-07-11 DIAGNOSIS — M9903 Segmental and somatic dysfunction of lumbar region: Secondary | ICD-10-CM | POA: Diagnosis not present

## 2020-07-11 DIAGNOSIS — M9901 Segmental and somatic dysfunction of cervical region: Secondary | ICD-10-CM | POA: Diagnosis not present

## 2020-07-11 DIAGNOSIS — M5431 Sciatica, right side: Secondary | ICD-10-CM | POA: Diagnosis not present

## 2020-07-15 DIAGNOSIS — F341 Dysthymic disorder: Secondary | ICD-10-CM | POA: Diagnosis not present

## 2020-07-15 DIAGNOSIS — F331 Major depressive disorder, recurrent, moderate: Secondary | ICD-10-CM | POA: Diagnosis not present

## 2020-07-22 DIAGNOSIS — F341 Dysthymic disorder: Secondary | ICD-10-CM | POA: Diagnosis not present

## 2020-07-22 DIAGNOSIS — F331 Major depressive disorder, recurrent, moderate: Secondary | ICD-10-CM | POA: Diagnosis not present

## 2020-07-24 DIAGNOSIS — M9901 Segmental and somatic dysfunction of cervical region: Secondary | ICD-10-CM | POA: Diagnosis not present

## 2020-07-24 DIAGNOSIS — M9903 Segmental and somatic dysfunction of lumbar region: Secondary | ICD-10-CM | POA: Diagnosis not present

## 2020-07-24 DIAGNOSIS — M5431 Sciatica, right side: Secondary | ICD-10-CM | POA: Diagnosis not present

## 2020-07-29 DIAGNOSIS — F331 Major depressive disorder, recurrent, moderate: Secondary | ICD-10-CM | POA: Diagnosis not present

## 2020-08-01 DIAGNOSIS — M5431 Sciatica, right side: Secondary | ICD-10-CM | POA: Diagnosis not present

## 2020-08-01 DIAGNOSIS — M9903 Segmental and somatic dysfunction of lumbar region: Secondary | ICD-10-CM | POA: Diagnosis not present

## 2020-08-01 DIAGNOSIS — M9901 Segmental and somatic dysfunction of cervical region: Secondary | ICD-10-CM | POA: Diagnosis not present

## 2020-08-12 DIAGNOSIS — F341 Dysthymic disorder: Secondary | ICD-10-CM | POA: Diagnosis not present

## 2020-08-12 DIAGNOSIS — F331 Major depressive disorder, recurrent, moderate: Secondary | ICD-10-CM | POA: Diagnosis not present

## 2020-08-14 DIAGNOSIS — C44519 Basal cell carcinoma of skin of other part of trunk: Secondary | ICD-10-CM | POA: Diagnosis not present

## 2020-08-14 DIAGNOSIS — D2272 Melanocytic nevi of left lower limb, including hip: Secondary | ICD-10-CM | POA: Diagnosis not present

## 2020-08-14 DIAGNOSIS — D2261 Melanocytic nevi of right upper limb, including shoulder: Secondary | ICD-10-CM | POA: Diagnosis not present

## 2020-08-14 DIAGNOSIS — Z85828 Personal history of other malignant neoplasm of skin: Secondary | ICD-10-CM | POA: Diagnosis not present

## 2020-08-14 DIAGNOSIS — D2262 Melanocytic nevi of left upper limb, including shoulder: Secondary | ICD-10-CM | POA: Diagnosis not present

## 2020-08-14 DIAGNOSIS — L570 Actinic keratosis: Secondary | ICD-10-CM | POA: Diagnosis not present

## 2020-08-14 DIAGNOSIS — D225 Melanocytic nevi of trunk: Secondary | ICD-10-CM | POA: Diagnosis not present

## 2020-08-14 DIAGNOSIS — D485 Neoplasm of uncertain behavior of skin: Secondary | ICD-10-CM | POA: Diagnosis not present

## 2020-08-26 DIAGNOSIS — F341 Dysthymic disorder: Secondary | ICD-10-CM | POA: Diagnosis not present

## 2020-08-28 DIAGNOSIS — D485 Neoplasm of uncertain behavior of skin: Secondary | ICD-10-CM | POA: Diagnosis not present

## 2020-08-28 DIAGNOSIS — L988 Other specified disorders of the skin and subcutaneous tissue: Secondary | ICD-10-CM | POA: Diagnosis not present

## 2020-09-02 DIAGNOSIS — F331 Major depressive disorder, recurrent, moderate: Secondary | ICD-10-CM | POA: Diagnosis not present

## 2020-09-02 DIAGNOSIS — F341 Dysthymic disorder: Secondary | ICD-10-CM | POA: Diagnosis not present

## 2020-09-09 DIAGNOSIS — F341 Dysthymic disorder: Secondary | ICD-10-CM | POA: Diagnosis not present

## 2020-09-16 DIAGNOSIS — F341 Dysthymic disorder: Secondary | ICD-10-CM | POA: Diagnosis not present

## 2020-09-23 DIAGNOSIS — F341 Dysthymic disorder: Secondary | ICD-10-CM | POA: Diagnosis not present

## 2020-09-30 DIAGNOSIS — F341 Dysthymic disorder: Secondary | ICD-10-CM | POA: Diagnosis not present

## 2020-10-03 DIAGNOSIS — M9901 Segmental and somatic dysfunction of cervical region: Secondary | ICD-10-CM | POA: Diagnosis not present

## 2020-10-03 DIAGNOSIS — M5431 Sciatica, right side: Secondary | ICD-10-CM | POA: Diagnosis not present

## 2020-10-03 DIAGNOSIS — M9903 Segmental and somatic dysfunction of lumbar region: Secondary | ICD-10-CM | POA: Diagnosis not present

## 2020-10-14 DIAGNOSIS — F341 Dysthymic disorder: Secondary | ICD-10-CM | POA: Diagnosis not present

## 2020-10-21 DIAGNOSIS — F331 Major depressive disorder, recurrent, moderate: Secondary | ICD-10-CM | POA: Diagnosis not present

## 2020-10-28 DIAGNOSIS — F331 Major depressive disorder, recurrent, moderate: Secondary | ICD-10-CM | POA: Diagnosis not present

## 2020-10-28 DIAGNOSIS — F341 Dysthymic disorder: Secondary | ICD-10-CM | POA: Diagnosis not present

## 2020-10-29 ENCOUNTER — Other Ambulatory Visit: Payer: Self-pay | Admitting: Cardiology

## 2020-10-29 DIAGNOSIS — I25118 Atherosclerotic heart disease of native coronary artery with other forms of angina pectoris: Secondary | ICD-10-CM

## 2020-11-05 ENCOUNTER — Other Ambulatory Visit: Payer: Self-pay | Admitting: Cardiology

## 2020-11-05 DIAGNOSIS — E559 Vitamin D deficiency, unspecified: Secondary | ICD-10-CM

## 2020-11-05 DIAGNOSIS — E7800 Pure hypercholesterolemia, unspecified: Secondary | ICD-10-CM

## 2020-11-05 DIAGNOSIS — I25118 Atherosclerotic heart disease of native coronary artery with other forms of angina pectoris: Secondary | ICD-10-CM

## 2020-11-05 DIAGNOSIS — R7989 Other specified abnormal findings of blood chemistry: Secondary | ICD-10-CM

## 2020-12-19 ENCOUNTER — Ambulatory Visit: Payer: BC Managed Care – PPO | Admitting: Cardiology

## 2020-12-19 NOTE — Progress Notes (Deleted)
Primary Physician/Referring:  Leanna Battles, MD  Patient ID: Cynthia Poole, female    DOB: 1956-03-13, 65 y.o.   MRN: [id number]  No chief complaint on file.  HPI:    Cynthia Poole  is a 65 y.o. Caucasian female with hyperlipidemia, coronary artery disease status post PCI to the dominant circumflex in 2018, LBBB, history of Hodgkin's lymphoma 1997 status post chemotherapy and radiation therapy to chest presents for annual visit. She made this visit also due to frequent palpations again since yesterday.  She has hyperlipidemia, MVP with mild to moderate MR.  She is here on a 17-monthoffice visit, states that palpitation symptoms are significantly improved and she has discontinued beta-blockers.  She has discontinued statins as a cause myalgias and she is worried about dementia.  Denies chest pain, states that her dyspnea has remained stable.   Past Medical History:  Diagnosis Date   Anemia    CAD in native artery 09/29/2016   Coronary angiogram 09/29/16: Dynamic LV systolic function, EF 656% Left dominant circulation. Mild disease in the ramus and LAD. Circumflex midsegment  90% by IVUS S/P stenting 4.0 x 18 mm Onyx DES.   DDD (degenerative disc disease), cervical    DDD (degenerative disc disease), lumbar    Depression    Fibromyalgia 03/2004   GERD (gastroesophageal reflux disease)    Heart murmur    History of pleurisy    HLD (hyperlipidemia)    Hodgkin's disease 1997    S/P Chemo, Chest RT and splenectomy   Hypertension    pt denies   Hypothyroidism    no longer per pt    Insomnia    Internal and external bleeding hemorrhoids    Migraines    MVP (mitral valve prolapse)    Osteopenia    Personal history of colonic adenoma 06/29/2011   06/29/2011 - diminutive adenoma   Seasonal allergies    Shingles 2018   Vitamin D deficiency    Past Surgical History:  Procedure Laterality Date   BUNIONECTOMY     CARDIAC CATHETERIZATION  09/29/2016   COLONOSCOPY  09/02/2007   redundant  colon, external hemorrhoids   COLONOSCOPY  06/29/2011   diminutive polyp, diverticulosis, external hemorrhoids   CORONARY STENT INTERVENTION N/A 09/29/2016   Procedure: Coronary Stent Intervention;  Surgeon: JAdrian Prows MD;  [facility];  Service: Cardiovascular;  Laterality: N/A;   DEEP NECK LYMPH NODE BIOPSY / EXCISION  1996   right LN    ESOPHAGOGASTRODUODENOSCOPY  06/29/2011   notrmal, 54 Fr dilation (dysphagia)   INTRAVASCULAR ULTRASOUND/IVUS N/A 09/29/2016   Procedure: Intravascular Ultrasound/IVUS;  Surgeon: JAdrian Prows MD;  [facility];  Service: Cardiovascular;  Laterality: N/A;   LEFT HEART CATH AND CORONARY ANGIOGRAPHY N/A 09/29/2016   Procedure: Left Heart Cath and Coronary Angiography;  Surgeon: JAdrian Prows MD;  [facility];  Service: Cardiovascular;  Laterality: N/A;   SPLENECTOMY, TOTAL  1996   STRABISMUS SURGERY  02/2009   left   TONSILLECTOMY     age [age]  Social History   Tobacco Use   Smoking status: Never   Smokeless tobacco: Never  Substance Use Topics   Alcohol use: Yes    Alcohol/week: 7.0 standard drinks    Types: 7 Glasses of wine per week    Comment: occasionally  Marital Status: Divorced   ROS  Review of Systems  Cardiovascular:  Positive for dyspnea on exertion and palpitations. Negative for chest  pain and leg swelling.  Gastrointestinal:  Negative for melena.  Neurological:  Positive for dizziness.  Objective  There were no vitals taken for this visit.  Vitals with BMI 04/11/2020 08/28/2019 07/12/2019  Height _0  _1  -  Weight 157 lbs 151 lbs 13 oz -  BMI 73.53 29.92 -  Systolic 426 834 196  Diastolic 63 66 84  Pulse 82 60 -    No data found.   Physical Exam Cardiovascular:     Rate and Rhythm: Normal rate and regular rhythm.     Pulses: Normal pulses and intact distal pulses.          Carotid pulses are  on the right side with bruit.    Heart sounds: A midsystolic click. Murmur heard.   Crescendo-decrescendo mid to late systolic murmur is present with a grade of 2/6 at the apex.    No gallop.     Comments: No leg edema, no JVD. Pulmonary:     Effort: Pulmonary effort is normal.     Breath sounds: Normal breath sounds.  Abdominal:     General: Bowel sounds are normal.     Palpations: Abdomen is soft.   Laboratory examination:   No results for input(s): NA, K, CL, CO2, GLUCOSE, BUN, CREATININE, CALCIUM, GFRNONAA, GFRAA in the last 8760 hours. CrCl cannot be calculated (Patient's most recent lab result is older than the maximum 21 days allowed.).  CMP Latest Ref Rng & Units 09/30/2016 09/29/2016 06/15/2011  Glucose 65 - 99 mg/dL 109(H) 168(H) 84  BUN 6 - 20 mg/dL _2 Creatinine 0.44 - 1.00 mg/dL 0.84 0.85 0.73  Sodium 135 - 145 mmol/L 136 135 137  Potassium 3.5 - 5.1 mmol/L 5.4(H) 3.9 4.4  Chloride 101 - 111 mmol/L 103 101 109  CO2 22 - 32 mmol/L _3 Calcium 8.9 - 10.3 mg/dL 9.5 9.2 7.3(L)  Total Protein 6.0 - 8.3 g/dL - - 5.0(L)  Total Bilirubin 0.3 - 1.2 mg/dL - - 0.2(L)  Alkaline Phos 39 - 117 U/L - - 27(L)  AST 0 - 37 U/L - - 30  ALT 0 - 35 U/L - - 16   CBC Latest Ref Rng & Units 09/30/2016 09/29/2016 06/15/2011  WBC 4.0 - 10.5 K/uL 8.2 6.1 5.4  Hemoglobin 12.0 - 15.0 g/dL 11.4(L) 12.3 10.7(L)  Hematocrit 36.0 - 46.0 % 33.8(L) 36.7 31.8(L)  Platelets 150 - 400 K/uL 284 287 266   Lipid Panel Recent Labs    04/08/20 1030 04/08/20 1032 06/13/20 0944  CHOL  --  266* 241*  TRIG  --  110 75  LDLCALC  --  177* 146*  HDL  --  70 82  LDLDIRECT 180*  --   --    HEMOGLOBIN A1C Lab Results  Component Value Date   HGBA1C 5.5 09/30/2016   MPG 111 09/30/2016   TSH Recent Labs    04/08/20 1029  TSH 4.590*   Labs 03/24/2019:   Cholesterol, total 278.000 M 03/24/2019  HDL 82.000 M 03/24/2019 LDL 78.000 mg 03/16/2018 Triglycerides 81.000 M 03/24/2019, non-HDL 196. A1C 5.600 % of 03/17/2019; TSH 5.660 03/24/2019    Hemoglobin 12.500 G/ 03/24/2019  Platelets 344.000 X 03/24/2019 Creatinine, Serum 0.910 MG/ 03/24/2019 Potassium 4.700 mm 03/16/2018 ALT (SGPT) 23.000 IU/ 03/24/2019  Medications and allergies   Allergies  Allergen Reactions   Zofran Other (See Comments)    zofran causes drop in blood pressure   Codeine Nausea Only  Other Other (See Comments)    OPIATES & CODEINE CAUSE GI UPSET     Current Outpatient Medications  Medication Instructions   ALPRAZolam (XANAX) 1 MG tablet 1 tablet, Oral, Daily at bedtime   amitriptyline (ELAVIL) 25 MG tablet 1 tablet, Oral, Daily   aspirin 81 mg, Oral, Daily   atorvastatin (LIPITOR) 10 mg, Oral, Daily   Bempedoic Acid-Ezetimibe (NEXLIZET) 180-10 MG TABS 1 tablet, Oral, Daily   calcium carbonate (CALCIUM 600) 600 mg, Oral, Daily   MAGNESIUM CITRATE PO Oral, Daily   Omega-3 1400 MG CAPS Oral, Daily   pravastatin (PRAVACHOL) 20 MG tablet TAKE 1 TABLET(20 MG) BY MOUTH EVERY EVENING   TURMERIC PO 80 mg, Oral, Daily   VITAMIN D PO Oral, Daily   Vitamin D 2,500 Units, Oral, Daily   Radiology:  Aortic atherosclerosis (I70.0)   CT of the chest without contrast 11/10/2016 [facility]):  Mild to moderate coronary artery calcification, normal thoracic aorta, mild calcification of the aortic valve and aortic root. Fibrotic changes in both upper lobes likely related to prior radiation therapy.   VQ Scan at [facility] 11/10/2016: Negative for PE.  Cardiac Studies:   Coronary angiogram 09/29/16: Dynamic LV systolic function, EF 83%. Left dominant circulation. Mild disease in the ramus and LAD. Circumflex midsegment 90% stenosis by intravascular ultrasound. Successful stenting with 4.0 x 18 mm Onyx DES.  Treadmill exercise stress test 10/23/2016: Indication: CP and SoB Resting EKG demonstrates NSR. The patient exercised according to Bruce Protocol, Total time recorded 7:59 min achieving max heart rate of 136 which was 85% of THR for age and 10.11 METS of work.  Stress terminated due to  dyspnea, lightheadedness and THR (>85% MPHR)/MPHR met. Normal BP response.  Patient developed LBBB with peak exercise which reversed to normal at 2 minutes into recovery. Post recovery of LBBB EKG shows 1 mm ST depression with T inversion in high lateral and lateral leads. Normal BP response. Rec: Equivocal stress test for ischemia with development of exercise induced LBBB. Clinical correlation recommended.  Event monitor 07/21/2019: Normal sinus rhythm.  No significant arrhythmias.  Preliminary report. No significant change from  06/18/17 (Occasional PVC and PAC.  Lexiscan (Walking with Jaci Carrel) Sestamibi Stress Test 07/17/2019: Non-diagnostic ECG stress. Resting EKG/ECG demonstrated normal sinus rhythm with left bundle branch block. Peak EKG/ECG revealed no significant ST-T change from baseline abnormality. There is a fixed mild defect in the septal and apical regions. This defects can be present in LBBB. Also there is mild soft tissue attenuation in this region. Stress LV EF: 58%.  No wall motion abnormality.  Low risk study. No previous exam available for comparison.  Echocardiogram 07/21/2019:  Left ventricle cavity is normal in size. Moderate concentric hypertrophy of the left ventricle. Normal LV systolic function with EF 66%. Normal global wall motion. Indeterminate diastolic filling pattern. Trileaflet aortic valve.  Mild (Grade I) aortic regurgitation.  Moderate (Grade II) mitral regurgitation.  Mild tricuspid regurgitation. Estimated pulmonary artery systolic pressure is 22 mmHg.  No significant change compared to previous study in 2018.   Lower extremity venous duplex 04/05/2020: LEFT: - There is no evidence of deep vein thrombosis in the lower extremity. - There is no evidence of superficial venous thrombosis.  Carotid artery duplex 05/02/2020: Stenosis in the right external carotid artery (<50%). Stenosis in the left internal carotid artery (1-15%). Stenosis in the left external  carotid artery (<50%). There is mild heterogeneous plaque noted in bilateral carotid arteries. Antegrade right vertebral artery  flow. Antegrade left vertebral artery flow. Follow up studies appropriate if clinically indicated. Compared to 12/27/2018, left ICA stenosis of 15-49% appears minimal now.  EKG:    EKG 04/11/2020: Normal sinus rhythm at rate of 78 bpm, left bundle branch block.  No significant change from 07/12/2019.  Assessment     ICD-10-CM   1. Coronary artery disease of native artery of native heart with stable angina pectoris ([place])  I25.118     2. Hypercholesteremia  E78.00     3. LBBB (left bundle branch block)  I44.7     4. Mitral valve prolapse  I34.1      No orders of the defined types were placed in this encounter.   There are no discontinued medications.   Recommendations:   Cynthia Poole  is a 65 y.o. Caucasian female with hyperlipidemia, coronary artery disease status post PCI to the dominant circumflex in 2018, LBBB, history of Hodgkin's lymphoma 1997 status post chemotherapy and radiation therapy to chest presents for f/u.  Patient is here on a 34-monthoffice visit on follow-up, she is fortunately has not had any recurrence of angina, dyspnea has remained stable and there is no clinical evidence of heart failure.  No change in EKG and left bundle branch block.  I reviewed the results of the lipid profile testing, patient is very reluctant to taking any medications.  After long discussion I have started her on atorvastatin 10 mg as she has myalgias on higher dose and also added Nexlizet   180/10 mg daily and I will recheck lipids in 6 to 8 weeks and see her back in 8 to 10 weeks.  With regard to carotid artery stenosis she needs surveillance Dopplers.  I will set this up as well.  This is a long discussion regarding changes needed to be done to her medications, she is presently not on a beta-blocker or an ACE inhibitor and again patient states that her blood  pressure is well controlled and she does not want to be on any medications.  Palpitation symptoms are very minimal.  She does have mitral prolapse and mild to moderate mitral regurgitation this does not appear to change on physical exam.   JAdrian Prows MD, [facility]6/30/2022, 6:31 AM Office: [phone]: [phone]

## 2020-12-29 ENCOUNTER — Other Ambulatory Visit: Payer: Self-pay | Admitting: Cardiology

## 2020-12-29 DIAGNOSIS — I25118 Atherosclerotic heart disease of native coronary artery with other forms of angina pectoris: Secondary | ICD-10-CM

## 2021-01-15 ENCOUNTER — Ambulatory Visit: Payer: Medicare Other | Admitting: Cardiology

## 2021-01-15 ENCOUNTER — Encounter: Payer: Self-pay | Admitting: Cardiology

## 2021-01-15 ENCOUNTER — Other Ambulatory Visit: Payer: Self-pay

## 2021-01-15 VITALS — BP 135/72 | HR 80 | Temp 98.6°F | Resp 17 | Ht 66.0 in | Wt 161.0 lb

## 2021-01-15 DIAGNOSIS — I340 Nonrheumatic mitral (valve) insufficiency: Secondary | ICD-10-CM

## 2021-01-15 DIAGNOSIS — I447 Left bundle-branch block, unspecified: Secondary | ICD-10-CM

## 2021-01-15 DIAGNOSIS — R0600 Dyspnea, unspecified: Secondary | ICD-10-CM

## 2021-01-15 DIAGNOSIS — I25118 Atherosclerotic heart disease of native coronary artery with other forms of angina pectoris: Secondary | ICD-10-CM

## 2021-01-15 DIAGNOSIS — R0609 Other forms of dyspnea: Secondary | ICD-10-CM

## 2021-01-15 DIAGNOSIS — E7800 Pure hypercholesterolemia, unspecified: Secondary | ICD-10-CM

## 2021-01-15 DIAGNOSIS — I6522 Occlusion and stenosis of left carotid artery: Secondary | ICD-10-CM

## 2021-01-15 LAB — CMP14+EGFR
ALT: 14 IU/L (ref 0–32)
AST: 22 IU/L (ref 0–40)
Albumin/Globulin Ratio: 1.8 (ref 1.2–2.2)
Albumin: 4.4 g/dL (ref 3.8–4.8)
Alkaline Phosphatase: 81 IU/L (ref 44–121)
BUN/Creatinine Ratio: 12 (ref 12–28)
BUN: 12 mg/dL (ref 8–27)
Bilirubin Total: 0.3 mg/dL (ref 0.0–1.2)
CO2: 25 mmol/L (ref 20–29)
Calcium: 9.2 mg/dL (ref 8.7–10.3)
Chloride: 98 mmol/L (ref 96–106)
Creatinine, Ser: 0.97 mg/dL (ref 0.57–1.00)
Globulin, Total: 2.4 g/dL (ref 1.5–4.5)
Glucose: 92 mg/dL (ref 65–99)
Potassium: 4.7 mmol/L (ref 3.5–5.2)
Sodium: 134 mmol/L (ref 134–144)
Total Protein: 6.8 g/dL (ref 6.0–8.5)
eGFR: 65 mL/min/{1.73_m2} (ref >59)

## 2021-01-15 LAB — LDL CHOLESTEROL, DIRECT: LDL Direct: 137 mg/dL — ABNORMAL HIGH (ref 0–99)

## 2021-01-15 LAB — LIPID PANEL WITH LDL/HDL RATIO
Cholesterol, Total: 219 mg/dL — ABNORMAL HIGH (ref 100–199)
HDL: 65 mg/dL (ref >39)
LDL Chol Calc (NIH): 137 mg/dL — ABNORMAL HIGH (ref 0–99)
LDL/HDL Ratio: 2.1 ratio (ref 0.0–3.2)
Triglycerides: 99 mg/dL (ref 0–149)
VLDL Cholesterol Cal: 17 mg/dL (ref 5–40)

## 2021-01-15 LAB — TSH+T4F+T3FREE
Free T4: 0.75 ng/dL — ABNORMAL LOW (ref 0.82–1.77)
T3, Free: 2 pg/mL (ref 2.0–4.4)
TSH: 3.67 u[IU]/mL (ref 0.450–4.500)

## 2021-01-15 LAB — VITAMIN D 25 HYDROXY (VIT D DEFICIENCY, FRACTURES): Vit D, 25-Hydroxy: 28.9 ng/mL — ABNORMAL LOW (ref 30.0–100.0)

## 2021-01-15 MED ORDER — REPATHA SURECLICK 140 MG/ML ~~LOC~~ SOAJ: 1.0000 "pen " | SUBCUTANEOUS | 3 refills | Status: DC

## 2021-01-15 MED ORDER — EVOLOCUMAB 140 MG/ML ~~LOC~~ SOAJ
140.0000 mg | Freq: Once | SUBCUTANEOUS | Status: AC
  Administered 2021-01-15: 140 mg via SUBCUTANEOUS

## 2021-01-15 NOTE — Progress Notes (Signed)
Primary Physician/Referring:  Leanna Battles, MD  Patient ID: Cynthia Poole, female    DOB: 02-Sep-1955, 65 y.o.   MRN: [id number]  Chief Complaint  Patient presents with   Coronary Artery Disease   carotid stenosis   Follow-up   HPI:    Cynthia Poole  is a 65 y.o. Caucasian female with hyperlipidemia, coronary artery disease status post PCI to the dominant circumflex in 2018, LBBB, history of Hodgkin's lymphoma 1997 status post chemotherapy and radiation therapy to chest presents for annual f/u.  She continues to have dyspnea on exertion, fatigue and occasional palpitations.  No chest pain, syncope.  Past Medical History:  Diagnosis Date   Anemia    CAD in native artery 09/29/2016   Coronary angiogram 09/29/16: Dynamic LV systolic function, EF 59%. Left dominant circulation. Mild disease in the ramus and LAD. Circumflex midsegment  90% by IVUS S/P stenting 4.0 x 18 mm Onyx DES.   DDD (degenerative disc disease), cervical    DDD (degenerative disc disease), lumbar    Depression    Fibromyalgia 03/2004   GERD (gastroesophageal reflux disease)    Heart murmur    History of pleurisy    HLD (hyperlipidemia)    Hodgkin's disease 1997    S/P Chemo, Chest RT and splenectomy   Hypertension    pt denies   Hypothyroidism    no longer per pt    Insomnia    Internal and external bleeding hemorrhoids    Migraines    MVP (mitral valve prolapse)    Osteopenia    Personal history of colonic adenoma 06/29/2011   06/29/2011 - diminutive adenoma   Seasonal allergies    Shingles 2018   Vitamin D deficiency    Past Surgical History:  Procedure Laterality Date   BUNIONECTOMY     CARDIAC CATHETERIZATION  09/29/2016   COLONOSCOPY  09/02/2007   redundant colon, external hemorrhoids   COLONOSCOPY  06/29/2011   diminutive polyp, diverticulosis, external hemorrhoids   CORONARY STENT INTERVENTION N/A 09/29/2016   Procedure: Coronary Stent Intervention;  Surgeon: Adrian Prows, MD;  Location: [facility];  Service: Cardiovascular;  Laterality: N/A;   DEEP NECK LYMPH NODE BIOPSY / EXCISION  1996   right LN    ESOPHAGOGASTRODUODENOSCOPY  06/29/2011   notrmal, 54 Fr dilation (dysphagia)   INTRAVASCULAR ULTRASOUND/IVUS N/A 09/29/2016   Procedure: Intravascular Ultrasound/IVUS;  Surgeon: Adrian Prows, MD;  Location: [facility];  Service: Cardiovascular;  Laterality: N/A;   LEFT HEART CATH AND CORONARY ANGIOGRAPHY N/A 09/29/2016   Procedure: Left Heart Cath and Coronary Angiography;  Surgeon: Adrian Prows, MD;  Location: [facility];  Service: Cardiovascular;  Laterality: N/A;   SPLENECTOMY, TOTAL  1996   STRABISMUS SURGERY  02/2009   left   TONSILLECTOMY     age [age]   Social History   Tobacco Use   Smoking status: Never   Smokeless tobacco: Never  Substance Use Topics   Alcohol use: Yes    Alcohol/week: 7.0 standard drinks    Types: 7 Glasses of wine per week    Comment: occasionally  Marital Status: Divorced   ROS  Review of Systems  Constitutional: Positive for malaise/fatigue.  Cardiovascular:  Positive for dyspnea on exertion. Negative for chest pain, leg swelling and palpitations.  Gastrointestinal:  Negative for melena.  Neurological:  Positive for dizziness.  Objective  Blood pressure 135/72, pulse 80, temperature 98.6 F (37 C), temperature source Temporal, resp. rate 17,  height 5' 6" (1.676 m), weight 161 lb (73 kg), SpO2 98 %.  Vitals with BMI 01/15/2021 04/11/2020 08/28/2019  Height 5' 6" 5' 6" 5' 6"  Weight 161 lbs 157 lbs 151 lbs 13 oz  BMI 26 65.46 50.35  Systolic 465 681 275  Diastolic 72 63 66  Pulse 80 82 60    No data found.   Physical Exam Neck:     Vascular: No carotid bruit or JVD.  Cardiovascular:     Rate and Rhythm: Normal rate and regular rhythm.     Pulses: Normal pulses and intact distal pulses.     Heart sounds: A midsystolic click. Murmur heard.  Crescendo crescendo-decrescendo mid to late systolic murmur is present with a  grade of 2/6 at the apex.    No gallop.  Pulmonary:     Effort: Pulmonary effort is normal.     Breath sounds: Normal breath sounds.  Abdominal:     General: Bowel sounds are normal.     Palpations: Abdomen is soft.  Musculoskeletal:     Right lower leg: No edema.     Left lower leg: No edema.  Skin:    General: Skin is warm.   Laboratory examination:   Recent Labs    01/14/21 0946  NA 134  K 4.7  CL 98  CO2 25  GLUCOSE 92  BUN 12  CREATININE 0.97  CALCIUM 9.2   estimated creatinine clearance is 59.1 mL/min (by C-G formula based on SCr of 0.97 mg/dL).  CMP Latest Ref Rng & Units 01/14/2021 09/30/2016 09/29/2016  Glucose 65 - 99 mg/dL 92 109(H) 168(H)  BUN 8 - 27 mg/dL _0 Creatinine 0.57 - 1.00 mg/dL 0.97 0.84 0.85  Sodium 134 - 144 mmol/L 134 136 135  Potassium 3.5 - 5.2 mmol/L 4.7 5.4(H) 3.9  Chloride 96 - 106 mmol/L 98 103 101  CO2 20 - 29 mmol/L _1 Calcium 8.7 - 10.3 mg/dL 9.2 9.5 9.2  Total Protein 6.0 - 8.5 g/dL 6.8 - -  Total Bilirubin 0.0 - 1.2 mg/dL 0.3 - -  Alkaline Phos 44 - 121 IU/L 81 - -  AST 0 - 40 IU/L 22 - -  ALT 0 - 32 IU/L 14 - -   CBC Latest Ref Rng & Units 09/30/2016 09/29/2016 06/15/2011  WBC 4.0 - 10.5 K/uL 8.2 6.1 5.4  Hemoglobin 12.0 - 15.0 g/dL 11.4(L) 12.3 10.7(L)  Hematocrit 36.0 - 46.0 % 33.8(L) 36.7 31.8(L)  Platelets 150 - 400 K/uL 284 287 266   Lipid Panel Recent Labs    04/08/20 1030 04/08/20 1032 06/13/20 0944 01/14/21 0945 01/14/21 0947  CHOL  --  266* 241* 219*  --   TRIG  --  110 75 99  --   LDLCALC  --  177* 146* 137*  --   HDL  --  70 82 65  --   LDLDIRECT 180*  --   --   --  137*   HEMOGLOBIN A1C Lab Results  Component Value Date   HGBA1C 5.5 09/30/2016   MPG 111 09/30/2016   TSH Recent Labs    04/08/20 1029 01/14/21 0945  TSH 4.590* 3.670    Labs 03/24/2019:   Cholesterol, total 278.000 M 03/24/2019  HDL 82.000 M 03/24/2019 LDL 78.000 mg 03/16/2018 Triglycerides 81.000 M 03/24/2019, non-HDL  196. A1C 5.600 % of 03/17/2019; TSH 5.660 03/24/2019    Hemoglobin 12.500 G/ 03/24/2019 Platelets 344.000 X 03/24/2019 Creatinine, Serum 0.910  MG/ 03/24/2019 Potassium 4.700 mm 03/16/2018 ALT (SGPT) 23.000 IU/ 03/24/2019  Medications and allergies   Allergies  Allergen Reactions   Zofran Other (See Comments)    zofran causes drop in blood pressure   Codeine Nausea Only   Other Other (See Comments)    OPIATES & CODEINE CAUSE GI UPSET     Current Outpatient Medications  Medication Instructions   ALPRAZolam (XANAX) 1 MG tablet 1 tablet, Oral, Daily at bedtime   amitriptyline (ELAVIL) 25 MG tablet 1 tablet, Oral, Daily   aspirin 81 mg, Oral, Daily   calcium carbonate (OS-CAL) 600 mg, Oral, Daily   DULoxetine HCl (CYMBALTA PO) 150 mg, Oral, Daily   estradiol (VIVELLE-DOT) 0.1 MG/24HR patch 1 patch, Transdermal, 2 times weekly   Evolocumab (REPATHA SURECLICK) 601 MG/ML SOAJ 1 pen, Subcutaneous, Every 14 days   MAGNESIUM CITRATE PO 300 mg, Oral, Daily   Omega-3 1400 MG CAPS Oral, Daily   pravastatin (PRAVACHOL) 20 MG tablet TAKE 1 TABLET(20 MG) BY MOUTH EVERY EVENING   progesterone (PROMETRIUM) 100 mg, Oral, Daily at bedtime   Rexulti 0.5 mg, Oral, Daily at bedtime   TURMERIC PO 80 mg, Oral, Daily   Vitamin D 2,500 Units, Oral, Daily   Radiology:  Aortic atherosclerosis (I70.0)   CT of the chest without contrast 11/10/2016 [facility]):  Mild to moderate coronary artery calcification, normal thoracic aorta, mild calcification of the aortic valve and aortic root. Fibrotic changes in both upper lobes likely related to prior radiation therapy.   VQ Scan at [facility] 11/10/2016: Negative for PE.  Cardiac Studies:   Coronary angiogram 09/29/16: Dynamic LV systolic function, EF 09%. Left dominant circulation. Mild disease in the ramus and LAD. Circumflex midsegment 90% stenosis by intravascular ultrasound. Successful stenting with 4.0 x 18 mm Onyx DES.  Treadmill exercise stress  test 10/23/2016: Indication: CP and SoB Resting EKG demonstrates NSR. The patient exercised according to Bruce Protocol, Total time recorded 7:59 min achieving max heart rate of 136 which was 85% of THR for age and 10.11 METS of work.  Stress terminated due to dyspnea, lightheadedness and THR (>85% MPHR)/MPHR met. Normal BP response.  Patient developed LBBB with peak exercise which reversed to normal at 2 minutes into recovery. Post recovery of LBBB EKG shows 1 mm ST depression with T inversion in high lateral and lateral leads. Normal BP response. Rec: Equivocal stress test for ischemia with development of exercise induced LBBB. Clinical correlation recommended.  Carotid artery duplex  12/27/2018: No significant stenosis in the right carotid artery. Stenosis in the left internal carotid artery (16-49%). Antegrade right vertebral artery flow. Antegrade left vertebral artery flow. Compared to 02/01/2018, left carotid stenosis severity was previously > 50%. Follow up in one year is appropriate if clinically indicated.  Event monitor 07/21/2019: Normal sinus rhythm.  No significant arrhythmias.  Preliminary report. No significant change from  06/18/17 (Occasional PVC and PAC.  Lexiscan (Walking with Jaci Carrel) Sestamibi Stress Test 07/17/2019: Non-diagnostic ECG stress. Resting EKG/ECG demonstrated normal sinus rhythm with left bundle branch block. Peak EKG/ECG revealed no significant ST-T change from baseline abnormality. There is a fixed mild defect in the septal and apical regions. This defects can be present in LBBB. Also there is mild soft tissue attenuation in this region. Stress LV EF: 58%.  No wall motion abnormality.  Low risk study. No previous exam available for comparison.  Echocardiogram 07/21/2019:  Left ventricle cavity is normal in size. Moderate concentric hypertrophy of the left ventricle.  Normal LV systolic function with EF 66%. Normal global wall motion. Indeterminate diastolic  filling pattern. Trileaflet aortic valve.  Mild (Grade I) aortic regurgitation.  Moderate (Grade II) mitral regurgitation.  Mild tricuspid regurgitation. Estimated pulmonary artery systolic pressure is 22 mmHg.  No significant change compared to previous study in 2018.   Lower extremity venous duplex 04/05/2020: LEFT: - There is no evidence of deep vein thrombosis in the lower extremity. - There is no evidence of superficial venous thrombosis.  EKG:    EKG 04/11/2020: Normal sinus rhythm at rate of 78 bpm, left bundle branch block.  No significant change from 07/12/2019.  Assessment     ICD-10-CM   1. Coronary artery disease of native artery of native heart with stable angina pectoris (HCC)  I25.118 EKG 12-Lead    Evolocumab (REPATHA SURECLICK) 935 MG/ML SOAJ    2. LBBB (left bundle branch block)  I44.7     3. Hypercholesteremia  E78.00 Evolocumab (REPATHA SURECLICK) 701 MG/ML SOAJ    Lipid Panel With LDL/HDL Ratio    Lipid Panel With LDL/HDL Ratio    Evolocumab SOAJ 140 mg    4. Asymptomatic stenosis of left carotid artery  I65.22 PCV CAROTID DUPLEX (BILATERAL)    5. Moderate mitral regurgitation  I34.0 PCV ECHOCARDIOGRAM COMPLETE    6. Dyspnea on exertion  R06.00     7. Pure hypercholesterolemia  E78.00      Meds ordered this encounter  Medications   Evolocumab (REPATHA SURECLICK) 779 MG/ML SOAJ    Sig: Inject 1 pen into the skin every 14 (fourteen) days.    Dispense:  6 mL    Refill:  3   Evolocumab SOAJ 140 mg     Medications Discontinued During This Encounter  Medication Reason   DULoxetine (CYMBALTA) 20 MG capsule Error   DULoxetine (CYMBALTA) 30 MG capsule Error   VITAMIN D PO Error   Bempedoic Acid-Ezetimibe (NEXLIZET) 180-10 MG TABS Error   atorvastatin (LIPITOR) 10 MG tablet Side effect (s)    Recommendations:   Cynthia Poole  is a 65 y.o. Caucasian female with hyperlipidemia, coronary artery disease status post PCI to the dominant circumflex in 2018,  LBBB, history of Hodgkin's lymphoma 1997 status post chemotherapy and radiation therapy to chest presents for annual f/u.  She continues to have dyspnea on exertion, fatigue and occasional palpitations.  No chest pain, syncope.  However on physical examination, left carotid artery bruit appears clearly more prominent and mitral regurgitation murmur also appears more prominent.  I will repeat carotid artery duplex and echocardiogram.  I reviewed the results of the recently performed labs, LDL is not at goal, she has not been able to tolerate any of the statins, she is presently only on 10 mg of pravastatin.  I had extensive and lengthy discussion with the patient the importance of getting LDL to goal to <70 in view of carotid disease and coronary artery disease.  After extensive discussion and discussions regarding the available data and literature regarding PCSK9 inhibitors, she is willing to try Repatha which is administered today, prescription sent.  Her continued dyspnea may be related to deconditioning, do not suspect sleep apnea, but need to exclude worsening mitral regurgitation.  We will repeat lipids in 4 to 6 weeks and I would like to see her back then for follow-up of echocardiogram and carotid duplex.  Otherwise no change in physical exam, TSH was normal, T3 was normal, T4 was mildly abnormal, the results of the lab test  was sent to her PCP.   Administration Action Time Recorded Time Documented By Site Comment Reason Patient Supplied  Given : 140 mg :   : Subcutaneous 01/15/21 1658 01/15/21 [place], [employer] L Right Anterior Thigh   No     Adrian Prows, MD, [facility] 01/15/2021, 10:37 PM Office: [phone] Pager: [phone]

## 2021-02-11 ENCOUNTER — Ambulatory Visit: Payer: Medicare Other

## 2021-02-11 ENCOUNTER — Other Ambulatory Visit: Payer: Self-pay

## 2021-02-11 DIAGNOSIS — I340 Nonrheumatic mitral (valve) insufficiency: Secondary | ICD-10-CM

## 2021-02-11 DIAGNOSIS — I6522 Occlusion and stenosis of left carotid artery: Secondary | ICD-10-CM

## 2021-02-24 NOTE — Progress Notes (Signed)
02/19/2021:  Vitamin D 28.7.  Free T4 normal.  TSH normal.  Total cholesterol 173, triglycerides 76, HDL 61, LDL 97.  Non-HDL cholesterol 112.  Serum glucose _0 mg, BUN 12, creatinine 0.9, EGFR 62 mL, potassium 4.4, sodium 133, CMP otherwise normal.  Hb 12.4/HCT 35.8, platelets 271.

## 2021-02-27 ENCOUNTER — Other Ambulatory Visit: Payer: Self-pay

## 2021-02-27 ENCOUNTER — Encounter: Payer: Self-pay | Admitting: Cardiology

## 2021-02-27 ENCOUNTER — Ambulatory Visit: Payer: Medicare Other | Admitting: Cardiology

## 2021-02-27 VITALS — BP 121/79 | HR 75 | Temp 98.4°F | Resp 17 | Ht 66.0 in | Wt 163.0 lb

## 2021-02-27 DIAGNOSIS — E7800 Pure hypercholesterolemia, unspecified: Secondary | ICD-10-CM

## 2021-02-27 DIAGNOSIS — R0609 Other forms of dyspnea: Secondary | ICD-10-CM

## 2021-02-27 DIAGNOSIS — I6522 Occlusion and stenosis of left carotid artery: Secondary | ICD-10-CM

## 2021-02-27 DIAGNOSIS — I25118 Atherosclerotic heart disease of native coronary artery with other forms of angina pectoris: Secondary | ICD-10-CM

## 2021-02-27 DIAGNOSIS — R0600 Dyspnea, unspecified: Secondary | ICD-10-CM

## 2021-02-27 MED ORDER — REPATHA SURECLICK 140 MG/ML ~~LOC~~ SOAJ: 1.0000 "pen " | SUBCUTANEOUS | 3 refills | Status: DC

## 2021-02-27 NOTE — Progress Notes (Signed)
 Primary Physician/Referring:  Paterson, Daniel, MD  Patient ID: Cynthia Poole, female    DOB: 01/04/1956, 65 y.o.   MRN: [id number]  Chief Complaint  Patient presents with   Hyperlipidemia   Follow-up    6 WEEKS   Results   Coronary artery disease of native artery of native heart wi   HPI:    Cynthia Poole  is a 65 y.o. Caucasian female with hyperlipidemia, coronary artery disease status post PCI to the dominant circumflex in 2018, LBBB, history of Hodgkin's lymphoma 1997 status post chemotherapy and radiation therapy to chest presents for annual f/u.  She continues to have dyspnea on exertion, fatigue and occasional palpitations.  No chest pain, syncope.  I had started her on low-dose statins with pravastatin 20 mg in the evening and also had started her on Repatha, when I saw her 6 weeks ago.  Patient did not continue with Repatha stating that local site irritation bothers her.    She continues to have dyspnea on exertion, fatigue, frequent dizziness when she stands up and occasional palpitations.  No chest pain, syncope.  Past Medical History:  Diagnosis Date   Anemia    CAD in native artery 09/29/2016   Coronary angiogram 09/29/16: Dynamic LV systolic function, EF 65%. Left dominant circulation. Mild disease in the ramus and LAD. Circumflex midsegment  90% by IVUS S/P stenting 4.0 x 18 mm Onyx DES.   DDD (degenerative disc disease), cervical    DDD (degenerative disc disease), lumbar    Depression    Fibromyalgia 03/2004   GERD (gastroesophageal reflux disease)    Heart murmur    History of pleurisy    HLD (hyperlipidemia)    Hodgkin's disease 1997    S/P Chemo, Chest RT and splenectomy   Hypertension    pt denies   Hypothyroidism    no longer per pt    Insomnia    Internal and external bleeding hemorrhoids    Migraines    MVP (mitral valve prolapse)    Osteopenia    Personal history of colonic adenoma 06/29/2011   06/29/2011 - diminutive adenoma   Seasonal allergies     Shingles 2018   Vitamin D deficiency    Past Surgical History:  Procedure Laterality Date   BUNIONECTOMY     CARDIAC CATHETERIZATION  09/29/2016   COLONOSCOPY  09/02/2007   redundant colon, external hemorrhoids   COLONOSCOPY  06/29/2011   diminutive polyp, diverticulosis, external hemorrhoids   CORONARY STENT INTERVENTION N/A 09/29/2016   Procedure: Coronary Stent Intervention;  Surgeon: Jay Ganji, MD;  Location: [facility];  Service: Cardiovascular;  Laterality: N/A;   DEEP NECK LYMPH NODE BIOPSY / EXCISION  1996   right LN    ESOPHAGOGASTRODUODENOSCOPY  06/29/2011   notrmal, 54 Fr dilation (dysphagia)   INTRAVASCULAR ULTRASOUND/IVUS N/A 09/29/2016   Procedure: Intravascular Ultrasound/IVUS;  Surgeon: Jay Ganji, MD;  Location: [facility];  Service: Cardiovascular;  Laterality: N/A;   LEFT HEART CATH AND CORONARY ANGIOGRAPHY N/A 09/29/2016   Procedure: Left Heart Cath and Coronary Angiography;  Surgeon: Jay Ganji, MD;  Location: [facility];  Service: Cardiovascular;  Laterality: N/A;   SPLENECTOMY, TOTAL  1996   STRABISMUS SURGERY  02/2009   left   TONSILLECTOMY     age [age]   Social History   Tobacco Use   Smoking status: Never   Smokeless tobacco: Never  Substance Use Topics   Alcohol use: Yes      Alcohol/week: 7.0 standard drinks    Types: 7 Glasses of wine per week    Comment: occasionally  Marital Status: Divorced   ROS  Review of Systems  Constitutional: Positive for malaise/fatigue.  Cardiovascular:  Positive for dyspnea on exertion. Negative for chest pain, leg swelling and palpitations.  Gastrointestinal:  Negative for melena.  Neurological:  Positive for dizziness.  Objective  Blood pressure 121/79, pulse 75, temperature 98.4 F (36.9 C), temperature source Temporal, resp. rate 17, height 5' 6" (1.676 m), weight 163 lb (73.9 kg), SpO2 98 %.  Vitals with BMI 02/27/2021 02/27/2021 01/15/2021  Height - 5' 6" 5' 6"  Weight - 163 lbs 161 lbs  BMI -  05.39 26  Systolic 767 341 937  Diastolic 79 75 72  Pulse 75 78 80    No data found.   Physical Exam Neck:     Vascular: No carotid bruit or JVD.  Cardiovascular:     Rate and Rhythm: Normal rate and regular rhythm.     Pulses: Normal pulses and intact distal pulses.     Heart sounds: A midsystolic click. Murmur heard.  Crescendo crescendo-decrescendo mid to late systolic murmur is present with a grade of 2/6 at the apex.    No gallop.  Pulmonary:     Effort: Pulmonary effort is normal.     Breath sounds: Normal breath sounds.  Abdominal:     General: Bowel sounds are normal.     Palpations: Abdomen is soft.  Musculoskeletal:     Right lower leg: No edema.     Left lower leg: No edema.  Skin:    General: Skin is warm.   Laboratory examination:   Recent Labs    01/14/21 0946  NA 134  K 4.7  CL 98  CO2 25  GLUCOSE 92  BUN 12  CREATININE 0.97  CALCIUM 9.2   CrCl cannot be calculated (Patient's most recent lab result is older than the maximum 21 days allowed.).  CMP Latest Ref Rng & Units 01/14/2021 09/30/2016 09/29/2016  Glucose 65 - 99 mg/dL 92 109(H) 168(H)  BUN 8 - 27 mg/dL _0 Creatinine 0.57 - 1.00 mg/dL 0.97 0.84 0.85  Sodium 134 - 144 mmol/L 134 136 135  Potassium 3.5 - 5.2 mmol/L 4.7 5.4(H) 3.9  Chloride 96 - 106 mmol/L 98 103 101  CO2 20 - 29 mmol/L _1 Calcium 8.7 - 10.3 mg/dL 9.2 9.5 9.2  Total Protein 6.0 - 8.5 g/dL 6.8 - -  Total Bilirubin 0.0 - 1.2 mg/dL 0.3 - -  Alkaline Phos 44 - 121 IU/L 81 - -  AST 0 - 40 IU/L 22 - -  ALT 0 - 32 IU/L 14 - -   CBC Latest Ref Rng & Units 09/30/2016 09/29/2016 06/15/2011  WBC 4.0 - 10.5 K/uL 8.2 6.1 5.4  Hemoglobin 12.0 - 15.0 g/dL 11.4(L) 12.3 10.7(L)  Hematocrit 36.0 - 46.0 % 33.8(L) 36.7 31.8(L)  Platelets 150 - 400 K/uL 284 287 266   Lipid Panel Recent Labs    04/08/20 1030 04/08/20 1032 06/13/20 0944 01/14/21 0945 01/14/21 0947  CHOL  --  266* 241* 219*  --   TRIG  --  110 75 99  --    LDLCALC  --  177* 146* 137*  --   HDL  --  70 82 65  --   LDLDIRECT 180*  --   --   --  137*   HEMOGLOBIN A1C Lab  Results  Component Value Date   HGBA1C 5.5 09/30/2016   MPG 111 09/30/2016   TSH Recent Labs    04/08/20 1029 01/14/21 0945  TSH 4.590* 3.670   Labs 03/24/2019:   02/19/2021:  Vitamin D 28.7.  Free T4 normal.  TSH normal.  Total cholesterol 173, triglycerides 76, HDL 61, LDL 97.  Non-HDL cholesterol 112.  Serum glucose _0 mg, BUN 12, creatinine 0.9, EGFR 62 mL, potassium 4.4, sodium 133, CMP otherwise normal.  Hb 12.4/HCT 35.8, platelets 271.  Cholesterol, total 278.000 M 03/24/2019   HDL 82.000 M 03/24/2019  LDL 78.000 mg 03/16/2018  Triglycerides 81.000 M 03/24/2019,  non-HDL 196. A1C 5.600 % of 03/17/2019; TSH 5.660 03/24/2019    Hemoglobin 12.500 G/ 03/24/2019 Platelets 344.000 X 03/24/2019 Creatinine, Serum 0.910 MG/ 03/24/2019 Potassium 4.700 mm 03/16/2018 ALT (SGPT) 23.000 IU/ 03/24/2019  Medications and allergies   Allergies  Allergen Reactions   Zofran Other (See Comments)    zofran causes drop in blood pressure   Codeine Nausea Only   Other Other (See Comments)    OPIATES & CODEINE CAUSE GI UPSET     Current Outpatient Medications  Medication Instructions   ALPRAZolam (XANAX) 1 MG tablet 1 tablet, Oral, Daily at bedtime   amitriptyline (ELAVIL) 25 MG tablet 1 tablet, Oral, Daily   aspirin 81 mg, Oral, Daily   calcium carbonate (OS-CAL) 600 mg, Oral, Daily   DULoxetine HCl (CYMBALTA PO) 150 mg, Oral, Daily   estradiol (VIVELLE-DOT) 0.1 MG/24HR patch 1 patch, Transdermal, 2 times weekly   Evolocumab (REPATHA SURECLICK) 449 MG/ML SOAJ 1 pen, Subcutaneous, Every 14 days   MAGNESIUM CITRATE PO 300 mg, Oral, Daily   Omega-3 1400 MG CAPS Oral, Daily   pravastatin (PRAVACHOL) 20 MG tablet TAKE 1 TABLET(20 MG) BY MOUTH EVERY EVENING   progesterone (PROMETRIUM) 100 mg, Oral, Daily at bedtime   Rexulti 0.5 mg, Oral, Daily at bedtime   TURMERIC PO  80 mg, Oral, Daily   Vitamin D 2,500 Units, Oral, Daily   Radiology:  Aortic atherosclerosis (I70.0)   CT of the chest without contrast 11/10/2016 [facility]):  Mild to moderate coronary artery calcification, normal thoracic aorta, mild calcification of the aortic valve and aortic root. Fibrotic changes in both upper lobes likely related to prior radiation therapy.   VQ Scan at [facility] 11/10/2016: Negative for PE.  Cardiac Studies:   Coronary angiogram 09/29/16: Dynamic LV systolic function, EF 20%. Left dominant circulation. Mild disease in the ramus and LAD. Circumflex midsegment 90% stenosis by intravascular ultrasound. Successful stenting with 4.0 x 18 mm Onyx DES.  Treadmill exercise stress test 10/23/2016: Indication: CP and SoB Resting EKG demonstrates NSR. The patient exercised according to Bruce Protocol, Total time recorded 7:59 min achieving max heart rate of 136 which was 85% of THR for age and 10.11 METS of work.  Stress terminated due to dyspnea, lightheadedness and THR (>85% MPHR)/MPHR met. Normal BP response.  Patient developed LBBB with peak exercise which reversed to normal at 2 minutes into recovery. Post recovery of LBBB EKG shows 1 mm ST depression with T inversion in high lateral and lateral leads. Normal BP response. Rec: Equivocal stress test for ischemia with development of exercise induced LBBB. Clinical correlation recommended.  Event monitor 07/21/2019: Normal sinus rhythm.  No significant arrhythmias.  Preliminary report. No significant change from  06/18/17 (Occasional PVC and PAC.  Lexiscan (Walking with Jaci Carrel) Sestamibi Stress Test 07/17/2019: Non-diagnostic ECG stress. Resting EKG/ECG demonstrated  normal sinus rhythm with left bundle branch block. Peak EKG/ECG revealed no significant ST-T change from baseline abnormality. There is a fixed mild defect in the septal and apical regions. This defects can be present in LBBB. Also there is mild soft  tissue attenuation in this region. Stress LV EF: 58%.  No wall motion abnormality.  Low risk study. No previous exam available for comparison.  Lower extremity venous duplex 04/05/2020: LEFT: - There is no evidence of deep vein thrombosis in the lower extremity. - There is no evidence of superficial venous thrombosis.  PCV ECHOCARDIOGRAM COMPLETE 02/11/2021 Left ventricle cavity is normal in size. Mild concentric hypertrophy of the left ventricle. Normal global wall motion. Normal LV systolic function with EF 55%. Doppler evidence of grade I (impaired) diastolic dysfunction, normal LAP. Aneurysmal interatrial septum without 2D or color Doppler evidence of shunting. Trileaflet aortic valve.  Moderate (Grade II) aortic regurgitation. Moderate (Grade II) mitral regurgitation. Mild to moderate tricuspid regurgitation. Mild pulmonic regurgitation. No evidence of pulmonary hypertension. Study on 07/21/2019 showed mild AI. mild TR.  Carotid artery duplex 02/11/2021: Duplex suggests stenosis in the right internal carotid artery (1-15%). Duplex suggests stenosis in the left internal carotid artery (50-69%). Antegrade right vertebral artery flow. Antegrade left vertebral artery flow. Compared to the study done on 04/30/2020, there is mild progression of disease in bilateral carotid artery, left has progressed more with previous study revealing <50% stenosis. Follow up in six months is appropriate if clinically indicated.  EKG:    EKG 04/11/2020: Normal sinus rhythm at rate of 78 bpm, left bundle branch block.  No significant change from 07/12/2019.  Assessment     ICD-10-CM   1. Coronary artery disease of native artery of native heart with stable angina pectoris (HCC)  I25.118 Evolocumab (REPATHA SURECLICK) 387 MG/ML SOAJ    2. Asymptomatic stenosis of left carotid artery  I65.22 PCV CAROTID DUPLEX (BILATERAL)    3. Dyspnea on exertion  R06.00 PCV CARDIAC STRESS TEST    4. Pure  hypercholesterolemia  E78.00      Meds ordered this encounter  Medications   Evolocumab (REPATHA SURECLICK) 564 MG/ML SOAJ    Sig: Inject 1 pen into the skin every 14 (fourteen) days.    Dispense:  6 mL    Refill:  3      Medications Discontinued During This Encounter  Medication Reason   Evolocumab (REPATHA SURECLICK) 332 MG/ML SOAJ Error    Recommendations:   Cynthia Poole  is a 65 y.o. Caucasian female with hyperlipidemia, coronary artery disease status post PCI to the dominant circumflex in 2018, LBBB, history of Hodgkin's lymphoma 1997 status post chemotherapy and radiation therapy to chest presents for annual f/u.  She continues to have dyspnea on exertion, fatigue and occasional palpitations.  No chest pain, syncope.  I had started her on low-dose statins with pravastatin 20 mg in the evening and also had started her on Repatha, when I saw her 6 weeks ago.  Patient did not continue with Repatha stating that local site irritation bothers her.  I discussed the findings of carotid artery duplex and progression of disease specifically left carotid artery she is now willing to try Repatha again, 1 sample given. Recheck carotid duplex in 6 months.  With regard to dyspnea on exertion, she has mitral valvular heart disease including aortic, mitral and tricuspid regurgitation which are mild to moderate without clinical evidence of heart failure.  Do not think this is the etiology for dyspnea however  her symptoms are getting fairly worse and I will perform a routine treadmill stress test to evaluate functional status.  If I do not see any significant arrhythmias, she definitely needs to have pulmonary consultation especially in view of history of non-Hodgkin's lymphoma and radiation therapy to her chest in the remote past, IPF needs to be excluded.  I will see her back in 6 months unless GXT is abnormal.  She has underlying left bundle branch block hence will be nondiagnostic for  ischemia.     Adrian Prows, MD, [facility] 02/27/2021, 5:31 PM Office: [phone] Pager: [phone]

## 2021-03-14 ENCOUNTER — Encounter: Payer: Self-pay | Admitting: Internal Medicine

## 2021-03-19 ENCOUNTER — Other Ambulatory Visit (HOSPITAL_COMMUNITY): Payer: Self-pay | Admitting: Respiratory Therapy

## 2021-03-19 DIAGNOSIS — R0609 Other forms of dyspnea: Secondary | ICD-10-CM

## 2021-03-19 DIAGNOSIS — R0600 Dyspnea, unspecified: Secondary | ICD-10-CM

## 2021-04-18 ENCOUNTER — Other Ambulatory Visit: Payer: Self-pay

## 2021-04-18 ENCOUNTER — Ambulatory Visit: Payer: Medicare Other

## 2021-04-18 DIAGNOSIS — R0609 Other forms of dyspnea: Secondary | ICD-10-CM

## 2021-04-18 LAB — LIPID PANEL WITH LDL/HDL RATIO
Cholesterol, Total: 206 mg/dL — ABNORMAL HIGH (ref 100–199)
HDL: 61 mg/dL (ref >39)
LDL Chol Calc (NIH): 129 mg/dL — ABNORMAL HIGH (ref 0–99)
LDL/HDL Ratio: 2.1 ratio (ref 0.0–3.2)
Triglycerides: 91 mg/dL (ref 0–149)
VLDL Cholesterol Cal: 16 mg/dL (ref 5–40)

## 2021-04-21 ENCOUNTER — Other Ambulatory Visit: Payer: Self-pay | Admitting: Cardiology

## 2021-04-21 DIAGNOSIS — E7800 Pure hypercholesterolemia, unspecified: Secondary | ICD-10-CM

## 2021-04-21 DIAGNOSIS — R0609 Other forms of dyspnea: Secondary | ICD-10-CM

## 2021-04-21 DIAGNOSIS — I25118 Atherosclerotic heart disease of native coronary artery with other forms of angina pectoris: Secondary | ICD-10-CM

## 2021-04-21 MED ORDER — METOPROLOL SUCCINATE ER 50 MG PO TB24: 50.0000 mg | ORAL_TABLET | Freq: Every day | ORAL | 2 refills | Status: DC

## 2021-04-21 MED ORDER — ATORVASTATIN CALCIUM 20 MG PO TABS: 20.0000 mg | ORAL_TABLET | Freq: Every day | ORAL | 2 refills | Status: DC

## 2021-04-21 NOTE — Progress Notes (Signed)
ICD-10-CM   1. Dyspnea on exertion  R06.09 Ambulatory referral to Pulmonology    2. Pure hypercholesterolemia  E78.00 atorvastatin (LIPITOR) 20 MG tablet    Lipid Panel With LDL/HDL Ratio    3. Coronary artery disease of native artery of native heart with stable angina pectoris (HCC)  I25.118 metoprolol succinate (TOPROL-XL) 50 MG 24 hr tablet      Exercise treadmill stress test 04/18/2021: Exercise treadmill stress test performed using Bruce protocol.  Patient reached 8.8 METS, and 110% of age predicted maximum heart rate.  Exercise capacity was good.  No chest pain reported. Dyspnea, dizziness reported. Normal heart rate and hemodynamic response. Peak BP 200/100 mm Hg.  Peak stress EKG showed sinus tachycardia 150 bpm, LBBB. Ischemia evaluation limited due to LBBB. Consider alternate ischemia testing, if clinical suspicion is high.   Orders Placed This Encounter  Procedures   Lipid Panel With LDL/HDL Ratio    Standing Status:   Future    Standing Expiration Date:   04/21/2022   Ambulatory referral to Pulmonology    Referral Priority:   Routine    Referral Type:   Consultation    Referral Reason:   Specialty Services Required    Requested Specialty:   Pulmonary Disease    Number of Visits Requested:   1   Meds ordered this encounter  Medications   atorvastatin (LIPITOR) 20 MG tablet    Sig: Take 1 tablet (20 mg total) by mouth daily.    Dispense:  30 tablet    Refill:  2   metoprolol succinate (TOPROL-XL) 50 MG 24 hr tablet    Sig: Take 1 tablet (50 mg total) by mouth daily. Take with or immediately following a meal.    Dispense:  30 tablet    Refill:  2    Adrian Prows, MD, [facility] 04/21/2021, 5:36 PM Office: [phone] Fax: [phone] Pager: [phone]

## 2021-04-21 NOTE — Progress Notes (Signed)
Exercise treadmill stress test 04/18/2021: Exercise treadmill stress test performed using Bruce protocol.  Patient reached 8.8 METS, and 110% of age predicted maximum heart rate.  Exercise capacity was good.  No chest pain reported. Dyspnea, dizziness reported. Normal heart rate and hemodynamic response. Peak BP 200/100 mm Hg.  Peak stress EKG showed sinus tachycardia 150 bpm, LBBB. Ischemia evaluation limited due to LBBB. Consider alternate ischemia testing, if clinical suspicion is high.

## 2021-04-21 NOTE — Telephone Encounter (Signed)
Fro pt

## 2021-05-05 ENCOUNTER — Other Ambulatory Visit: Payer: Self-pay

## 2021-05-05 ENCOUNTER — Ambulatory Visit (AMBULATORY_SURGERY_CENTER): Payer: Medicare Other | Admitting: *Deleted

## 2021-05-05 VITALS — Ht 65.0 in | Wt 160.0 lb

## 2021-05-05 DIAGNOSIS — Z86010 Personal history of colonic polyps: Secondary | ICD-10-CM

## 2021-05-05 NOTE — Progress Notes (Signed)
PV completed over the phone. Pt verified name, DOB, address and insurance during PV today.  Pt mailed instruction packet with copy of consent form to read and not return, and instructions.  Pt encouraged to call with questions or issues.  If pt has My chart, procedure instructions sent via My Chart   No egg or soy allergy known to patient  No issues known to pt with past sedation with any surgeries or procedures Patient denies ever being told they had issues or difficulty with intubation  No FH of Malignant Hyperthermia Pt is not on diet pills Pt is not on  home 02  Pt is not on blood thinners  Pt states issues with constipation- pt takes Magnesium and it helps - no daily BM-  will do a 2 day Miralax prep  No A fib or A flutter  Pt is fully vaccinated  for Covid   Due to the COVID-19 pandemic we are asking patients to follow certain guidelines in PV and the [place]   Pt aware of COVID protocols and [facility] guidelines

## 2021-05-19 ENCOUNTER — Ambulatory Visit (AMBULATORY_SURGERY_CENTER): Payer: Medicare Other | Admitting: Internal Medicine

## 2021-05-19 ENCOUNTER — Encounter: Payer: Self-pay | Admitting: Internal Medicine

## 2021-05-19 VITALS — BP 113/84 | HR 87 | Temp 98.7°F | Resp 13 | Ht 66.0 in | Wt 160.0 lb

## 2021-05-19 DIAGNOSIS — Z86010 Personal history of colonic polyps: Secondary | ICD-10-CM | POA: Diagnosis not present

## 2021-05-19 MED ORDER — SODIUM CHLORIDE 0.9 % IV SOLN: 500.0000 mL | Freq: Once | INTRAVENOUS | Status: DC

## 2021-05-19 NOTE — Progress Notes (Signed)
VS completed by CW.   Pt's states no medical or surgical changes since previsit or office visit.  

## 2021-05-19 NOTE — Progress Notes (Signed)
Report to PACU, RN, vss, BBS= Clear.  

## 2021-05-19 NOTE — Patient Instructions (Addendum)
No polyps today! You do have diverticulosis - thickened muscle rings and pouches in the colon wall. Please read the handout about this condition.  Next routine colonoscopy or other screening test in 10 years - 2032.  I appreciate the opportunity to care for you. Gatha Mayer, MD, [facility]  Thank you for allowing [place] to take care of your healthcare needs.  Please see the handout provided.   YOU HAD AN ENDOSCOPIC PROCEDURE TODAY AT [facility]:   Refer to the procedure report that was given to you for any specific questions about what was found during the examination.  If the procedure report does not answer your questions, please call your gastroenterologist to clarify.  If you requested that your care partner not be given the details of your procedure findings, then the procedure report has been included in a sealed envelope for you to review at your convenience later.  YOU SHOULD EXPECT: Some feelings of bloating in the abdomen. Passage of more gas than usual.  Walking can help get rid of the air that was put into your GI tract during the procedure and reduce the bloating. If you had a lower endoscopy (such as a colonoscopy or flexible sigmoidoscopy) you may notice spotting of blood in your stool or on the toilet paper. If you underwent a bowel prep for your procedure, you may not have a normal bowel movement for a few days.  Please Note:  You might notice some irritation and congestion in your nose or some drainage.  This is from the oxygen used during your procedure.  There is no need for concern and it should clear up in a day or so.  SYMPTOMS TO REPORT IMMEDIATELY:  Following lower endoscopy (colonoscopy or flexible sigmoidoscopy):  Excessive amounts of blood in the stool  Significant tenderness or worsening of abdominal pains  Swelling of the abdomen that is new, acute  Fever of 100F or higher   For urgent or emergent issues, a gastroenterologist can be reached at any  hour by calling [phone]. Do not use MyChart messaging for urgent concerns.    DIET:  We do recommend a small meal at first, but then you may proceed to your regular diet.  Drink plenty of fluids but you should avoid alcoholic beverages for 24 hours.  ACTIVITY:  You should plan to take it easy for the rest of today and you should NOT DRIVE or use heavy machinery until tomorrow (because of the sedation medicines used during the test).    FOLLOW UP: Our staff will call the number listed on your records 48-72 hours following your procedure to check on you and address any questions or concerns that you may have regarding the information given to you following your procedure. If we do not reach you, we will leave a message.  We will attempt to reach you two times.  During this call, we will ask if you have developed any symptoms of COVID 19. If you develop any symptoms (ie: fever, flu-like symptoms, shortness of breath, cough etc.) before then, please call [phone].  If you test positive for Covid 19 in the 2 weeks post procedure, please call and report this information to [place].    If any biopsies were taken you will be contacted by phone or by letter within the next 1-3 weeks.  Please call us at [phone] if you have not heard about the biopsies in 3 weeks.    SIGNATURES/CONFIDENTIALITY: You and/or your  care partner have signed paperwork which will be entered into your electronic medical record.  These signatures attest to the fact that that the information above on your After Visit Summary has been reviewed and is understood.  Full responsibility of the confidentiality of this discharge information lies with you and/or your care-partner.

## 2021-05-19 NOTE — Progress Notes (Signed)
[facility] History and Physical   Primary Care Physician:  Leanna Battles, MD   Reason for Procedure:   Hx colon polyp  Plan:    colonoscopy     HPI: Cynthia Poole is a 65 y.o. female w/ hx of diminutive adenoma on  last colonoscopy exam in 2013. Here for surveillance.   Past Medical History:  Diagnosis Date   Allergy    Anemia    CAD in native artery 09/29/2016   Coronary angiogram 09/29/16: Dynamic LV systolic function, EF 40%. Left dominant circulation. Mild disease in the ramus and LAD. Circumflex midsegment  90% by IVUS S/P stenting 4.0 x 18 mm Onyx DES.   DDD (degenerative disc disease), cervical    DDD (degenerative disc disease), lumbar    Depression    Fibromyalgia 03/2004   pt denies- states never had Fibro   Heart murmur    History of pleurisy    HLD (hyperlipidemia)    Hodgkin's disease 1997   -no chemo per pt ==  Chest RT and splenectomy   Hypertension    pt denies- when walks BP elevates - no dx of HTN per pt   Hypothyroidism    no longer per pt    Insomnia    Internal and external bleeding hemorrhoids    Migraines    MVP (mitral valve prolapse)    Osteopenia    Personal history of colonic adenoma 06/29/2011   06/29/2011 - diminutive adenoma   Seasonal allergies    Shingles 2018   Vitamin D deficiency     Past Surgical History:  Procedure Laterality Date   BUNIONECTOMY     CARDIAC CATHETERIZATION  09/29/2016   COLONOSCOPY  09/02/2007   redundant colon, external hemorrhoids   COLONOSCOPY  06/29/2011   diminutive polyp, diverticulosis, external hemorrhoids   CORONARY STENT INTERVENTION N/A 09/29/2016   Procedure: Coronary Stent Intervention;  Surgeon: Adrian Prows, MD;  Location: [facility];  Service: Cardiovascular;  Laterality: N/A;   DEEP NECK LYMPH NODE BIOPSY / EXCISION  1996   right LN    ESOPHAGOGASTRODUODENOSCOPY  06/29/2011   notrmal, 54 Fr dilation (dysphagia)   INTRAVASCULAR ULTRASOUND/IVUS N/A 09/29/2016    Procedure: Intravascular Ultrasound/IVUS;  Surgeon: Adrian Prows, MD;  Location: [facility];  Service: Cardiovascular;  Laterality: N/A;   LEFT HEART CATH AND CORONARY ANGIOGRAPHY N/A 09/29/2016   Procedure: Left Heart Cath and Coronary Angiography;  Surgeon: Adrian Prows, MD;  Location: [facility];  Service: Cardiovascular;  Laterality: N/A;   POLYPECTOMY     SPLENECTOMY, TOTAL  1996   STRABISMUS SURGERY  02/2009   left   TONSILLECTOMY     age [age]    Prior to Admission medications   Medication Sig Start Date End Date Taking? Authorizing Provider  ALPRAZolam Duanne Moron) 1 MG tablet Take 1 tablet by mouth at bedtime.  07/25/14  Yes [provider]  amitriptyline (ELAVIL) 25 MG tablet Take 1 tablet by mouth daily. 03/13/20  Yes [provider]  aspirin EC 81 MG EC tablet Take 1 tablet (81 mg total) by mouth daily. 09/30/16  Yes Adrian Prows, MD  atorvastatin (LIPITOR) 20 MG tablet Take 1 tablet (20 mg total) by mouth daily. 04/21/21 07/20/21 Yes Adrian Prows, MD  calcium carbonate (OS-CAL) 600 MG TABS tablet Take 600 mg by mouth daily.   Yes [provider]  Cholecalciferol (VITAMIN D) 2000 UNITS CAPS Take 2,500 Units by mouth daily.    Yes [provider]  DULoxetine HCl (CYMBALTA PO) Take 150 mg by mouth daily.   Yes [provider]  estradiol (VIVELLE-DOT) 0.1 MG/24HR patch Place 1 patch onto the skin 2 (two) times a week. 12/29/20  Yes [provider]  MAGNESIUM CITRATE PO Take 300 mg by mouth daily.   Yes [provider]  Omega-3 1400 MG CAPS Take by mouth daily.   Yes [provider]  progesterone (PROMETRIUM) 100 MG capsule Take 100 mg by mouth at bedtime. 12/29/20  Yes [provider]  REXULTI 1 MG TABS tablet Take 0.5 mg by mouth at bedtime. 10/30/20  Yes [provider]  TURMERIC PO Take 80 mg by mouth daily.   Yes [provider]  metoprolol succinate (TOPROL-XL) 50 MG 24 hr tablet Take 1  tablet (50 mg total) by mouth daily. Take with or immediately following a meal. Patient not taking: Reported on 05/05/2021 04/21/21 07/20/21  Adrian Prows, MD    Current Outpatient Medications  Medication Sig Dispense Refill   ALPRAZolam (XANAX) 1 MG tablet Take 1 tablet by mouth at bedtime.      amitriptyline (ELAVIL) 25 MG tablet Take 1 tablet by mouth daily.     aspirin EC 81 MG EC tablet Take 1 tablet (81 mg total) by mouth daily.     atorvastatin (LIPITOR) 20 MG tablet Take 1 tablet (20 mg total) by mouth daily. 30 tablet 2   calcium carbonate (OS-CAL) 600 MG TABS tablet Take 600 mg by mouth daily.     Cholecalciferol (VITAMIN D) 2000 UNITS CAPS Take 2,500 Units by mouth daily.      DULoxetine HCl (CYMBALTA PO) Take 150 mg by mouth daily.     estradiol (VIVELLE-DOT) 0.1 MG/24HR patch Place 1 patch onto the skin 2 (two) times a week.     MAGNESIUM CITRATE PO Take 300 mg by mouth daily.     Omega-3 1400 MG CAPS Take by mouth daily.     progesterone (PROMETRIUM) 100 MG capsule Take 100 mg by mouth at bedtime.     REXULTI 1 MG TABS tablet Take 0.5 mg by mouth at bedtime.     TURMERIC PO Take 80 mg by mouth daily.     metoprolol succinate (TOPROL-XL) 50 MG 24 hr tablet Take 1 tablet (50 mg total) by mouth daily. Take with or immediately following a meal. (Patient not taking: Reported on 05/05/2021) 30 tablet 2   Current Facility-Administered Medications  Medication Dose Route Frequency Provider Last Rate Last Admin   0.9 %  sodium chloride infusion  500 mL Intravenous Once Gatha Mayer, MD        Allergies as of 05/19/2021 - Review Complete 05/19/2021  Allergen Reaction Noted   Zofran Other (See Comments) 06/29/2011   Citalopram Other (See Comments) 02/25/2021   Doxycycline Other (See Comments) 02/25/2021   Amoxicillin Rash 02/25/2021   Codeine Nausea Only 10/19/2019   Other Other (See Comments) 01/11/2018    Family History  Problem Relation Age of Onset   Arthritis Mother     Aneurysm Father        AAA   Hypertension Father    Tuberculosis Father    Breast cancer Sister        x 2    Breast cancer Maternal Aunt    Breast cancer Maternal Grandmother    Heart disease Maternal Grandfather    Heart disease Paternal Grandfather    Healthy Daughter    Healthy Son  Colon cancer Neg Hx    Esophageal cancer Neg Hx    Colon polyps Neg Hx    Rectal cancer Neg Hx    Stomach cancer Neg Hx     Social History   Socioeconomic History   Marital status: Divorced    Spouse name: Not on file   Number of children: 2   Years of education: 16   Highest education level: Not on file  Occupational History   Occupation: Geophysical data processor  Tobacco Use   Smoking status: Never   Smokeless tobacco: Never  Vaping Use   Vaping Use: Never used  Substance and Sexual Activity   Alcohol use: Yes    Alcohol/week: 7.0 standard drinks    Types: 7 Glasses of wine per week    Comment: occasionally   Drug use: No   Sexual activity: Not on file  Other Topics Concern   Not on file  Social History Narrative   Caffeine daily    Lives at home alone   Right handed      Review of Systems:  All other review of systems negative except as mentioned in the HPI.  Physical Exam: Vital signs BP 129/81   Pulse 80   Temp 98.7 F (37.1 C) (Temporal)   Ht 5\' 6"  (1.676 m)   Wt 160 lb (72.6 kg)   SpO2 99%   BMI 25.82 kg/m   General:   Alert,  Well-developed, well-nourished, pleasant and cooperative in NAD Lungs:  Clear throughout to auscultation.   Heart:  Regular rate and rhythm; no murmurs, clicks, rubs,  or gallops. Abdomen:  Soft, nontender and nondistended. Normal bowel sounds.   Neuro/Psych:  Alert and cooperative. Normal mood and affect. A and O x 3   @Desyre Calma  Simonne Maffucci, MD, [facility] [phone] (pager) 05/19/2021 11:22 AM@

## 2021-05-19 NOTE — Op Note (Signed)
[facility] Patient Name: Cynthia Poole Procedure Date: 05/19/2021 11:22 AM MRN: [id number] Endoscopist: Gatha Mayer , MD Age: 65 Referring MD:  Date of Birth: 30-Sep-1955 Gender: Female Account #: [id number] Procedure:                Colonoscopy Indications:              Surveillance: Personal history of adenomatous                            polyps on last colonoscopy > 5 years ago, Last                            colonoscopy: 2013 Medicines:                Propofol per Anesthesia, Monitored Anesthesia Care Procedure:                Pre-Anesthesia Assessment:                           - Prior to the procedure, a History and Physical                            was performed, and patient medications and                            allergies were reviewed. The patient's tolerance of                            previous anesthesia was also reviewed. The risks                            and benefits of the procedure and the sedation                            options and risks were discussed with the patient.                            All questions were answered, and informed consent                            was obtained. Prior Anticoagulants: The patient has                            taken no previous anticoagulant or antiplatelet                            agents. ASA Grade Assessment: III - A patient with                            severe systemic disease. After reviewing the risks                            and benefits, the patient was deemed in  satisfactory condition to undergo the procedure.                           After obtaining informed consent, the colonoscope                            was passed under direct vision. Throughout the                            procedure, the patient's blood pressure, pulse, and                            oxygen saturations were monitored continuously. The                            Colonoscope was  introduced through the anus and                            advanced to the the cecum, identified by                            appendiceal orifice and ileocecal valve. The                            colonoscopy was somewhat difficult due to a                            redundant colon. Successful completion of the                            procedure was aided by straightening and shortening                            the scope to obtain bowel loop reduction. The                            patient tolerated the procedure well. The quality                            of the bowel preparation was good. The ileocecal                            valve, appendiceal orifice, and rectum were                            photographed. The bowel preparation used was                            Miralax via split dose instruction. Scope In: 11:30:39 AM Scope Out: 11:51:26 AM Scope Withdrawal Time: 0 hours 13 minutes 31 seconds  Total Procedure Duration: 0 hours 20 minutes 47 seconds  Findings:                 The perianal and digital rectal examinations were  normal.                           Multiple diverticula were found in the sigmoid                            colon.                           The colon (entire examined portion) was mildly                            redundant. Advancing the scope required                            straightening and shortening the scope to obtain                            bowel loop reduction.                           The exam was otherwise without abnormality on                            direct and retroflexion views. Complications:            No immediate complications. Estimated Blood Loss:     Estimated blood loss: none. Impression:               - Diverticulosis in the sigmoid colon.                           - Redundant colon.                           - The examination was otherwise normal on direct                             and retroflexion views.                           - No specimens collected.                           - Personal history of colonic polyp - diminutive                            adenoma 2013. Recommendation:           - Patient has a contact number available for                            emergencies. The signs and symptoms of potential                            delayed complications were discussed with the  patient. Return to normal activities tomorrow.                            Written discharge instructions were provided to the                            patient.                           - Resume previous diet.                           - Continue present medications.                           - Repeat colonoscopy in 10 years. Gatha Mayer, MD 05/19/2021 11:58:07 AM This report has been signed electronically.

## 2021-05-19 NOTE — Progress Notes (Signed)
No problems noted in the recovery room. maw 

## 2021-05-21 ENCOUNTER — Telehealth: Payer: Self-pay

## 2021-05-21 ENCOUNTER — Telehealth: Payer: Self-pay | Admitting: *Deleted

## 2021-05-21 NOTE — Telephone Encounter (Signed)
  Follow up Call-  Call back number 05/19/2021  Post procedure Call Back phone  # [phone]  Permission to leave phone message Yes  Some recent data might be hidden      Follow up call attempted, no answer,left msg

## 2021-05-21 NOTE — Telephone Encounter (Signed)
  Follow up Call-  Call back number 05/19/2021  Post procedure Call Back phone  # [phone]  Permission to leave phone message Yes  Some recent data might be hidden    LMOM to call back with any questions or concerns.  Also, call back if patient has developed fever, respiratory issues or been dx with COVID or had any family members or close contacts diagnosed since her procedure.

## 2021-06-09 ENCOUNTER — Other Ambulatory Visit: Payer: Self-pay | Admitting: Internal Medicine

## 2021-06-10 LAB — SARS CORONAVIRUS 2 (TAT 6-24 HRS): SARS Coronavirus 2: NEGATIVE

## 2021-06-11 ENCOUNTER — Ambulatory Visit (HOSPITAL_COMMUNITY)
Admission: RE | Admit: 2021-06-11 | Discharge: 2021-06-11 | Disposition: A | Discharge status: Home / Self Care | Payer: Medicare Other | Source: Ambulatory Visit | Attending: Internal Medicine | Admitting: Internal Medicine

## 2021-06-11 ENCOUNTER — Other Ambulatory Visit: Payer: Self-pay

## 2021-06-11 DIAGNOSIS — R0609 Other forms of dyspnea: Secondary | ICD-10-CM | POA: Diagnosis present

## 2021-06-11 LAB — PULMONARY FUNCTION TEST
DL/VA % pred: 71 %
DL/VA: 2.93 ml/min/mmHg/L
DLCO unc % pred: 58 %
DLCO unc: 12.39 ml/min/mmHg
FEF 25-75 Post: 1.93 L/sec
FEF 25-75 Pre: 2.01 L/sec
FEF2575-%Change-Post: -3 %
FEF2575-%Pred-Post: 85 %
FEF2575-%Pred-Pre: 89 %
FEV1-%Change-Post: 0 %
FEV1-%Pred-Post: 91 %
FEV1-%Pred-Pre: 90 %
FEV1-Post: 2.38 L
FEV1-Pre: 2.36 L
FEV1FVC-%Change-Post: 1 %
FEV1FVC-%Pred-Pre: 98 %
FEV6-%Change-Post: 0 %
FEV6-%Pred-Post: 94 %
FEV6-%Pred-Pre: 94 %
FEV6-Post: 3.08 L
FEV6-Pre: 3.11 L
FEV6FVC-%Change-Post: 0 %
FEV6FVC-%Pred-Post: 103 %
FEV6FVC-%Pred-Pre: 104 %
FVC-%Change-Post: 0 %
FVC-%Pred-Post: 90 %
FVC-%Pred-Pre: 91 %
FVC-Post: 3.1 L
FVC-Pre: 3.11 L
Post FEV1/FVC ratio: 77 %
Post FEV6/FVC ratio: 100 %
Pre FEV1/FVC ratio: 76 %
Pre FEV6/FVC Ratio: 100 %
RV % pred: 58 %
RV: 1.29 L
TLC % pred: 84 %
TLC: 4.53 L

## 2021-06-11 MED ORDER — ALBUTEROL SULFATE (2.5 MG/3ML) 0.083% IN NEBU
2.5000 mg | INHALATION_SOLUTION | Freq: Once | RESPIRATORY_TRACT | Status: AC
  Administered 2021-06-11: 11:00:00 2.5 mg via RESPIRATORY_TRACT

## 2021-06-17 ENCOUNTER — Other Ambulatory Visit: Payer: Self-pay | Admitting: Obstetrics & Gynecology

## 2021-06-17 DIAGNOSIS — R928 Other abnormal and inconclusive findings on diagnostic imaging of breast: Secondary | ICD-10-CM

## 2021-07-02 ENCOUNTER — Ambulatory Visit
Admission: RE | Admit: 2021-07-02 | Discharge: 2021-07-02 | Disposition: A | Discharge status: Home / Self Care | Payer: Medicare Other | Source: Ambulatory Visit | Attending: Obstetrics & Gynecology | Admitting: Obstetrics & Gynecology

## 2021-07-02 ENCOUNTER — Other Ambulatory Visit: Payer: Self-pay

## 2021-07-02 DIAGNOSIS — R928 Other abnormal and inconclusive findings on diagnostic imaging of breast: Secondary | ICD-10-CM

## 2021-07-14 ENCOUNTER — Other Ambulatory Visit: Payer: Medicare Other

## 2021-07-31 ENCOUNTER — Other Ambulatory Visit: Payer: Self-pay | Admitting: Cardiology

## 2021-07-31 DIAGNOSIS — E7800 Pure hypercholesterolemia, unspecified: Secondary | ICD-10-CM

## 2021-07-31 DIAGNOSIS — I25118 Atherosclerotic heart disease of native coronary artery with other forms of angina pectoris: Secondary | ICD-10-CM

## 2021-08-26 ENCOUNTER — Other Ambulatory Visit: Payer: BLUE CROSS/BLUE SHIELD

## 2021-09-02 ENCOUNTER — Other Ambulatory Visit: Payer: Self-pay

## 2021-09-02 ENCOUNTER — Ambulatory Visit: Payer: Medicare Other

## 2021-09-02 DIAGNOSIS — I6522 Occlusion and stenosis of left carotid artery: Secondary | ICD-10-CM

## 2021-09-04 ENCOUNTER — Ambulatory Visit: Payer: BLUE CROSS/BLUE SHIELD | Admitting: Cardiology

## 2021-09-17 ENCOUNTER — Ambulatory Visit: Payer: Medicare Other | Admitting: Cardiology

## 2021-09-17 ENCOUNTER — Encounter: Payer: Self-pay | Admitting: Cardiology

## 2021-09-17 ENCOUNTER — Other Ambulatory Visit: Payer: Self-pay

## 2021-09-17 VITALS — BP 120/73 | HR 89 | Temp 98.7°F | Resp 16 | Ht 66.0 in | Wt 171.0 lb

## 2021-09-17 DIAGNOSIS — I6522 Occlusion and stenosis of left carotid artery: Secondary | ICD-10-CM

## 2021-09-17 DIAGNOSIS — R42 Dizziness and giddiness: Secondary | ICD-10-CM

## 2021-09-17 DIAGNOSIS — I25118 Atherosclerotic heart disease of native coronary artery with other forms of angina pectoris: Secondary | ICD-10-CM

## 2021-09-17 DIAGNOSIS — E7800 Pure hypercholesterolemia, unspecified: Secondary | ICD-10-CM

## 2021-09-17 NOTE — Progress Notes (Signed)
? ?Primary Physician/Referring:  Donnajean Lopes, MD ? ?Patient ID: Cynthia Poole, female    DOB: Nov 17, 1955, 66 y.o.   MRN: [id number] ? ?Chief Complaint  ?Patient presents with  ? Coronary Artery Disease  ? Follow-up  ?  6 month  ? ?HPI:   ? ?Cynthia Poole  is a 67 y.o. Caucasian female with hyperlipidemia, coronary artery disease status post PCI to the dominant circumflex in 2018, LBBB, history of Hodgkin's lymphoma 1997 status post chemotherapy and radiation therapy to chest presents for annual f/u. ? ?She continues to have dyspnea on exertion, fatigue and occasional palpitations.  No chest pain, syncope.  I had started her on low-dose statins with pravastatin 20 mg in the evening and also had started her on Repatha, when I saw her 6 weeks ago.  Patient did not continue with Repatha stating that local site irritation bothers her.   ? ?She continues to have dyspnea on exertion, fatigue, frequent dizziness when she stands up and occasional palpitations.  No chest pain, syncope. ? ?Past Medical History:  ?Diagnosis Date  ? Allergy   ? Anemia   ? CAD in native artery 09/29/2016  ? Coronary angiogram 09/29/16: Dynamic LV systolic function, EF 63%. Left dominant circulation. Mild disease in the ramus and LAD. Circumflex midsegment  90% by IVUS S/P stenting 4.0 x 18 mm Onyx DES.  ? DDD (degenerative disc disease), cervical   ? DDD (degenerative disc disease), lumbar   ? Depression   ? Heart murmur   ? History of pleurisy   ? HLD (hyperlipidemia)   ? Hodgkin's disease 1997  ? -no chemo per pt ==  Chest RT and splenectomy  ? Hypertension   ? pt denies- when walks BP elevates - no dx of HTN per pt  ? Hypothyroidism   ? no longer per pt   ? Insomnia   ? Internal and external bleeding hemorrhoids   ? Migraines   ? MVP (mitral valve prolapse)   ? Osteopenia   ? Personal history of colonic adenoma 06/29/2011  ? 06/29/2011 - diminutive adenoma  ? Seasonal allergies   ? Shingles 2018  ? Vitamin D deficiency   ? ?Social History   ? ?Tobacco Use  ? Smoking status: Never  ? Smokeless tobacco: Never  ?Substance Use Topics  ? Alcohol use: Yes  ?  Alcohol/week: 7.0 standard drinks  ?  Types: 7 Glasses of wine per week  ?  Comment: occasionally  ?Marital Status: Divorced  ? ?ROS  ?Review of Systems  ?Cardiovascular:  Negative for chest pain, dyspnea on exertion and leg swelling.  ?Neurological:  Positive for dizziness.  ?Objective  ?Blood pressure 120/73, pulse 89, temperature 98.7 ?F (37.1 ?C), temperature source Temporal, resp. rate 16, height '5\' 6"'  (1.676 m), weight 171 lb (77.6 kg), SpO2 99 %.  ? ?  09/17/2021  ? 12:07 PM 05/19/2021  ? 12:13 PM 05/19/2021  ? 12:03 PM  ?Vitals with BMI  ?Height '5\' 6"'     ?Weight 171 lbs    ?BMI 27.61    ?Systolic 875 643 329  ?Diastolic 73 84 75  ?Pulse 89 87 92  ?  ?No data found.  ? Physical Exam ?Neck:  ?   Vascular: No carotid bruit.  ?Cardiovascular:  ?   Rate and Rhythm: Normal rate and regular rhythm.  ?   Pulses: Normal pulses and intact distal pulses.     ?     Carotid pulses are  on  the right side with bruit and  on the left side with bruit. ?   Heart sounds: A midsystolic click. Murmur heard.  ?Crescendo crescendo-decrescendo mid to late systolic murmur is present with a grade of 2/6 at the apex.  ?  No gallop.  ?Pulmonary:  ?   Effort: Pulmonary effort is normal.  ?   Breath sounds: Normal breath sounds.  ?Abdominal:  ?   General: Bowel sounds are normal.  ?   Palpations: Abdomen is soft.  ?Musculoskeletal:  ?   Right lower leg: No edema.  ?   Left lower leg: No edema.  ?Skin: ?   General: Skin is warm.  ? ?Laboratory examination:  ? ?Recent Labs  ?  01/14/21 ?0946  ?NA 134  ?K 4.7  ?CL 98  ?CO2 25  ?GLUCOSE 92  ?BUN 12  ?CREATININE 0.97  ?CALCIUM 9.2  ? ?CrCl cannot be calculated (Patient's most recent lab result is older than the maximum 21 days allowed.).  ? ?  Latest Ref Rng & Units 01/14/2021  ?  9:46 AM 09/30/2016  ?  2:47 AM 09/29/2016  ? 10:44 AM  ?CMP  ?Glucose 65 - 99 mg/dL 92   109   168     ?BUN 8 - 27 mg/dL '12   11   9    ' ?Creatinine 0.57 - 1.00 mg/dL 0.97   0.84   0.85    ?Sodium 134 - 144 mmol/L 134   136   135    ?Potassium 3.5 - 5.2 mmol/L 4.7   5.4   3.9    ?Chloride 96 - 106 mmol/L 98   103   101    ?CO2 20 - 29 mmol/L '25   26   24    ' ?Calcium 8.7 - 10.3 mg/dL 9.2   9.5   9.2    ?Total Protein 6.0 - 8.5 g/dL 6.8      ?Total Bilirubin 0.0 - 1.2 mg/dL 0.3      ?Alkaline Phos 44 - 121 IU/L 81      ?AST 0 - 40 IU/L 22      ?ALT 0 - 32 IU/L 14      ? ? ?  Latest Ref Rng & Units 09/30/2016  ?  2:47 AM 09/29/2016  ? 10:44 AM 06/15/2011  ?  4:48 AM  ?CBC  ?WBC 4.0 - 10.5 K/uL 8.2   6.1   5.4    ?Hemoglobin 12.0 - 15.0 g/dL 11.4   12.3   10.7    ?Hematocrit 36.0 - 46.0 % 33.8   36.7   31.8    ?Platelets 150 - 400 K/uL 284   287   266    ? ?Lipid Panel ?Recent Labs  ?  01/14/21 ?0945 01/14/21 ?3832 04/17/21 ?9191  ?CHOL 219*  --  206*  ?TRIG 99  --  91  ?LDLCALC 137*  --  129*  ?HDL 65  --  61  ?LDLDIRECT  --  137*  --   ? ?HEMOGLOBIN A1C ?Lab Results  ?Component Value Date  ? HGBA1C 5.5 09/30/2016  ? MPG 111 09/30/2016  ? ?TSH ?Recent Labs  ?  01/14/21 ?0945  ?TSH 3.670  ? ?Labs 03/24/2019:  ? ?02/19/2021: ? ?Vitamin D 28.7.  Free T4 normal.  TSH normal. ? ?Total cholesterol 173, triglycerides 76, HDL 61, LDL 97.  Non-HDL cholesterol 112. ? ?Serum glucose '1 1 2 ' mg, BUN 12, creatinine 0.9, EGFR 62 mL, potassium 4.4, sodium  133, CMP otherwise normal. ? ?Hb 12.4/HCT 35.8, platelets 271.  ? ?Medications and allergies  ? ?Allergies  ?Allergen Reactions  ? Zofran Other (See Comments)  ?  zofran causes drop in blood pressure  ? Citalopram Other (See Comments)  ? Doxycycline Other (See Comments)  ? Amoxicillin Rash  ? Codeine Nausea Only  ? Other Other (See Comments)  ?  OPIATES & CODEINE CAUSE GI UPSET  ?  ?Current Outpatient Medications:  ?  ALPRAZolam (XANAX) 1 MG tablet, Take 1 tablet by mouth at bedtime. , Disp: , Rfl:  ?  amitriptyline (ELAVIL) 25 MG tablet, Take 1 tablet by mouth daily., Disp: , Rfl:  ?   aspirin EC 81 MG EC tablet, Take 1 tablet (81 mg total) by mouth daily., Disp: , Rfl:  ?  atorvastatin (LIPITOR) 20 MG tablet, TAKE 1 TABLET(20 MG) BY MOUTH DAILY, Disp: 30 tablet, Rfl: 2 ?  calcium carbonate (OS-CAL) 600 MG TABS tablet, Take 600 mg by mouth daily., Disp: , Rfl:  ?  Cholecalciferol (VITAMIN D) 2000 UNITS CAPS, Take 2,500 Units by mouth daily. , Disp: , Rfl:  ?  estradiol (VIVELLE-DOT) 0.1 MG/24HR patch, Place 1 patch onto the skin 2 (two) times a week., Disp: , Rfl:  ?  MAGNESIUM CITRATE PO, Take 300 mg by mouth daily., Disp: , Rfl:  ?  metoprolol succinate (TOPROL-XL) 50 MG 24 hr tablet, TAKE 1 TABLET(50 MG) BY MOUTH DAILY WITH OR IMMEDIATELY FOLLOWING A MEAL, Disp: 30 tablet, Rfl: 2 ?  Omega-3 1400 MG CAPS, Take by mouth daily., Disp: , Rfl:  ?  progesterone (PROMETRIUM) 100 MG capsule, Take 100 mg by mouth at bedtime., Disp: , Rfl:  ?  TURMERIC PO, Take 80 mg by mouth daily., Disp: , Rfl:   ? ?Radiology:  ? ?Aortic atherosclerosis (I70.0)   CT of the chest without contrast 11/10/2016 [facility]):  ?Mild to moderate coronary artery calcification, normal thoracic aorta, mild calcification of the aortic valve and aortic root. Fibrotic changes in both upper lobes likely related to prior radiation therapy. ? ? VQ Scan at [facility] 11/10/2016: Negative for PE. ? ?Cardiac Studies:  ? ?Coronary angiogram 09/29/16: Dynamic LV systolic function, EF 82%. Left dominant circulation. Mild disease in the ramus and LAD. Circumflex midsegment 90% stenosis by intravascular ultrasound. Successful stenting with 4.0 x 18 mm Onyx DES.  ? ?Event monitor 07/21/2019: ?Normal sinus rhythm.  No significant arrhythmias.  Preliminary report. No significant change from  06/18/17 (Occasional PVC and PAC. ? ?Lexiscan (Walking with Jaci Carrel) Sestamibi Stress Test 07/17/2019: ?Non-diagnostic ECG stress. Resting EKG/ECG demonstrated normal sinus rhythm with left bundle branch block. Peak EKG/ECG revealed no significant  ST-T change from baseline abnormality. ?There is a fixed mild defect in the septal and apical regions. This defects can be present in LBBB. Also there is mild soft tissue attenuation in this region. Stress L

## 2021-12-17 ENCOUNTER — Ambulatory Visit: Payer: BLUE CROSS/BLUE SHIELD | Admitting: Cardiology

## 2022-01-04 ENCOUNTER — Other Ambulatory Visit: Payer: Self-pay | Admitting: Cardiology

## 2022-01-04 DIAGNOSIS — E7800 Pure hypercholesterolemia, unspecified: Secondary | ICD-10-CM

## 2022-01-21 ENCOUNTER — Encounter: Payer: Self-pay | Admitting: Cardiology

## 2022-01-21 ENCOUNTER — Ambulatory Visit: Payer: Medicare Other | Admitting: Cardiology

## 2022-01-21 VITALS — BP 123/60 | HR 81 | Temp 98.0°F | Resp 16 | Ht 66.0 in | Wt 171.0 lb

## 2022-01-21 DIAGNOSIS — I340 Nonrheumatic mitral (valve) insufficiency: Secondary | ICD-10-CM

## 2022-01-21 DIAGNOSIS — I25118 Atherosclerotic heart disease of native coronary artery with other forms of angina pectoris: Secondary | ICD-10-CM

## 2022-01-21 DIAGNOSIS — I6522 Occlusion and stenosis of left carotid artery: Secondary | ICD-10-CM

## 2022-01-21 DIAGNOSIS — E7800 Pure hypercholesterolemia, unspecified: Secondary | ICD-10-CM

## 2022-01-21 LAB — LIPID PANEL WITH LDL/HDL RATIO
Cholesterol, Total: 193 mg/dL (ref 100–199)
HDL: 70 mg/dL (ref >39)
LDL Chol Calc (NIH): 103 mg/dL — ABNORMAL HIGH (ref 0–99)
LDL/HDL Ratio: 1.5 ratio (ref 0.0–3.2)
Triglycerides: 117 mg/dL (ref 0–149)
VLDL Cholesterol Cal: 20 mg/dL (ref 5–40)

## 2022-01-21 NOTE — Progress Notes (Signed)
Primary Physician/Referring:  Donnajean Lopes, MD  Patient ID: Cynthia Poole, female    DOB: 1955-10-11, 66 y.o.   MRN: [id number]  Chief Complaint  Patient presents with   Coronary Artery Disease   Hyperlipidemia   Follow-up    3 month   HPI:    Cynthia Poole  is a 66 y.o. Caucasian female with hyperlipidemia, coronary artery disease status post PCI to the dominant circumflex in 2018, LBBB, history of Hodgkin's lymphoma 1997 status post chemotherapy and radiation therapy to chest presents for 9-monthf/u.  Patient was seen by me for hyperlipidemia and coronary artery disease follow-up, she had discontinued statins.  Now she is back on a statin Lipitor 10 mg daily. Patient did not continue with Repatha stating that local site irritation bothers her.    She continues to have  frequent dizziness when she walks up a flight of stairs but continues to exercise with no symptoms.  No chest pain, syncope.  Past Medical History:  Diagnosis Date   Allergy    Anemia    CAD in native artery 09/29/2016   Coronary angiogram 09/29/16: Dynamic LV systolic function, EF 616% Left dominant circulation. Mild disease in the ramus and LAD. Circumflex midsegment  90% by IVUS S/P stenting 4.0 x 18 mm Onyx DES.   DDD (degenerative disc disease), cervical    DDD (degenerative disc disease), lumbar    Depression    Heart murmur    History of pleurisy    HLD (hyperlipidemia)    Hodgkin's disease 1997   -no chemo per pt ==  Chest RT and splenectomy   Hypertension    pt denies- when walks BP elevates - no dx of HTN per pt   Hypothyroidism    no longer per pt    Insomnia    Internal and external bleeding hemorrhoids    Migraines    MVP (mitral valve prolapse)    Osteopenia    Personal history of colonic adenoma 06/29/2011   06/29/2011 - diminutive adenoma   Seasonal allergies    Shingles 2018   Vitamin D deficiency    Social History   Tobacco Use   Smoking status: Never   Smokeless tobacco:  Never  Substance Use Topics   Alcohol use: Yes    Alcohol/week: 7.0 standard drinks of alcohol    Types: 7 Glasses of wine per week    Comment: occasionally  Marital Status: Divorced   ROS  Review of Systems  Cardiovascular:  Negative for chest pain, dyspnea on exertion and leg swelling.  Neurological:  Positive for dizziness (with walking the stairs).   Objective  Blood pressure 123/60, pulse 81, temperature 98 F (36.7 C), resp. rate 16, height _0  (1.676 m), weight 171 lb (77.6 kg), SpO2 99 %.     01/21/2022    9:38 AM 09/17/2021   12:07 PM 05/19/2021   12:13 PM  Vitals with BMI  Height _1  _2    Weight 171 lbs 171 lbs   BMI 294.50238.88  Systolic [id number] Diastolic 60 73 84  Pulse 81 89 87    No data found.   Physical Exam Neck:     Vascular: No carotid bruit.  Cardiovascular:     Rate and Rhythm: Normal rate and regular rhythm.     Pulses: Normal pulses and intact distal pulses.          Carotid pulses are  on the right side  with bruit and  on the left side with bruit.    Heart sounds: A midsystolic click. Murmur heard.     Mid to late systolic murmur is present with a grade of 2/6 at the apex.     No gallop.  Pulmonary:     Effort: Pulmonary effort is normal.     Breath sounds: Normal breath sounds.  Abdominal:     General: Bowel sounds are normal.     Palpations: Abdomen is soft.  Musculoskeletal:     Right lower leg: No edema.     Left lower leg: No edema.  Skin:    General: Skin is warm.    Laboratory examination:    CrCl cannot be calculated (Patient's most recent lab result is older than the maximum 21 days allowed.).     Latest Ref Rng & Units 01/14/2021    9:46 AM 09/30/2016    2:47 AM 09/29/2016   10:44 AM  CMP  Glucose 65 - 99 mg/dL 92  109  168   BUN 8 - 27 mg/dL _0 Creatinine 0.57 - 1.00 mg/dL 0.97  0.84  0.85   Sodium 134 - 144 mmol/L 134  136  135   Potassium 3.5 - 5.2 mmol/L 4.7  5.4  3.9   Chloride 96 - 106 mmol/L  98  103  101   CO2 20 - 29 mmol/L _1 Calcium 8.7 - 10.3 mg/dL 9.2  9.5  9.2   Total Protein 6.0 - 8.5 g/dL 6.8     Total Bilirubin 0.0 - 1.2 mg/dL 0.3     Alkaline Phos 44 - 121 IU/L 81     AST 0 - 40 IU/L 22     ALT 0 - 32 IU/L 14         Latest Ref Rng & Units 09/30/2016    2:47 AM 09/29/2016   10:44 AM 06/15/2011    4:48 AM  CBC  WBC 4.0 - 10.5 K/uL 8.2  6.1  5.4   Hemoglobin 12.0 - 15.0 g/dL 11.4  12.3  10.7   Hematocrit 36.0 - 46.0 % 33.8  36.7  31.8   Platelets 150 - 400 K/uL 284  287  266    Lipid Panel Recent Labs    04/17/21 0923 01/20/22 0831  CHOL 206* 193  TRIG 91 117  LDLCALC 129* 103*  HDL 61 70   HEMOGLOBIN A1C Lab Results  Component Value Date   HGBA1C 5.5 09/30/2016   MPG 111 09/30/2016   TSH No results for input(s): "TSH" in the last 8760 hours.  Labs 03/24/2019:   02/19/2021:  Vitamin D 28.7.  Free T4 normal.  TSH normal.  Total cholesterol 173, triglycerides 76, HDL 61, LDL 97.  Non-HDL cholesterol 112.  Serum glucose _2 mg, BUN 12, creatinine 0.9, EGFR 62 mL, potassium 4.4, sodium 133, CMP otherwise normal.  Hb 12.4/HCT 35.8, platelets 271.   Medications and allergies   Allergies  Allergen Reactions   Zofran Other (See Comments)    zofran causes drop in blood pressure   Citalopram Other (See Comments)   Doxycycline Other (See Comments)   Amoxicillin Rash   Codeine Nausea Only   Other Other (See Comments)    OPIATES & CODEINE CAUSE GI UPSET    Current Outpatient Medications:    ALPRAZolam (XANAX) 1 MG tablet, Take 1 tablet by mouth at bedtime. , Disp: , Rfl:  amitriptyline (ELAVIL) 25 MG tablet, Take 1 tablet by mouth daily., Disp: , Rfl:    aspirin EC 81 MG EC tablet, Take 1 tablet (81 mg total) by mouth daily., Disp: , Rfl:    atorvastatin (LIPITOR) 20 MG tablet, TAKE 1 TABLET(20 MG) BY MOUTH DAILY, Disp: 30 tablet, Rfl: 2   calcium carbonate (OS-CAL) 600 MG TABS tablet, Take 600 mg by mouth daily., Disp: , Rfl:     Cholecalciferol (VITAMIN D) 2000 UNITS CAPS, Take 2,500 Units by mouth daily. , Disp: , Rfl:    FLUoxetine (PROZAC) 20 MG capsule, Take 60 mg by mouth every morning., Disp: , Rfl:    MAGNESIUM CITRATE PO, Take 300 mg by mouth daily., Disp: , Rfl:    Omega-3 1400 MG CAPS, Take by mouth daily., Disp: , Rfl:    TURMERIC PO, Take 80 mg by mouth daily., Disp: , Rfl:    Radiology:   Aortic atherosclerosis (I70.0)   CT of the chest without contrast 11/10/2016 [facility]):  Mild to moderate coronary artery calcification, normal thoracic aorta, mild calcification of the aortic valve and aortic root. Fibrotic changes in both upper lobes likely related to prior radiation therapy.   VQ Scan at [facility] 11/10/2016: Negative for PE.  Cardiac Studies:   Coronary angiogram 09/29/16: Dynamic LV systolic function, EF 62%. Left dominant circulation. Mild disease in the ramus and LAD. Circumflex midsegment 90% stenosis by intravascular ultrasound. Successful stenting with 4.0 x 18 mm Onyx DES.   Event monitor 07/21/2019: Normal sinus rhythm.  No significant arrhythmias.  Preliminary report. No significant change from  06/18/17 (Occasional PVC and PAC.  Lexiscan (Walking with Jaci Carrel) Sestamibi Stress Test 07/17/2019: Non-diagnostic ECG stress. Resting EKG/ECG demonstrated normal sinus rhythm with left bundle branch block. Peak EKG/ECG revealed no significant ST-T change from baseline abnormality. There is a fixed mild defect in the septal and apical regions. This defects can be present in LBBB. Also there is mild soft tissue attenuation in this region. Stress LV EF: 58%.  No wall motion abnormality.  Low risk study. No previous exam available for comparison.   PCV ECHOCARDIOGRAM COMPLETE 02/11/2021 Left ventricle cavity is normal in size. Mild concentric hypertrophy of the left ventricle. Normal global wall motion. Normal LV systolic function with EF 55%. Doppler evidence of grade I (impaired)  diastolic dysfunction, normal LAP. Aneurysmal interatrial septum without 2D or color Doppler evidence of shunting. Trileaflet aortic valve.  Moderate (Grade II) aortic regurgitation. Moderate (Grade II) mitral regurgitation. Mild to moderate tricuspid regurgitation. Mild pulmonic regurgitation. No evidence of pulmonary hypertension. Study on 07/21/2019 showed mild AI. mild TR.  PCV CARDIAC STRESS TEST 04/18/2021  Exercise treadmill stress test performed using Bruce protocol.  Patient reached 8.8 METS, and 110% of age predicted maximum heart rate.  Exercise capacity was good.  No chest pain reported. Dyspnea, dizziness reported. Normal heart rate and hemodynamic response. Peak stress EKG showed sinus tachycardia 150 bpm, LBBB. Ischemia evaluation limited due to LBBB. Consider alternate ischemia testing, if clinical suspicion is high.   Carotid artery duplex 09/02/2021: No evidence of significant stenosis in the right carotid vessels. Duplex suggests stenosis in the left internal carotid artery (16-49%). Antegrade right vertebral artery flow. Antegrade left vertebral artery flow. Mild regression of disease compared to prior study 02/11/2021. Follow up in one year is appropriate if clinically indicated.  EKG:    EKG 09/17/2021: Normal sinus rhythm at rate of 81 bpm, left bundle branch block.  No further analysis.  No significant change from EKG 04/11/2020   Assessment     ICD-10-CM   1. Coronary artery disease of native artery of native heart with stable angina pectoris ([place])  I25.118 PCV CARDIAC STRESS TEST    2. Pure hypercholesterolemia  E78.00     3. Asymptomatic stenosis of left carotid artery without infarction  I65.22     4. Moderate mitral regurgitation  I34.0      No orders of the defined types were placed in this encounter.   Medications Discontinued During This Encounter  Medication Reason   estradiol (VIVELLE-DOT) 0.1 MG/24HR patch    metoprolol succinate (TOPROL-XL) 50  MG 24 hr tablet    progesterone (PROMETRIUM) 100 MG capsule      Recommendations:   Cynthia Poole  is a 66 y.o. Caucasian female with hyperlipidemia, coronary artery disease status post PCI to the dominant circumflex in 2018, LBBB, history of Hodgkin's lymphoma 1997 status post chemotherapy and radiation therapy to chest presents for 63-monthf/u.  Patient was seen by me for hyperlipidemia and coronary artery disease follow-up, she had discontinued statins.  Now she is back on a statin Lipitor 10 mg daily, reviewed the results of the lipids which is certainly improved but still not at goal.  I wanted to add Zetia 10 mg daily, patient does not want to make any changes.  She continues to complain of dizziness when she walks up a flight of stairs.  However she is still able to exercise on a regular basis without chest pain or dyspnea or dizziness.  Presentation is confusing, I will schedule him for a routine treadmill exercise stress test to evaluate her symptomatology although she has underlying left bundle branch block and EKG will be nondiagnostic I would like to obtain hemodynamic data.  Blood pressure is well controlled, no change in mitral regurgitation murmur, I will see her back in a year or sooner if problems.   JAdrian Prows MD, [facility]8/07/2021, 9:11 PM Office: [phone]: [phone]

## 2022-01-28 ENCOUNTER — Other Ambulatory Visit: Payer: Self-pay | Admitting: Cardiology

## 2022-01-28 DIAGNOSIS — E7800 Pure hypercholesterolemia, unspecified: Secondary | ICD-10-CM

## 2022-04-03 ENCOUNTER — Other Ambulatory Visit: Payer: Self-pay | Admitting: Cardiology

## 2022-04-03 ENCOUNTER — Ambulatory Visit: Payer: Medicare Other

## 2022-04-03 DIAGNOSIS — I10 Essential (primary) hypertension: Secondary | ICD-10-CM

## 2022-04-03 DIAGNOSIS — I25118 Atherosclerotic heart disease of native coronary artery with other forms of angina pectoris: Secondary | ICD-10-CM

## 2022-04-03 MED ORDER — LOSARTAN POTASSIUM 50 MG PO TABS: 50.0000 mg | ORAL_TABLET | Freq: Every day | ORAL | 2 refills | Status: DC

## 2022-04-03 NOTE — Progress Notes (Addendum)
Hypertensive blood pressure response, discussed with the patient, I will start her on losartan 50 mg daily, will obtain BMP in 2 to 3 weeks.    ICD-10-CM   1. Systolic essential hypertension  I10 losartan (COZAAR) 50 MG tablet    Basic metabolic panel    2. Coronary artery disease of native artery of native heart with stable angina pectoris ([facility])  I25.118 PCV CARDIAC STRESS TEST    losartan (COZAAR) 50 MG tablet      Orders Placed This Encounter  Procedures   Basic metabolic panel    Meds ordered this encounter  Medications   losartan (COZAAR) 50 MG tablet    Sig: Take 1 tablet (50 mg total) by mouth daily.    Dispense:  30 tablet    Refill:  2

## 2022-04-06 ENCOUNTER — Telehealth: Payer: Self-pay

## 2022-04-06 DIAGNOSIS — I10 Essential (primary) hypertension: Secondary | ICD-10-CM

## 2022-04-06 MED ORDER — AMLODIPINE BESYLATE 5 MG PO TABS: 5.0000 mg | ORAL_TABLET | ORAL | 2 refills | Status: DC

## 2022-04-06 NOTE — Telephone Encounter (Signed)
ICD-10-CM   1. Primary hypertension  I10 amLODipine (NORVASC) 5 MG tablet        Meds ordered this encounter  Medications   amLODipine (NORVASC) 5 MG tablet    Sig: Take 1 tablet (5 mg total) by mouth every morning.    Dispense:  30 tablet    Refill:  2

## 2022-04-06 NOTE — Telephone Encounter (Signed)
I added another medication to be taken in the morning and losartan that I sent before in evening.  To avoid excess salty food, excessive wine. Exercise daily

## 2022-04-06 NOTE — Telephone Encounter (Signed)
Patient called and states that her BP is continuing to rise since starting Losartan '50mg'$ , on 04/03/2022. Patient reports her BP being 205/75 today and readings over the weekend of 170-200 over 95. She wants to know what should she do to bring the BP down.   Please Advise

## 2022-04-07 ENCOUNTER — Telehealth: Payer: Self-pay

## 2022-04-07 NOTE — Telephone Encounter (Signed)
Patient called and lvm wanting to know which medication was added on, and I explained to her that it  was  Amlodipine. Patient states that her pharmacy had already reached out to her to let her know that it was ready

## 2022-04-10 ENCOUNTER — Telehealth: Payer: Self-pay | Admitting: Cardiology

## 2022-04-10 NOTE — Telephone Encounter (Signed)
LVM with patient to schedule appointment with [place] for Monday 04/13/22. F/u for high bp and lightheadedness.

## 2022-04-10 NOTE — Telephone Encounter (Signed)
Tried calling patient, still no answer. LMAM

## 2022-04-13 ENCOUNTER — Telehealth: Payer: Self-pay | Admitting: Cardiology

## 2022-04-13 NOTE — Telephone Encounter (Signed)
I will call her and let you know if she still needs appointment.

## 2022-04-13 NOTE — Telephone Encounter (Signed)
Please advise 

## 2022-04-13 NOTE — Telephone Encounter (Signed)
Patient returning our call to schedule appt w/Brittany Nancie Neas. She says she was reading her bp wrong the end of last week, and it's 150/75. Patient does not feel she needs this appointment as of right now, though she confirms she still is lightheaded when she exercises. Would you still like for patient to come in to be seen for her symptoms?

## 2022-04-13 NOTE — Telephone Encounter (Signed)
Done

## 2022-04-15 ENCOUNTER — Ambulatory Visit: Payer: Medicare Other

## 2022-04-15 VITALS — BP 117/66 | HR 84 | Temp 98.0°F | Resp 15 | Ht 66.0 in | Wt 173.0 lb

## 2022-04-15 DIAGNOSIS — I10 Essential (primary) hypertension: Secondary | ICD-10-CM

## 2022-04-15 DIAGNOSIS — R42 Dizziness and giddiness: Secondary | ICD-10-CM

## 2022-04-15 DIAGNOSIS — E7800 Pure hypercholesterolemia, unspecified: Secondary | ICD-10-CM

## 2022-04-15 DIAGNOSIS — I25118 Atherosclerotic heart disease of native coronary artery with other forms of angina pectoris: Secondary | ICD-10-CM

## 2022-04-15 NOTE — Progress Notes (Signed)
Primary Physician/Referring:  Donnajean Lopes, MD  Patient ID: Cynthia Poole, female    DOB: [birth year], 66 y.o.   MRN: [id number]  Chief Complaint  Patient presents with   Hypertension   Dizziness   HPI:    Cynthia Poole  is a 66 y.o. Caucasian female with hyperlipidemia, coronary artery disease status post PCI to the dominant circumflex in 2018, LBBB, history of Hodgkin's lymphoma 1997 status post chemotherapy and radiation therapy to chest presents for 43-monthf/u.  She presents today for follow-up regarding elevated home blood pressure readings and ongoing dizziness. She continues to have  frequent dizziness when she walks up a flight of stairs but continues to exercise with no symptoms.  She does report that her home blood pressure were [id number] but she is unsure if this was correct since she was using the blood pressure monitor incorrectly. The dizziness that she is experiencing and fatigue has progressively worsened. She states she feels worse than she did prior to having stent placed. She denies chest pain, palpitations, leg edema, orthopnea, PND, TIA/syncope.  Past Medical History:  Diagnosis Date   Allergy    Anemia    CAD in native artery 09/29/2016   Coronary angiogram 09/29/16: Dynamic LV systolic function, EF 619% Left dominant circulation. Mild disease in the ramus and LAD. Circumflex midsegment  90% by IVUS S/P stenting 4.0 x 18 mm Onyx DES.   DDD (degenerative disc disease), cervical    DDD (degenerative disc disease), lumbar    Depression    Heart murmur    History of pleurisy    HLD (hyperlipidemia)    Hodgkin's disease 1997   -no chemo per pt ==  Chest RT and splenectomy   Hypertension    pt denies- when walks BP elevates - no dx of HTN per pt   Hypothyroidism    no longer per pt    Insomnia    Internal and external bleeding hemorrhoids    Migraines    MVP (mitral valve prolapse)    Osteopenia    Personal history of colonic adenoma 06/29/2011    06/29/2011 - diminutive adenoma   Seasonal allergies    Shingles 2018   Vitamin D deficiency    Social History   Tobacco Use   Smoking status: Never   Smokeless tobacco: Never  Substance Use Topics   Alcohol use: Yes    Alcohol/week: 7.0 standard drinks of alcohol    Types: 7 Glasses of wine per week    Comment: occasionally  Marital Status: Divorced   ROS  Review of Systems  Constitutional: Positive for malaise/fatigue.  Cardiovascular:  Negative for chest pain, dyspnea on exertion and leg swelling.  Respiratory:  Positive for shortness of breath.   Neurological:  Positive for dizziness and light-headedness.   Objective  Blood pressure 117/66, pulse 84, temperature 98 F (36.7 C), temperature source Temporal, resp. rate 15, height '5\' 6"'  (1.676 m), weight 173 lb (78.5 kg), SpO2 99 %.     04/15/2022    1:33 PM 01/21/2022    9:38 AM 09/17/2021   12:07 PM  Vitals with BMI  Height '5\' 6"'  '5\' 6"'  '5\' 6"'   Weight 173 lbs 171 lbs 171 lbs  BMI 27.94 [id number] Systolic [id number] Diastolic 66 60 73  Pulse 84 81 89    No data found.   Physical Exam Neck:     Vascular: No carotid bruit.  Cardiovascular:  Rate and Rhythm: Normal rate and regular rhythm.     Pulses: Normal pulses and intact distal pulses.          Carotid pulses are  on the right side with bruit and  on the left side with bruit.    Heart sounds: A midsystolic click. Murmur heard.     Mid to late systolic murmur is present with a grade of 2/6 at the apex.     No gallop.  Pulmonary:     Effort: Pulmonary effort is normal.     Breath sounds: Normal breath sounds.  Abdominal:     General: Bowel sounds are normal.     Palpations: Abdomen is soft.  Musculoskeletal:     Right lower leg: No edema.     Left lower leg: No edema.  Skin:    General: Skin is warm.    Laboratory examination:    CrCl cannot be calculated (Patient's most recent lab result is older than the maximum 21 days allowed.).     Latest  Ref Rng & Units 01/14/2021    9:46 AM 09/30/2016    2:47 AM 09/29/2016   10:44 AM  CMP  Glucose 65 - 99 mg/dL 92  109  168   BUN 8 - 27 mg/dL '12  11  9   ' Creatinine 0.57 - 1.00 mg/dL 0.97  0.84  0.85   Sodium 134 - 144 mmol/L 134  136  135   Potassium 3.5 - 5.2 mmol/L 4.7  5.4  3.9   Chloride 96 - 106 mmol/L 98  103  101   CO2 20 - 29 mmol/L '25  26  24   ' Calcium 8.7 - 10.3 mg/dL 9.2  9.5  9.2   Total Protein 6.0 - 8.5 g/dL 6.8     Total Bilirubin 0.0 - 1.2 mg/dL 0.3     Alkaline Phos 44 - 121 IU/L 81     AST 0 - 40 IU/L 22     ALT 0 - 32 IU/L 14         Latest Ref Rng & Units 09/30/2016    2:47 AM 09/29/2016   10:44 AM 06/15/2011    4:48 AM  CBC  WBC 4.0 - 10.5 K/uL 8.2  6.1  5.4   Hemoglobin 12.0 - 15.0 g/dL 11.4  12.3  10.7   Hematocrit 36.0 - 46.0 % 33.8  36.7  31.8   Platelets 150 - 400 K/uL 284  287  266    Lipid Panel Recent Labs    04/17/21 0923 01/20/22 0831  CHOL 206* 193  TRIG 91 117  LDLCALC 129* 103*  HDL 61 70   HEMOGLOBIN A1C Lab Results  Component Value Date   HGBA1C 5.5 09/30/2016   MPG 111 09/30/2016   TSH No results for input(s): "TSH" in the last 8760 hours.  Labs 03/24/2019:   02/19/2021:  Vitamin D 28.7.  Free T4 normal.  TSH normal.  Total cholesterol 173, triglycerides 76, HDL 61, LDL 97.  Non-HDL cholesterol 112.  Serum glucose '1 1 2 ' mg, BUN 12, creatinine 0.9, EGFR 62 mL, potassium 4.4, sodium 133, CMP otherwise normal.  Hb 12.4/HCT 35.8, platelets 271.   Medications and allergies   Allergies  Allergen Reactions   Zofran Other (See Comments)    zofran causes drop in blood pressure   Citalopram Other (See Comments)   Doxycycline Other (See Comments)   Amoxicillin Rash   Codeine Nausea Only   Other Other (  See Comments)    OPIATES & CODEINE CAUSE GI UPSET    Current Outpatient Medications:    ALPRAZolam (XANAX) 1 MG tablet, Take 1 tablet by mouth at bedtime. , Disp: , Rfl:    amitriptyline (ELAVIL) 25 MG tablet, Take 1 tablet  by mouth daily., Disp: , Rfl:    aspirin EC 81 MG EC tablet, Take 1 tablet (81 mg total) by mouth daily., Disp: , Rfl:    atorvastatin (LIPITOR) 20 MG tablet, TAKE 1 TABLET(20 MG) BY MOUTH DAILY, Disp: 90 tablet, Rfl: 0   calcium carbonate (OS-CAL) 600 MG TABS tablet, Take 600 mg by mouth daily., Disp: , Rfl:    Cholecalciferol (VITAMIN D) 2000 UNITS CAPS, Take 2,500 Units by mouth daily. , Disp: , Rfl:    FLUoxetine (PROZAC) 20 MG capsule, Take 60 mg by mouth every morning., Disp: , Rfl:    losartan (COZAAR) 50 MG tablet, Take 1 tablet (50 mg total) by mouth daily., Disp: 30 tablet, Rfl: 2   MAGNESIUM CITRATE PO, Take 300 mg by mouth daily., Disp: , Rfl:    Omega-3 1400 MG CAPS, Take by mouth daily., Disp: , Rfl:    TURMERIC PO, Take 80 mg by mouth daily., Disp: , Rfl:    amLODipine (NORVASC) 5 MG tablet, Take 1 tablet (5 mg total) by mouth every morning. (Patient not taking: Reported on 04/15/2022), Disp: 30 tablet, Rfl: 2   Radiology:   Aortic atherosclerosis (I70.0)   CT of the chest without contrast 11/10/2016 [facility]):  Mild to moderate coronary artery calcification, normal thoracic aorta, mild calcification of the aortic valve and aortic root. Fibrotic changes in both upper lobes likely related to prior radiation therapy.   VQ Scan at [facility] 11/10/2016: Negative for PE.  Cardiac Studies:   Coronary angiogram 09/29/16: Dynamic LV systolic function, EF 34%. Left dominant circulation. Mild disease in the ramus and LAD. Circumflex midsegment 90% stenosis by intravascular ultrasound. Successful stenting with 4.0 x 18 mm Onyx DES.   PCV ECHOCARDIOGRAM COMPLETE 02/11/2021 Left ventricle cavity is normal in size. Mild concentric hypertrophy of the left ventricle. Normal global wall motion. Normal LV systolic function with EF 55%. Doppler evidence of grade I (impaired) diastolic dysfunction, normal LAP. Aneurysmal interatrial septum without 2D or color Doppler evidence of  shunting. Trileaflet aortic valve.  Moderate (Grade II) aortic regurgitation. Moderate (Grade II) mitral regurgitation. Mild to moderate tricuspid regurgitation. Mild pulmonic regurgitation. No evidence of pulmonary hypertension. Study on 07/21/2019 showed mild AI. mild TR.  Carotid artery duplex 09/02/2021: No evidence of significant stenosis in the right carotid vessels. Duplex suggests stenosis in the left internal carotid artery (16-49%). Antegrade right vertebral artery flow. Antegrade left vertebral artery flow. Mild regression of disease compared to prior study 02/11/2021. Follow up in one year is appropriate if clinically indicated.  Exercise treadmill stress test 04/03/2022: Exercise treadmill stress test performed using Bruce protocol.  Patient reached 7 METS, and 88% of age predicted maximum heart rate.  Exercise capacity was low normal.  No chest pain reported.  Normal heart rate response. Resting hypertension 150/80 mmHg, with hypertensive exercise response-peak BP 240/90 mmHg. Stress EKG uninterpretable for ischemia due to underlying LBBB. Consider alternate testing if ischemia evaluation indicated.   EKG:    EKG 09/17/2021: Normal sinus rhythm at rate of 81 bpm, left bundle branch block.  No further analysis. No significant change from EKG 04/11/2020   Assessment     ICD-10-CM   1. Primary hypertension  I10     2. Dizziness and giddiness  R42     3. Coronary artery disease of native artery of native heart with stable angina pectoris ([place])  I25.118 PCV MYOCARDIAL PERFUSION WITH LEXISCAN    4. Pure hypercholesterolemia  E78.00      No orders of the defined types were placed in this encounter.   There are no discontinued medications.    Recommendations:   CEARA WRIGHTSON  is a 66 y.o. Caucasian female with hyperlipidemia, coronary artery disease status post PCI to the dominant circumflex in 2018, LBBB, history of Hodgkin's lymphoma 1997 status post chemotherapy and  radiation therapy to chest presents for 81-monthf/u.  Primary hypertension Dizziness and giddiness Blood pressure is well controlled. She was instructed on how to use home blood pressure monitor and advised not to check home blood pressure multiple times per day. Will continue Losartan 521mdaily.  She has stopped taking Amlodipine and I am ok with stopping this.  Coronary artery disease of native artery of native heart with stable angina pectoris [place] that presenting symptoms are related to overall deconditioning however, cannot completely exclude cardiac etiology given history therefore, will schedule for nuclear stress test to rule out other causes of symptoms.   Pure hypercholesterolemia Reviewed labs, LDL not at goal. She does report compliance with Atorvastatin. At this time will not adjust medication or dosage and will recheck lipid profile panel at next visit.  No change in mitral regurgitation murmur.  Follow-up in 3 months of sooner if needed.   BrErnst SpellAGNP-C 04/15/2022, 3:24 PM Office: [phone]: [phone]

## 2022-04-27 ENCOUNTER — Other Ambulatory Visit: Payer: Self-pay | Admitting: Cardiology

## 2022-04-27 DIAGNOSIS — E7800 Pure hypercholesterolemia, unspecified: Secondary | ICD-10-CM

## 2022-05-07 ENCOUNTER — Other Ambulatory Visit: Payer: Self-pay | Admitting: Otolaryngology

## 2022-05-07 DIAGNOSIS — H918X3 Other specified hearing loss, bilateral: Secondary | ICD-10-CM

## 2022-05-11 ENCOUNTER — Ambulatory Visit: Payer: Medicare Other

## 2022-05-11 DIAGNOSIS — I25118 Atherosclerotic heart disease of native coronary artery with other forms of angina pectoris: Secondary | ICD-10-CM

## 2022-05-20 ENCOUNTER — Telehealth: Payer: Self-pay | Admitting: Cardiology

## 2022-05-20 DIAGNOSIS — R9439 Abnormal result of other cardiovascular function study: Secondary | ICD-10-CM

## 2022-05-20 DIAGNOSIS — I25118 Atherosclerotic heart disease of native coronary artery with other forms of angina pectoris: Secondary | ICD-10-CM

## 2022-05-20 DIAGNOSIS — R0609 Other forms of dyspnea: Secondary | ICD-10-CM

## 2022-05-20 NOTE — Telephone Encounter (Signed)
ICD-10-CM   1. Coronary artery disease of native artery of native heart with stable angina pectoris ([place])  I25.118     2. Dyspnea on exertion  R06.09     3. Abnormal nuclear stress test  R94.39       Adrian Prows, MD, [facility] 05/20/2022, 9:54 PM Office: [phone] Fax: [phone] Pager: [phone]

## 2022-05-20 NOTE — Telephone Encounter (Signed)
I reviewed the results of the nuclear stress test with the patient.  There is severe reversible ischemia in the mid to distal anterior wall however preserved LVEF suggest this is probably an artifact from LBBB.  Overall intermediate risk test.  Patient states that she is extremely fatigued when doing things and gets markedly short of breath even doing minimal activities.  She is extremely concerned about progression of coronary disease as her risk factors have not not been well-controlled including lipids.  She request that we proceed with cardiac catheterization for definitive diagnosis.  I explained to her that given normal LVEF, we could certainly continue medical therapy only, coronary CTA would not be helpful in view of prior stents and also to decrease number of test, will proceed with cardiac catheterization.  Schedule for cardiac catheterization, and possible angioplasty. We discussed regarding risks, benefits, alternatives to this including stress testing, CTA and continued medical therapy. Patient wants to proceed. Understands <1-2% risk of death, stroke, MI, urgent CABG, bleeding, infection, renal failure but not limited to these.   Labs 04/29/2022:  Serum glucose 9 9 mg, BUN 19, creatinine 1.0, EGFR 55 mL, potassium 4.5, LFTs normal.  Hb 12.6/HCT 34.0, platelets 391, normal indicis.  TSH normal at 3.75.  Free T4 normal at 0.8.  Cynthia Poole - 66 y.o. Female; born [birth year]Patient Health Record, generated on Nov. 29, 2023November 29, 2023  ALLERGIES Reconcile with Patient's ChartALLERGIES Allergen (clinical drug ingredient) Drug/Non Drug Allergy documented on EMR Reaction Allergy Type Onset Date Status  amoxicillin Amoxicillin Rash Drug Allergy 09/13/2015 Active  citalopram CeleXA Headaches, migraines Drug Allergy 11/25/2010 Active  doxycycline Doxycycline GI upset Drug Allergy 10/23/2009 Active  codeine Codeine nausea Drug Allergy 10/23/2009 Active    RESULTS  RESULTS Component Value Reference Range Notes  COMPLETE METABOLIC Reviewed [id number] 11:47:20 AM Interpretation: Performing Lab: Notes/Report:  GLUCOSE 99 60 - 110 mg/dl    BUN 19 5 - 23 mg/dl    CREATININE 1.0 0.3 - 1.5 mg/dl    eGFR Non-African American 55.5 <    eGFR African American 67.1 <    SODIUM 135 135 - 148 mEq/L    POTASSIUM 4.5 3.5 - 5.3 mEq/L    CHLORIDE 103 80 - 111 mEq/L    CO2 24 15 - 35 mEq/L    CALCIUM 9.1 7.0 - 10.5 mg/dL    TOTAL PROTEIN 6.9 6.0 - 8.5 g/dL    ALBUMIN 3.9 2.0 - 5.5 g/dL    AST 28 7 - 45 IU/L    ALT 24 5 - 40 IU/L    ALK PHOS 89 37 - 137 IU/L    TOTAL BILIRUBIN 0.4 0.0 - 1.5 mg/dl    CBC Reviewed date:04/21/2022 09:24:18 PM Interpretation: Performing Lab: Notes/Report:  WBC 6.19 4.10 - 10.90 K/uL    LYM 2.0 0.6 - 4.1 K/uL    BASO% 1.6 0.0 - 2.0    EOS 0.1 0.0 - 0.4    BASO 0.1 0.0 - 0.2    EOS% 1.8 0.0 - 7.8    LYM% 32.2 10.0 - 58.5 %    RBC 3.9 4.2 - 6.3 M/uL    HGB 12.6 12.0 - 18.0 g/dL    HCT 34.0 37.0 - 51.0 %    MCV 88.0 80.0 - 97.0 fL    MCH 32.7 26.0 - 32.0 pg    MCHC 35.7 31.0 - 36.0 g/dL    RDW 11.8 12.3 - 15.4    PLT 301  140 - 440 K/uL    MPV 7.5 6.9 - 10.6    NEU 3.4 1.4 - 7.0    MONO% 9.1 4.6 - 12.4    MONO 0.6 0.1 - 0.9    NEU% 55.4 43.3 - 71.9     Adrian Prows, MD, [facility] 05/20/2022, 9:51 PM Office: [phone] Fax: [phone] Pager: [phone]

## 2022-05-29 ENCOUNTER — Ambulatory Visit (HOSPITAL_COMMUNITY): Admission: RE | Admit: 2022-05-29 | Payer: Medicare Other | Source: Home / Self Care | Admitting: Cardiology

## 2022-05-29 ENCOUNTER — Encounter (HOSPITAL_COMMUNITY): Admission: RE | Payer: Self-pay | Source: Home / Self Care

## 2022-05-29 SURGERY — LEFT HEART CATH AND CORONARY ANGIOGRAPHY
Anesthesia: LOCAL

## 2022-06-04 ENCOUNTER — Ambulatory Visit: Payer: Medicare Other | Admitting: Internal Medicine

## 2022-06-04 ENCOUNTER — Ambulatory Visit
Admission: RE | Admit: 2022-06-04 | Discharge: 2022-06-04 | Disposition: A | Discharge status: Home / Self Care | Payer: Medicare Other | Source: Ambulatory Visit | Attending: Otolaryngology | Admitting: Otolaryngology

## 2022-06-04 DIAGNOSIS — H918X3 Other specified hearing loss, bilateral: Secondary | ICD-10-CM

## 2022-06-04 MED ORDER — GADOPICLENOL 0.5 MMOL/ML IV SOLN
7.0000 mL | Freq: Once | INTRAVENOUS | Status: AC | PRN
  Administered 2022-06-04: 7 mL via INTRAVENOUS

## 2022-06-26 ENCOUNTER — Ambulatory Visit: Payer: Medicare Other | Admitting: Physical Therapy

## 2022-07-08 ENCOUNTER — Other Ambulatory Visit: Payer: Self-pay | Admitting: Cardiology

## 2022-07-08 ENCOUNTER — Ambulatory Visit: Payer: Medicare Other | Admitting: Physical Therapy

## 2022-07-08 DIAGNOSIS — I10 Essential (primary) hypertension: Secondary | ICD-10-CM

## 2022-07-08 DIAGNOSIS — I25118 Atherosclerotic heart disease of native coronary artery with other forms of angina pectoris: Secondary | ICD-10-CM

## 2022-07-12 ENCOUNTER — Other Ambulatory Visit: Payer: Self-pay | Admitting: Cardiology

## 2022-07-12 DIAGNOSIS — I10 Essential (primary) hypertension: Secondary | ICD-10-CM

## 2022-07-16 ENCOUNTER — Ambulatory Visit: Payer: BLUE CROSS/BLUE SHIELD | Admitting: Cardiology

## 2022-07-21 NOTE — Therapy (Signed)
OUTPATIENT PHYSICAL THERAPY VESTIBULAR EVALUATION     Patient Name: Cynthia Poole MRN: [id number] DOB:12-Jan-1956, 67 y.o., female Today's Date: 07/22/2022  END OF SESSION:  PT End of Session - 07/22/22 1153     Visit Number 1    Number of Visits 9    Date for PT Re-Evaluation 08/21/22    Authorization Type Medicare    PT Start Time 1105    PT Stop Time 1145    PT Time Calculation (min) 40 min    Activity Tolerance Patient tolerated treatment well   low BP   Behavior During Therapy WFL for tasks assessed/performed             Past Medical History:  Diagnosis Date   Allergy    Anemia    CAD in native artery 09/29/2016   Coronary angiogram 09/29/16: Dynamic LV systolic function, EF 54%. Left dominant circulation. Mild disease in the ramus and LAD. Circumflex midsegment  90% by IVUS S/P stenting 4.0 x 18 mm Onyx DES.   DDD (degenerative disc disease), cervical    DDD (degenerative disc disease), lumbar    Depression    Heart murmur    History of pleurisy    HLD (hyperlipidemia)    Hodgkin's disease 1997   -no chemo per pt ==  Chest RT and splenectomy   Hypertension    pt denies- when walks BP elevates - no dx of HTN per pt   Hypothyroidism    no longer per pt    Insomnia    Internal and external bleeding hemorrhoids    Migraines    MVP (mitral valve prolapse)    Osteopenia    Personal history of colonic adenoma 06/29/2011   06/29/2011 - diminutive adenoma   Seasonal allergies    Shingles 2018   Vitamin D deficiency    Past Surgical History:  Procedure Laterality Date   BUNIONECTOMY     CARDIAC CATHETERIZATION  09/29/2016   COLONOSCOPY  09/02/2007   redundant colon, external hemorrhoids   COLONOSCOPY  06/29/2011   diminutive polyp, diverticulosis, external hemorrhoids   CORONARY STENT INTERVENTION N/A 09/29/2016   Procedure: Coronary Stent Intervention;  Surgeon: Adrian Prows, MD;  Location: [facility];  Service: Cardiovascular;  Laterality: N/A;   DEEP  NECK LYMPH NODE BIOPSY / EXCISION  1996   right LN    ESOPHAGOGASTRODUODENOSCOPY  06/29/2011   notrmal, 54 Fr dilation (dysphagia)   INTRAVASCULAR ULTRASOUND/IVUS N/A 09/29/2016   Procedure: Intravascular Ultrasound/IVUS;  Surgeon: Adrian Prows, MD;  Location: [facility];  Service: Cardiovascular;  Laterality: N/A;   LEFT HEART CATH AND CORONARY ANGIOGRAPHY N/A 09/29/2016   Procedure: Left Heart Cath and Coronary Angiography;  Surgeon: Adrian Prows, MD;  Location: [facility];  Service: Cardiovascular;  Laterality: N/A;   POLYPECTOMY     SPLENECTOMY, TOTAL  1996   STRABISMUS SURGERY  02/2009   left   TONSILLECTOMY     age [age]   Patient Active Problem List   Diagnosis Date Noted   Asymptomatic stenosis of left carotid artery 02/27/2021   Anxiety and depression 02/14/2019   Lumbosacral radiculopathy at S1 01/12/2018   Coronary artery disease of native artery of native heart with stable angina pectoris ([place]) 11/02/2016   Hyperlipidemia 11/02/2016   Unstable angina pectoris ([place]) 09/29/2016   Esophageal dysmotility 07/27/2014   Personal history of colonic adenoma 06/29/2011   Lymphoma in remission ([place]) 06/14/2011    Class: Chronic   S/P splenectomy  06/14/2011    Class: Chronic   Mitral valve prolapse 06/14/2011    Class: Chronic   Low back pain 06/14/2011    Class: Chronic   Fibromyalgia 06/14/2011    Class: Chronic   IBS (irritable bowel syndrome) 06/10/2011    PCP: Donnajean Lopes, MD  REFERRING PROVIDER: Raylene Miyamoto, MD   REFERRING DIAG: R42 (ICD-10-CM) - Dizziness   THERAPY DIAG:  Unsteadiness on feet  Dizziness and giddiness  ONSET DATE: 06/09/2022 (date of referral)  Rationale for Evaluation and Treatment: Rehabilitation  SUBJECTIVE:   SUBJECTIVE STATEMENT: Reports BP will be low at times, more so in the morning. Will make her feel lightheaded. Has a cardiac hx, and follows up with cardiologist. Has some dizziness with exercise. Did a  cardiac stress test recently and reports her systolic went up to 546.  Had some BP meds changed recently, and has been feeling a little more lightheaded in the mornings - causing her to grab onto the walls.  Now BP is running lower. Notices with pilates when she nods her head up and down it will make her dizzy, will feel like a lightheaded sensation.  Having more trouble with her balance. [place] for the first time this morning. Normally has to get up and hold onto objects. When going into the bathroom, let go of the wall and bonked her head into the wall. Did not bump it very hard. Standing up quickly or exercising will make her feel off balance. Used to like playing tennis, but had to stop due to that she wasn't able to see the ball. Noticed initially when trying to walk and turn her head, would feel off balanced.   Pt accompanied by: self  PERTINENT HISTORY: PMH: hyperlipidemia, DDD, depression, HTN, osteopenia, migraines, coronary artery disease status post PCI to the dominant circumflex in 2018, LBBB, history of Hodgkin's lymphoma 1997 status post chemotherapy and radiation therapy to chest   Per Dr. Benjamine Mola ENT note from 06/09/22: Pt noted to have asymmetric L ear sensorineural hearing loss, central vestibular dysfunction    PAIN:  Are you having pain? No  PRECAUTIONS: Fall and Other: monitor BP  WEIGHT BEARING RESTRICTIONS: No  FALLS: Has patient fallen in last 6 months? No. 1 almost fall this morning into the wall.   LIVING ENVIRONMENT: Lives with: lives alone Lives in: House/apartment Stairs: Yes: Internal: 12 steps; on right going up, has to sit down to rest after doing steps.  Has following equipment at home: None  PLOF: Independent  PATIENT GOALS: To get some exercises to practice at home.   OBJECTIVE:   DIAGNOSTIC FINDINGS:   MRI brain 06/04/22:  IMPRESSION: Unremarkable MRI appearance of the brain for age. No evidence of acute intracranial abnormality.   No  cerebellopontine angle or internal auditory canal mass.   No specific cause of hearing loss is identified.    COGNITION: Overall cognitive status: Within functional limits for tasks assessed   GAIT: Gait pattern: WFL Distance walked: Clinic distances Assistive device utilized: None Level of assistance: Complete Independence Comments: Pt reporting at home, having to hold onto the wall for balance, esp in the mornings.    PATIENT SURVEYS:  FOTO DFS: 50, DPS: 58  VESTIBULAR ASSESSMENT:  GENERAL OBSERVATION: Ambulates into session with no AD.    SYMPTOM BEHAVIOR:  Subjective history: See above. Reports gradual imbalance starting about 10 years ago.   Non-Vestibular symptoms: tinnitus  Type of dizziness: Imbalance (Disequilibrium), Spinning/Vertigo, Unsteady with head/body turns, and Lightheadedness/Faint  Frequency: Daily, mainly in mornings   Duration: Hours  Aggravating factors: Induced by position change: sit to stand and Induced by motion: bending down to the ground, turning body quickly, and turning head quickly  Relieving factors:  Sitting still.   Progression of symptoms: worse, reports progressively worsening for 10 years.   OCULOMOTOR EXAM:  Ocular Alignment: normal  Ocular ROM: No Limitations  Spontaneous Nystagmus: absent  Gaze-Induced Nystagmus: absent  Smooth Pursuits: intact  Saccades: intact    VESTIBULAR - OCULAR REFLEX:   Slow VOR: Normal, Mild dizziness   VOR Cancellation: Normal, Mild dizziness   Head-Impulse Test: HIT Right: negative HIT Left: positive, very slight   Dynamic Visual Acuity: Static: Line 7 Dynamic: Line 6 Pt reporting no symptoms with this.    OTHOSTATICS:  Sitting: 109/49 Standing: 94/60    M-CTSIB  Condition 1: Firm Surface, EO 30 Sec, Normal Sway  Condition 2: Firm Surface, EC 30 Sec, Mild Sway  Condition 3: Foam Surface, EO 30 Sec, Normal Sway  Condition 4: Foam Surface, EC 30 Sec, Mild Sway    Pt reporting feeling more  off balanced with EC.     VESTIBULAR TREATMENT:                                                                                                     N/A during eval.   PATIENT EDUCATION: Education details: Clinical findings, POC, following up with cardiologist due to low BP that can be contributing to pt's dizziness/off balance.  Person educated: Patient Education method: Explanation Education comprehension: verbalized understanding  HOME EXERCISE PROGRAM: Will provide at next session.   GOALS: Goals reviewed with patient? Yes  SHORT TERM GOALS: ALL STGS = LTGS  LONG TERM GOALS: Target date: 08/19/2022  Pt will be independent with final HEP for balance in order to build upon functional gains made in therapy. Baseline:  Goal status: INITIAL  2.  FGA to be assessed with LTG written. Baseline:  Goal status: INITIAL  3.  SOT to be assessed with LTG written. Baseline:  Goal status: INITIAL  4.  Pt will improve DFS to 56 in order to demo improved functional outcomes.  Baseline: 50 Goal status: INITIAL  ASSESSMENT:  CLINICAL IMPRESSION: Patient is a 67 year old female referred to Neuro OPPT for Dizziness.  Pt saw Dr. Benjamine Mola who reports central vestibular dysfunction.  Pt had MRI of brain in December of 2023; Unremarkable MRI appearance of the brain for age. No evidence of acute intracranial abnormality. Pt has been having lower BP, esp in the morning and pt having to hold onto walls for balance. Pt to follow up with her cardiologist regarding this. Pt's PMH is significant for: hyperlipidemia, DDD, depression, HTN, osteopenia, migraines, coronary artery disease status post PCI to the dominant circumflex in 2018, LBBB, history of Hodgkin's lymphoma 1997 status post chemotherapy and radiation therapy to chest, asymmetric L ear sensorineural hearing loss. The following deficits were present during the exam: impaired balance, positive HIT to the L, indicating impaired VOR. Pt did only  have a 1 line difference  on the DVA. Pt able to hold all 4 conditions of mCTSIB for 30 seconds, but pt more off balance with EC. Will perform further balance testing at next session. Pt would benefit from skilled PT to address these impairments and functional limitations to maximize functional mobility independence and improve balance.    OBJECTIVE IMPAIRMENTS: cardiopulmonary status limiting activity, decreased activity tolerance, decreased balance, and dizziness.   ACTIVITY LIMITATIONS: bending, stairs, transfers, and locomotion level  PARTICIPATION LIMITATIONS: community activity and exercise  PERSONAL FACTORS: Past/current experiences, Time since onset of injury/illness/exacerbation, and 3+ comorbidities: hyperlipidemia, DDD, depression, HTN, osteopenia, migraines, coronary artery disease status post PCI to the dominant circumflex in 2018, LBBB, history of Hodgkin's lymphoma 1997 status post chemotherapy and radiation therapy to chest, asymmetric L ear sensorineural hearing loss  are also affecting patient's functional outcome.   REHAB POTENTIAL: Good  CLINICAL DECISION MAKING: Stable/uncomplicated  EVALUATION COMPLEXITY: Low   PLAN:  PT FREQUENCY: 2x/week  PT DURATION: 4 weeks  PLANNED INTERVENTIONS: Therapeutic exercises, Therapeutic activity, Neuromuscular re-education, Balance training, Gait training, Patient/Family education, Self Care, Vestibular training, Canalith repositioning, and Re-evaluation  PLAN FOR NEXT SESSION: Perform FGA and SOT and write goal. Initial HEP for balance with EC, VOR. Monitor BP.    Arliss Journey, PT, DPT  07/22/2022, 11:54 AM

## 2022-07-22 ENCOUNTER — Telehealth: Payer: Self-pay

## 2022-07-22 ENCOUNTER — Encounter: Payer: Self-pay | Admitting: Physical Therapy

## 2022-07-22 ENCOUNTER — Encounter: Payer: Self-pay | Admitting: Cardiology

## 2022-07-22 ENCOUNTER — Ambulatory Visit: Payer: Medicare Other | Attending: Otolaryngology | Admitting: Physical Therapy

## 2022-07-22 ENCOUNTER — Ambulatory Visit: Payer: Medicare Other | Admitting: Cardiology

## 2022-07-22 VITALS — BP 118/67 | HR 82 | Ht 66.0 in | Wt 173.0 lb

## 2022-07-22 VITALS — BP 94/60 | HR 82

## 2022-07-22 DIAGNOSIS — I25118 Atherosclerotic heart disease of native coronary artery with other forms of angina pectoris: Secondary | ICD-10-CM

## 2022-07-22 DIAGNOSIS — R2681 Unsteadiness on feet: Secondary | ICD-10-CM | POA: Diagnosis not present

## 2022-07-22 DIAGNOSIS — R42 Dizziness and giddiness: Secondary | ICD-10-CM | POA: Diagnosis present

## 2022-07-22 DIAGNOSIS — R55 Syncope and collapse: Secondary | ICD-10-CM

## 2022-07-22 DIAGNOSIS — I340 Nonrheumatic mitral (valve) insufficiency: Secondary | ICD-10-CM

## 2022-07-22 NOTE — Progress Notes (Signed)
Primary Physician/Referring:  Donnajean Lopes, MD  Patient ID: Cynthia Poole, female    DOB: 10-06-1955, 67 y.o.   MRN: [id number]  Chief Complaint  Patient presents with   Low BP    Dizziness   Coronary Artery Disease   HPI:    Cynthia Poole  is a 67 y.o. Caucasian female with hyperlipidemia, coronary artery disease status post PCI to the dominant circumflex in 2018, LBBB, history of Hodgkin's lymphoma 1997 status post chemotherapy and radiation therapy to chest presents for 43-monthf/u.  She continues to have  frequent dizziness when she walks up a flight of stairs but continues to exercise with no symptoms.  No chest pain, syncope.  Patient states that her activity level has decreased remarkably since her symptoms of gotten worse over the past 6 to 8 months.  She has had mild exercise-induced dizziness 3 years ago and had an event monitor which did not reveal any significant arrhythmias.  In view of her persistent symptoms, [facility], underwent cardiac catheterization which revealed patent stent and she now presents for office visit.  Past Medical History:  Diagnosis Date   Allergy    Anemia    CAD in native artery 09/29/2016   Coronary angiogram 09/29/16: Dynamic LV systolic function, EF 624% Left dominant circulation. Mild disease in the ramus and LAD. Circumflex midsegment  90% by IVUS S/P stenting 4.0 x 18 mm Onyx DES.   DDD (degenerative disc disease), cervical    DDD (degenerative disc disease), lumbar    Depression    Heart murmur    History of pleurisy    HLD (hyperlipidemia)    Hodgkin's disease 1997   -no chemo per pt ==  Chest RT and splenectomy   Hypertension    pt denies- when walks BP elevates - no dx of HTN per pt   Hypothyroidism    no longer per pt    Insomnia    Internal and external bleeding hemorrhoids    Migraines    MVP (mitral valve prolapse)    Osteopenia    Personal history of colonic adenoma  06/29/2011   06/29/2011 - diminutive adenoma   Seasonal allergies    Shingles 2018   Vitamin D deficiency    Social History   Tobacco Use   Smoking status: Never   Smokeless tobacco: Never  Substance Use Topics   Alcohol use: Yes    Alcohol/week: 7.0 standard drinks of alcohol    Types: 7 Glasses of wine per week    Comment: occasionally  Marital Status: Divorced   ROS  Review of Systems  Cardiovascular:  Negative for chest pain, dyspnea on exertion and leg swelling.  Neurological:  Positive for dizziness (with walking the stairs).   Objective  Blood pressure 118/67, pulse 82, height '5\' 6"'$  (1.676 m), weight 173 lb (78.5 kg), SpO2 95 %.     07/22/2022    1:45 PM 07/22/2022   11:26 AM 07/22/2022   11:24 AM  Vitals with BMI  Height '5\' 6"'$     Weight 173 lbs    BMI 226.83   Systolic 141994 1622 Diastolic 67 60 49  Pulse 82 82 76    Orthostatic VS for the past 72 hrs (Last 3 readings):  Orthostatic BP Patient Position BP Location Cuff Size Orthostatic Pulse  07/22/22 1352 116/67 Standing Left Arm Normal 82  07/22/22 1351 126/64 Sitting Left Arm Normal 79  07/22/22 1349 127/59 Supine Left Arm Normal (!) 44    No data found.   Physical Exam Neck:     Vascular: No carotid bruit or JVD.  Cardiovascular:     Rate and Rhythm: Normal rate and regular rhythm.     Pulses: Normal pulses and intact distal pulses.          Carotid pulses are  on the right side with bruit and  on the left side with bruit.    Heart sounds: No midsystolic click. Murmur heard.     Mid to late systolic murmur is present with a grade of 2/6 at the upper right sternal border and apex.     No gallop.  Pulmonary:     Effort: Pulmonary effort is normal.     Breath sounds: Normal breath sounds.  Abdominal:     General: Bowel sounds are normal.     Palpations: Abdomen is soft.  Musculoskeletal:     Right lower leg: No edema.     Left lower leg: No edema.  Skin:    General: Skin is warm.    Laboratory  examination:      Latest Ref Rng & Units 01/14/2021    9:46 AM 09/30/2016    2:47 AM 09/29/2016   10:44 AM  CMP  Glucose 65 - 99 mg/dL 92  109  168   BUN 8 - 27 mg/dL '12  11  9   '$ Creatinine 0.57 - 1.00 mg/dL 0.97  0.84  0.85   Sodium 134 - 144 mmol/L 134  136  135   Potassium 3.5 - 5.2 mmol/L 4.7  5.4  3.9   Chloride 96 - 106 mmol/L 98  103  101   CO2 20 - 29 mmol/L '25  26  24   '$ Calcium 8.7 - 10.3 mg/dL 9.2  9.5  9.2   Total Protein 6.0 - 8.5 g/dL 6.8     Total Bilirubin 0.0 - 1.2 mg/dL 0.3     Alkaline Phos 44 - 121 IU/L 81     AST 0 - 40 IU/L 22     ALT 0 - 32 IU/L 14         Latest Ref Rng & Units 09/30/2016    2:47 AM 09/29/2016   10:44 AM 06/15/2011    4:48 AM  CBC  WBC 4.0 - 10.5 K/uL 8.2  6.1  5.4   Hemoglobin 12.0 - 15.0 g/dL 11.4  12.3  10.7   Hematocrit 36.0 - 46.0 % 33.8  36.7  31.8   Platelets 150 - 400 K/uL 284  287  266    Lipid Panel Recent Labs    01/20/22 0831  CHOL 193  TRIG 117  LDLCALC 103*  HDL 70   HEMOGLOBIN A1C Lab Results  Component Value Date   HGBA1C 5.5 09/30/2016   MPG 111 09/30/2016   Lab Results  Component Value Date   TSH 3.670 01/14/2021    Labs 03/24/2019:   Labs 07/01/2022:  Hb 12.0/HCT 36.5, platelets 320.  Serum bilirubin 0.2, AST mildly elevated at 50 and ALT mildly elevated at 43.  BUN 25, creatinine 1.06, potassium 5.2, EGFR 58 mL.  Total cholesterol 191, triglycerides 102, HDL 61, LDL 110, non-HDL cholesterol 130.  LPA <30.  02/19/2021:  Vitamin D 28.7.  Free T4 normal.  TSH normal.  Medications and allergies   Allergies  Allergen Reactions   Zofran Other (See Comments)    zofran causes drop in blood  pressure   Citalopram Other (See Comments)   Doxycycline Other (See Comments)   Amoxicillin Rash   Codeine Nausea Only   Other Other (See Comments)    OPIATES & CODEINE CAUSE GI UPSET    Current Outpatient Medications:    ALPRAZolam (XANAX) 1 MG tablet, Take 1 tablet by mouth at bedtime. , Disp: , Rfl:     amitriptyline (ELAVIL) 25 MG tablet, Take 1 tablet by mouth daily., Disp: , Rfl:    aspirin EC 81 MG EC tablet, Take 1 tablet (81 mg total) by mouth daily., Disp: , Rfl:    atorvastatin (LIPITOR) 20 MG tablet, TAKE 1 TABLET(20 MG) BY MOUTH DAILY, Disp: 30 tablet, Rfl: 0   calcium carbonate (OS-CAL) 600 MG TABS tablet, Take 600 mg by mouth daily., Disp: , Rfl:    Cholecalciferol (VITAMIN D) 2000 UNITS CAPS, Take 2,500 Units by mouth daily. , Disp: , Rfl:    FLUoxetine (PROZAC) 20 MG capsule, Take 40 mg by mouth every morning., Disp: , Rfl:    losartan (COZAAR) 50 MG tablet, TAKE 1 TABLET(50 MG) BY MOUTH DAILY (Patient taking differently: Take 25 mg by mouth daily.), Disp: 90 tablet, Rfl: 3   MAGNESIUM CITRATE PO, Take 300 mg by mouth daily., Disp: , Rfl:    Omega-3 1400 MG CAPS, Take by mouth daily., Disp: , Rfl:    TURMERIC PO, Take 80 mg by mouth daily., Disp: , Rfl:    amLODipine (NORVASC) 5 MG tablet, TAKE 1 TABLET(5 MG) BY MOUTH EVERY MORNING (Patient not taking: Reported on 07/22/2022), Disp: 30 tablet, Rfl: 2   Radiology:   Aortic atherosclerosis (I70.0)   CT of the chest without contrast 11/10/2016 [facility]):  Mild to moderate coronary artery calcification, normal thoracic aorta, mild calcification of the aortic valve and aortic root. Fibrotic changes in both upper lobes likely related to prior radiation therapy.   VQ Scan at [facility] 11/10/2016: Negative for PE.  Cardiac Studies:   Coronary angiogram 09/29/16: Dynamic LV systolic function, EF 45%. Left dominant circulation. Mild disease in the ramus and LAD. Circumflex midsegment 90% stenosis by intravascular ultrasound. Successful stenting with 4.0 x 18 mm Onyx DES.   PCV ECHOCARDIOGRAM COMPLETE 02/11/2021  Narrative Echocardiogram 02/11/2021: Left ventricle cavity is normal in size. Mild concentric hypertrophy of the left ventricle. Normal global wall motion. Normal LV systolic function with EF 55%. Doppler evidence  of grade I (impaired) diastolic dysfunction, normal LAP. Aneurysmal interatrial septum without 2D or color Doppler evidence of shunting. Trileaflet aortic valve.  Moderate (Grade II) aortic regurgitation. Moderate (Grade II) mitral regurgitation. Mild to moderate tricuspid regurgitation. Mild pulmonic regurgitation. No evidence of pulmonary hypertension. Study on 07/21/2019 showed mild AI. mild TR.  Carotid artery duplex 09/02/2021: No evidence of significant stenosis in the right carotid vessels. Duplex suggests stenosis in the left internal carotid artery (16-49%). Antegrade right vertebral artery flow. Antegrade left vertebral artery flow. Mild regression of disease compared to prior study 02/11/2021. Follow up in one year is appropriate if clinically indicated.  Event Monitor for 30 days Start date 07/21/2019 for dizziness and near syncope: Normal sinus rhythm.  No significant arrhythmias.  No significant change from  06/18/17 (Occasional PVC and PAC.mal sinus rhythm.  No significant arrhythmias.  Preliminary report. No significant change from  06/18/17 (Occasional PVC and PAC.   PCV CARDIAC STRESS TEST 04/03/2022  Narrative Exercise treadmill stress test 04/03/2022: Exercise treadmill stress test performed using Bruce protocol.  Patient reached 7 METS, and  88% of age predicted maximum heart rate.  Exercise capacity was low normal.  No chest pain reported.  Normal heart rate response. Resting hypertension 150/80 mmHg, with hypertensive exercise response-peak BP 240/90 mmHg. Stress EKG uninterpretable for ischemia due to underlying LBBB. Consider alternate testing if ischemia evaluation indicated.  PCV MYOCARDIAL PERFUSION WITH LEXISCAN 05/11/2022  Narrative Lexiscan (with Mod Bruce protocol) Nuclear stress test 05/11/2022: Myocardial perfusion is abnormal. There is a reversible severe defect in the basal to mid anterior and septal regions. Overall LV systolic function is normal without  regional wall motion abnormalities. Stress LV EF: 66%. Nondiagnostic ECG stress. The heart rate response was consistent with Regadenoson. In the absence of wall motion abnormality and preserved LVEF, the defect may be due to LBBB. Compared to 07/17/2019, the defect severity appears to be much more prominent in the present study. Intermediate risk study. Clinical correlation recommended.  Coronary angiogram on 07/02/2022:  Left main trunk normal. LAD, mild diffuse disease. Mild segment of myocardial bridging in the mid third. Circumflex: Dominant distribution. Mild disease in the proximal third. Coronary ectasia in the mid segment. Patent stent in the mid to distal third. RCA: Nondominant, mild diffuse disease. Angiographic findings show no significant progression of disease compared to 2018.   EKG:    That that EKG 07/22/2022: Normal sinus rhythm at rate of 72 bpm, left atrial enlargement, left bundle branch block.  No further analysis.  Compared to 09/17/2021, no change.  Assessment     ICD-10-CM   1. Coronary artery disease of native artery of native heart with stable angina pectoris ([place])  I25.118 EKG 12-Lead    PCV ECHOCARDIOGRAM COMPLETE    2. Near syncope  R55 Ambulatory referral to Cardiac Electrophysiology    PCV ECHOCARDIOGRAM COMPLETE    3. Moderate mitral regurgitation  I34.0 PCV ECHOCARDIOGRAM COMPLETE     No orders of the defined types were placed in this encounter.   There are no discontinued medications.    Recommendations:   Cynthia Poole  is a 67 y.o. Caucasian female with hyperlipidemia, coronary artery disease status post PCI to the dominant circumflex in 2018, LBBB, history of Hodgkin's lymphoma 1997 status post chemotherapy and radiation therapy to chest presents for 33-monthf/u.  1. Coronary artery disease of native artery of native heart with stable angina pectoris ([facility] Patient with coronary disease, has not had any recurrence of angina pectoris, she had had a  nuclear stress test on 05/11/2022 which had revealed reversible defect in the basal to mid anterior and septal regions however her LVEF was normal and hence felt to be due to underlying LBBB.  [facility] and had cardiac catheterization performed on 07/02/2022 revealing widely patent stent and mild disease in the coronaries.  From cardiac standpoint she remains stable without recurrence of angina pectoris.  She has been reluctant to make any changes to her lipid therapy, she is presently on atorvastatin 20 mg daily and does not want to go or increase the dose.  2. Near syncope Her main concern today continues to be dizziness and marked decrease in exercise tolerance due to marked onset of dizziness while during physical activity, she has had near syncopal spells and states that she also fell last week but did not hurt herself or had no frank syncope.  Her symptoms have been ongoing for the past 3 years but over the last 6 to 8 months her symptoms of dizziness have gotten worse.  She is not orthostatic.  I would like to refer her for EP evaluation for evaluation of the same symptoms and consider a tilt table test.    3. Moderate mitral regurgitation She had moderate mitral regurgitation, she has not had an echocardiogram in greater than a year, will repeat echocardiogram in view of no symptoms of dizziness and to evaluate for worsening mitral regurgitation.  Office visit in 3 months.  I reviewed her external labs and updated them.  This was a 40-minute office visit encounter.    Adrian Prows, MD, [facility] 07/22/2022, [place] PM Office: [phone] Pager: [phone]

## 2022-07-22 NOTE — Telephone Encounter (Signed)
Patient calling with complaints of dizziness and unsteady balance when changing positions. This morning at her other doctors office, when sitting she was at 109/49 then standing was 94/60. After speaking with Dr. Virgina Jock, she said she should increase her hydration and see if that helps improve the symptoms. She felt as though she was plenty hydrated and just wanted to schedule an appt with Dr. Einar Gip. She is coming in today at 1:15.

## 2022-07-23 ENCOUNTER — Ambulatory Visit: Payer: Medicare Other | Admitting: Internal Medicine

## 2022-07-24 ENCOUNTER — Encounter: Payer: Medicare Other | Admitting: Physical Therapy

## 2022-07-27 ENCOUNTER — Ambulatory Visit: Payer: Medicare Other | Attending: Otolaryngology

## 2022-07-27 DIAGNOSIS — R42 Dizziness and giddiness: Secondary | ICD-10-CM | POA: Insufficient documentation

## 2022-07-27 DIAGNOSIS — R2681 Unsteadiness on feet: Secondary | ICD-10-CM | POA: Insufficient documentation

## 2022-07-27 NOTE — Therapy (Signed)
OUTPATIENT PHYSICAL THERAPY VESTIBULAR EVALUATION     Patient Name: Cynthia Poole MRN: [id number] DOB:08-04-1955, 67 y.o., female Today's Date: 07/27/2022  END OF SESSION:  PT End of Session - 07/27/22 1451     Visit Number 2    Number of Visits 9    Date for PT Re-Evaluation 08/21/22    Authorization Type Medicare    PT Start Time 1450   PATIENT LATE   PT Stop Time 1530    PT Time Calculation (min) 40 min    Activity Tolerance Patient tolerated treatment well    Behavior During Therapy [facility] for tasks assessed/performed             Past Medical History:  Diagnosis Date   Allergy    Anemia    CAD in native artery 09/29/2016   Coronary angiogram 09/29/16: Dynamic LV systolic function, EF 25%. Left dominant circulation. Mild disease in the ramus and LAD. Circumflex midsegment  90% by IVUS S/P stenting 4.0 x 18 mm Onyx DES.   DDD (degenerative disc disease), cervical    DDD (degenerative disc disease), lumbar    Depression    Heart murmur    History of pleurisy    HLD (hyperlipidemia)    Hodgkin's disease 1997   -no chemo per pt ==  Chest RT and splenectomy   Hypertension    pt denies- when walks BP elevates - no dx of HTN per pt   Hypothyroidism    no longer per pt    Insomnia    Internal and external bleeding hemorrhoids    Migraines    MVP (mitral valve prolapse)    Osteopenia    Personal history of colonic adenoma 06/29/2011   06/29/2011 - diminutive adenoma   Seasonal allergies    Shingles 2018   Vitamin D deficiency    Past Surgical History:  Procedure Laterality Date   BUNIONECTOMY     CARDIAC CATHETERIZATION  09/29/2016   COLONOSCOPY  09/02/2007   redundant colon, external hemorrhoids   COLONOSCOPY  06/29/2011   diminutive polyp, diverticulosis, external hemorrhoids   CORONARY STENT INTERVENTION N/A 09/29/2016   Procedure: Coronary Stent Intervention;  Surgeon: Adrian Prows, MD;  Location: [place] [facility];  Service: Cardiovascular;  Laterality: N/A;    DEEP NECK LYMPH NODE BIOPSY / EXCISION  1996   right LN    ESOPHAGOGASTRODUODENOSCOPY  06/29/2011   notrmal, 54 Fr dilation (dysphagia)   INTRAVASCULAR ULTRASOUND/IVUS N/A 09/29/2016   Procedure: Intravascular Ultrasound/IVUS;  Surgeon: Adrian Prows, MD;  Location: [place] [facility];  Service: Cardiovascular;  Laterality: N/A;   LEFT HEART CATH AND CORONARY ANGIOGRAPHY N/A 09/29/2016   Procedure: Left Heart Cath and Coronary Angiography;  Surgeon: Adrian Prows, MD;  Location: [place] [facility];  Service: Cardiovascular;  Laterality: N/A;   POLYPECTOMY     SPLENECTOMY, TOTAL  1996   STRABISMUS SURGERY  02/2009   left   TONSILLECTOMY     age 32-18   Patient Active Problem List   Diagnosis Date Noted   Asymptomatic stenosis of left carotid artery 02/27/2021   Anxiety and depression 02/14/2019   Lumbosacral radiculopathy at S1 01/12/2018   Coronary artery disease of native artery of native heart with stable angina pectoris ([place]) 11/02/2016   Hyperlipidemia 11/02/2016   Unstable angina pectoris ([place]) 09/29/2016   Esophageal dysmotility 07/27/2014   Personal history of colonic adenoma 06/29/2011   Lymphoma in remission ([place]) 06/14/2011    Class: Chronic   S/P splenectomy  06/14/2011    Class: Chronic   Mitral valve prolapse 06/14/2011    Class: Chronic   Low back pain 06/14/2011    Class: Chronic   Fibromyalgia 06/14/2011    Class: Chronic   IBS (irritable bowel syndrome) 06/10/2011    PCP: Donnajean Lopes, MD  REFERRING PROVIDER: Raylene Miyamoto, MD   REFERRING DIAG: R42 (ICD-10-CM) - Dizziness   THERAPY DIAG:  Unsteadiness on feet  Dizziness and giddiness  ONSET DATE: 06/09/2022 (date of referral)  Rationale for Evaluation and Treatment: Rehabilitation  SUBJECTIVE:   SUBJECTIVE STATEMENT: Patient reports doing well. Did stop taking her BP meds and does report that she's feeling less dizzy. Does still feel off balance. Does have a cardiology appt tomorrow again. Denies  falls/near falls.   Pt accompanied by: self  PERTINENT HISTORY: PMH: hyperlipidemia, DDD, depression, HTN, osteopenia, migraines, coronary artery disease status post PCI to the dominant circumflex in 2018, LBBB, history of Hodgkin's lymphoma 1997 status post chemotherapy and radiation therapy to chest   Per Dr. Benjamine Mola ENT note from 06/09/22: Pt noted to have asymmetric L ear sensorineural hearing loss, central vestibular dysfunction    PAIN:  Are you having pain? No  PRECAUTIONS: Fall and Other: monitor BP  PATIENT GOALS: To get some exercises to practice at home.   OBJECTIVE:   DIAGNOSTIC FINDINGS:   MRI brain 06/04/22:  IMPRESSION: Unremarkable MRI appearance of the brain for age. No evidence of acute intracranial abnormality.   No cerebellopontine angle or internal auditory canal mass.   No specific cause of hearing loss is identified.   VESTIBULAR ASSESSMENT:   OTHOSTATICS:  Sitting: 104/62 Standing: 101/60   SOT:  -condition 1: 3/3 pass -condition 2: 3/3 pass -condition 3: 3/3 pass -condition 4: 2/3 pass -condition 5: 2/3 pass -condition 6: 3/3 pass  Composite: 49 Below age matched norms on vestibular sensory analysis    [facility] PT Assessment - 07/27/22 0001       Functional Gait  Assessment   Gait assessed  Yes    Gait Level Surface Walks 20 ft in less than 5.5 sec, no assistive devices, good speed, no evidence for imbalance, normal gait pattern, deviates no more than 6 in outside of the 12 in walkway width.    Change in Gait Speed Able to change speed, demonstrates mild gait deviations, deviates 6-10 in outside of the 12 in walkway width, or no gait deviations, unable to achieve a major change in velocity, or uses a change in velocity, or uses an assistive device.    Gait with Horizontal Head Turns Performs head turns smoothly with slight change in gait velocity (eg, minor disruption to smooth gait path), deviates 6-10 in outside 12 in walkway width, or uses an  assistive device.    Gait with Vertical Head Turns Performs task with slight change in gait velocity (eg, minor disruption to smooth gait path), deviates 6 - 10 in outside 12 in walkway width or uses assistive device    Gait and Pivot Turn Pivot turns safely within 3 sec and stops quickly with no loss of balance.    Step Over Obstacle Is able to step over 2 stacked shoe boxes taped together (9 in total height) without changing gait speed. No evidence of imbalance.    Gait with Narrow Base of Support Ambulates 7-9 steps.    Gait with Eyes Closed Walks 20 ft, uses assistive device, slower speed, mild gait deviations, deviates 6-10 in outside 12 in walkway width.  Ambulates 20 ft in less than 9 sec but greater than 7 sec.    Ambulating Backwards Walks 20 ft, no assistive devices, good speed, no evidence for imbalance, normal gait    Steps Alternating feet, no rail.    Total Score 25             PATIENT EDUCATION: Education details: exam findings, initial HEP Person educated: Patient Education method: Explanation Education comprehension: verbalized understanding  HOME EXERCISE PROGRAM: Access Code: [email or web address] URL: [email or web address] Date: 07/27/2022 Prepared by: Estevan Ryder  Exercises - Semi-Tandem Corner Balance With Eyes Open  - 1 x daily - 7 x weekly - 3 sets - 30 hold - Semi-Tandem Corner Balance With Eyes Closed  - 1 x daily - 7 x weekly - 3 sets - 30 hold - Corner Balance Feet Together With Eyes Open  - 1 x daily - 7 x weekly - 3 sets - 30 hold - Corner Balance Feet Together With Eyes Closed  - 1 x daily - 7 x weekly - 3 sets - 30 hold - Semi-Tandem Corner Balance: Eyes Open With Head Turns  - 1 x daily - 7 x weekly - 3 sets - 30 hold - Corner Balance Feet Together: Eyes Closed With Head Turns  - 1 x daily - 7 x weekly - 3 sets - 30 hold  GOALS: Goals reviewed with patient? Yes  SHORT TERM GOALS: ALL STGS = LTGS  LONG TERM GOALS: Target date:  08/19/2022  Pt will be independent with final HEP for balance in order to build upon functional gains made in therapy. Baseline:  Goal status: INITIAL  2.  FGA goal not indicated as she scored a 25/30  Baseline: not indicated Goal status: Discontinued  3.  Patient will score at or above age- matched norms for the vestibular system sensory analysis to demonstrate improved function Baseline: below age-matched norms Goal status: REVISED  4.  Pt will improve DFS to 56 in order to demo improved functional outcomes.  Baseline: 50 Goal status: INITIAL  ASSESSMENT:  CLINICAL IMPRESSION: Patient seen for skilled PT session with emphasis on completing outcome measures and initiating HEP. Patient scoring below age-matched norms for the vestibular system on the SOT indicative of a vestibular dysfunction. Greatest difficulty with eyes closed and eyes closed + compliant surfaces. Consistent with the M-CTSIB results. Patient scoring a  25/30 on  Functional Gait Assessment.   <22/30 = predictive of falls, <20/30 = fall in 6 months, <18/30 = predictive of falls in PD MCID: 5 points stroke population, 4 points geriatric population (ANPTA Core Set of Outcome Measures for Adults with Neurologic Conditions, 2018). Continue POC.     OBJECTIVE IMPAIRMENTS: cardiopulmonary status limiting activity, decreased activity tolerance, decreased balance, and dizziness.   ACTIVITY LIMITATIONS: bending, stairs, transfers, and locomotion level  PARTICIPATION LIMITATIONS: community activity and exercise  PERSONAL FACTORS: Past/current experiences, Time since onset of injury/illness/exacerbation, and 3+ comorbidities: hyperlipidemia, DDD, depression, HTN, osteopenia, migraines, coronary artery disease status post PCI to the dominant circumflex in 2018, LBBB, history of Hodgkin's lymphoma 1997 status post chemotherapy and radiation therapy to chest, asymmetric L ear sensorineural hearing loss  are also affecting  patient's functional outcome.   REHAB POTENTIAL: Good  CLINICAL DECISION MAKING: Stable/uncomplicated  EVALUATION COMPLEXITY: Low   PLAN:  PT FREQUENCY: 2x/week  PT DURATION: 4 weeks  PLANNED INTERVENTIONS: Therapeutic exercises, Therapeutic activity, Neuromuscular re-education, Balance training, Gait training, Patient/Family education, Self Care, Vestibular training, Canalith repositioning, and Re-evaluation  PLAN  FOR NEXT SESSION: review hep, VOR. Monitor BP.    Debbora Dus, PT, DPT, CBIS 07/27/2022, 3:53 PM

## 2022-07-30 ENCOUNTER — Ambulatory Visit: Payer: Medicare Other

## 2022-07-30 DIAGNOSIS — I340 Nonrheumatic mitral (valve) insufficiency: Secondary | ICD-10-CM

## 2022-07-30 DIAGNOSIS — R55 Syncope and collapse: Secondary | ICD-10-CM

## 2022-07-30 DIAGNOSIS — I25118 Atherosclerotic heart disease of native coronary artery with other forms of angina pectoris: Secondary | ICD-10-CM

## 2022-08-03 ENCOUNTER — Ambulatory Visit: Payer: Medicare Other

## 2022-08-07 ENCOUNTER — Ambulatory Visit: Payer: Medicare Other | Admitting: Physical Therapy

## 2022-08-10 ENCOUNTER — Ambulatory Visit: Payer: Medicare Other | Admitting: Physical Therapy

## 2022-08-12 ENCOUNTER — Encounter: Payer: Self-pay | Admitting: Physical Therapy

## 2022-08-12 ENCOUNTER — Ambulatory Visit: Payer: Medicare Other | Admitting: Physical Therapy

## 2022-08-12 VITALS — BP 100/60 | HR 96

## 2022-08-12 DIAGNOSIS — R2681 Unsteadiness on feet: Secondary | ICD-10-CM | POA: Diagnosis not present

## 2022-08-12 DIAGNOSIS — R42 Dizziness and giddiness: Secondary | ICD-10-CM

## 2022-08-12 NOTE — Therapy (Signed)
OUTPATIENT PHYSICAL THERAPY VESTIBULAR TREATMENT     Patient Name: Cynthia Poole MRN: [id number] DOB:Nov 19, 1955, 67 y.o., female Today's Date: 08/12/2022  END OF SESSION:  PT End of Session - 08/12/22 1454     Visit Number 3    Number of Visits 9    Date for PT Re-Evaluation 08/21/22    Authorization Type Medicare    PT Start Time 1453   pt late to session   PT Stop Time 1530    PT Time Calculation (min) 37 min    Activity Tolerance Patient tolerated treatment well    Behavior During Therapy [facility] for tasks assessed/performed             Past Medical History:  Diagnosis Date   Allergy    Anemia    CAD in native artery 09/29/2016   Coronary angiogram 09/29/16: Dynamic LV systolic function, EF 123456. Left dominant circulation. Mild disease in the ramus and LAD. Circumflex midsegment  90% by IVUS S/P stenting 4.0 x 18 mm Onyx DES.   DDD (degenerative disc disease), cervical    DDD (degenerative disc disease), lumbar    Depression    Heart murmur    History of pleurisy    HLD (hyperlipidemia)    Hodgkin's disease 1997   -no chemo per pt ==  Chest RT and splenectomy   Hypertension    pt denies- when walks BP elevates - no dx of HTN per pt   Hypothyroidism    no longer per pt    Insomnia    Internal and external bleeding hemorrhoids    Migraines    MVP (mitral valve prolapse)    Osteopenia    Personal history of colonic adenoma 06/29/2011   06/29/2011 - diminutive adenoma   Seasonal allergies    Shingles 2018   Vitamin D deficiency    Past Surgical History:  Procedure Laterality Date   BUNIONECTOMY     CARDIAC CATHETERIZATION  09/29/2016   COLONOSCOPY  09/02/2007   redundant colon, external hemorrhoids   COLONOSCOPY  06/29/2011   diminutive polyp, diverticulosis, external hemorrhoids   CORONARY STENT INTERVENTION N/A 09/29/2016   Procedure: Coronary Stent Intervention;  Surgeon: Adrian Prows, MD;  Location: [place] [facility];  Service: Cardiovascular;  Laterality:  N/A;   DEEP NECK LYMPH NODE BIOPSY / EXCISION  1996   right LN    ESOPHAGOGASTRODUODENOSCOPY  06/29/2011   notrmal, 54 Fr dilation (dysphagia)   INTRAVASCULAR ULTRASOUND/IVUS N/A 09/29/2016   Procedure: Intravascular Ultrasound/IVUS;  Surgeon: Adrian Prows, MD;  Location: [place] [facility];  Service: Cardiovascular;  Laterality: N/A;   LEFT HEART CATH AND CORONARY ANGIOGRAPHY N/A 09/29/2016   Procedure: Left Heart Cath and Coronary Angiography;  Surgeon: Adrian Prows, MD;  Location: [place] [facility];  Service: Cardiovascular;  Laterality: N/A;   POLYPECTOMY     SPLENECTOMY, TOTAL  1996   STRABISMUS SURGERY  02/2009   left   TONSILLECTOMY     age [age]   Patient Active Problem List   Diagnosis Date Noted   Asymptomatic stenosis of left carotid artery 02/27/2021   Anxiety and depression 02/14/2019   Lumbosacral radiculopathy at S1 01/12/2018   Coronary artery disease of native artery of native heart with stable angina pectoris ([place]) 11/02/2016   Hyperlipidemia 11/02/2016   Unstable angina pectoris ([place]) 09/29/2016   Esophageal dysmotility 07/27/2014   Personal history of colonic adenoma 06/29/2011   Lymphoma in remission ([place]) 06/14/2011    Class: Chronic  S/P splenectomy 06/14/2011    Class: Chronic   Mitral valve prolapse 06/14/2011    Class: Chronic   Low back pain 06/14/2011    Class: Chronic   Fibromyalgia 06/14/2011    Class: Chronic   IBS (irritable bowel syndrome) 06/10/2011    PCP: Donnajean Lopes, MD  REFERRING PROVIDER: Raylene Miyamoto, MD   REFERRING DIAG: R42 (ICD-10-CM) - Dizziness   THERAPY DIAG:  Unsteadiness on feet  Dizziness and giddiness  ONSET DATE: 06/09/2022 (date of referral)  Rationale for Evaluation and Treatment: Rehabilitation  SUBJECTIVE:   SUBJECTIVE STATEMENT: Got a balance pad for home and has been trying some exercises, but not eyes closed or head motions. When taking her Prozac, notices that she sways more, like when she closes  her eyes. Notices feeling more off balance when turning her head, like when going to talk to someone.Pt's cardiologist wants her to get a tilt table test due to lower BP.   Pt accompanied by: self  PERTINENT HISTORY: PMH: hyperlipidemia, DDD, depression, HTN, osteopenia, migraines, coronary artery disease status post PCI to the dominant circumflex in 2018, LBBB, history of Hodgkin's lymphoma 1997 status post chemotherapy and radiation therapy to chest   Per Dr. Benjamine Mola ENT note from 06/09/22: Pt noted to have asymmetric L ear sensorineural hearing loss, central vestibular dysfunction    PAIN:  Are you having pain? No  PRECAUTIONS: Fall and Other: monitor BP  PATIENT GOALS: To get some exercises to practice at home.   OBJECTIVE:   DIAGNOSTIC FINDINGS:   MRI brain 06/04/22:  IMPRESSION: Unremarkable MRI appearance of the brain for age. No evidence of acute intracranial abnormality.   No cerebellopontine angle or internal auditory canal mass.   No specific cause of hearing loss is identified.   VESTIBULAR ASSESSMENT:   NMR:  Access Code: [email or web address] URL: [email or web address] Date: 08/12/2022 Prepared by: Janann August  Reviewed exercises from previous HEP (pt had not tried them), pt got a balance pad and has just been standing on a balance pad. Updated and revised exercises to include pt's balance pad and provided new handout:   Exercises - Semi-Tandem Corner Balance: Eyes Open With Head Turns  - 1 x daily - 5 x weekly - 2 sets - 10 hold - standing on level ground, head turns and head nods  - Semi-Tandem Corner Balance With Eyes Open  - 1 x daily - 5 x weekly - 3 sets - 30 hold - standing on pad  - Romberg Stance Eyes Closed on Foam Pad  - 1 x daily - 5 x weekly - 3 sets - 30 hold - Romberg Stance on Foam Pad  - 1 x daily - 5 x weekly - 2 sets - 10 reps - EO x10 reps head turns, x10 reps head nods  - Wide Stance with Eyes Closed and Head Rotation on Foam Pad  - 1 x  daily - 5 x weekly - 2 sets - 5 reps - and 5 reps head nods   On rockerboard in A/P direction: -Weight shifting x15 reps, pt with limited movement -10 reps head turns, 10 reps head nods with mild unsteadiness   PATIENT EDUCATION: Education details: Purpose of vestibular system for balance, importance of HEP, updated HEP now that pt has balance pad to work on unlevel vs. Just level surfaces at home and provided new handout. Discussed POC going forwards, will re-cert next week and an additional 1x week for 4 weeks  Person educated: Patient Education  method: Explanation, Demonstration, Verbal cues, and Handouts Education comprehension: verbalized understanding and returned demonstration  HOME EXERCISE PROGRAM: Access Code: [email or web address] URL: [email or web address] Date: 08/12/2022 Prepared by: Janann August  Exercises - Semi-Tandem Corner Balance: Eyes Open With Head Turns  - 1 x daily - 5 x weekly - 2 sets - 10 hold - Semi-Tandem Corner Balance With Eyes Open  - 1 x daily - 5 x weekly - 3 sets - 30 hold - Romberg Stance Eyes Closed on Foam Pad  - 1 x daily - 5 x weekly - 3 sets - 30 hold - Romberg Stance on Foam Pad  - 1 x daily - 5 x weekly - 2 sets - 10 reps - Wide Stance with Eyes Closed and Head Rotation on Foam Pad  - 1 x daily - 5 x weekly - 2 sets - 5 reps  GOALS: Goals reviewed with patient? Yes  SHORT TERM GOALS: ALL STGS = LTGS  LONG TERM GOALS: Target date: 08/19/2022  Pt will be independent with final HEP for balance in order to build upon functional gains made in therapy. Baseline:  Goal status: INITIAL  2.  FGA goal not indicated as she scored a 25/30  Baseline: not indicated Goal status: Discontinued  3.  Patient will score at or above age- matched norms for the vestibular system sensory analysis to demonstrate improved function Baseline: below age-matched norms Goal status: REVISED  4.  Pt will improve DFS to 56 in order to demo improved functional  outcomes.  Baseline: 50 Goal status: INITIAL  ASSESSMENT:  CLINICAL IMPRESSION: Today's skilled session focused on reviewing previous HEP and updating to being on compliant surfaces now that pt has purchased a balance pad for incr vestibular input. Pt had not gotten the chance to perform at home. Pt continues to be challenged with balance with EC and head turns. Will continue to progress towards LTGs.     OBJECTIVE IMPAIRMENTS: cardiopulmonary status limiting activity, decreased activity tolerance, decreased balance, and dizziness.   ACTIVITY LIMITATIONS: bending, stairs, transfers, and locomotion level  PARTICIPATION LIMITATIONS: community activity and exercise  PERSONAL FACTORS: Past/current experiences, Time since onset of injury/illness/exacerbation, and 3+ comorbidities: hyperlipidemia, DDD, depression, HTN, osteopenia, migraines, coronary artery disease status post PCI to the dominant circumflex in 2018, LBBB, history of Hodgkin's lymphoma 1997 status post chemotherapy and radiation therapy to chest, asymmetric L ear sensorineural hearing loss  are also affecting patient's functional outcome.   REHAB POTENTIAL: Good  CLINICAL DECISION MAKING: Stable/uncomplicated  EVALUATION COMPLEXITY: Low   PLAN:  PT FREQUENCY: 2x/week  PT DURATION: 4 weeks  PLANNED INTERVENTIONS: Therapeutic exercises, Therapeutic activity, Neuromuscular re-education, Balance training, Gait training, Patient/Family education, Self Care, Vestibular training, Canalith repositioning, and Re-evaluation  PLAN FOR NEXT SESSION: update HEP as needed, standing VOR, balance with vestibular input   Plan to do re-cert next week for an additional 1x week for 4 weeks due to some missed appts.    Arliss Journey, PT, DPT 08/12/2022, 3:50 PM

## 2022-08-17 ENCOUNTER — Ambulatory Visit: Payer: Medicare Other

## 2022-08-17 DIAGNOSIS — R2681 Unsteadiness on feet: Secondary | ICD-10-CM

## 2022-08-17 DIAGNOSIS — R42 Dizziness and giddiness: Secondary | ICD-10-CM

## 2022-08-17 NOTE — Therapy (Signed)
OUTPATIENT PHYSICAL THERAPY VESTIBULAR TREATMENT     Patient Name: Cynthia Poole MRN: [id number] DOB:December 08, 1955, 67 y.o., female Today's Date: 08/17/2022  END OF SESSION:  PT End of Session - 08/17/22 1411     Visit Number 4    Number of Visits 13   re-cert   Date for PT Re-Evaluation 09/18/22    Authorization Type Medicare    PT Start Time 1410   patient late   PT Stop Time [id number]    PT Time Calculation (min) 35 min    Activity Tolerance Patient tolerated treatment well    Behavior During Therapy [facility] for tasks assessed/performed             Past Medical History:  Diagnosis Date   Allergy    Anemia    CAD in native artery 09/29/2016   Coronary angiogram 09/29/16: Dynamic LV systolic function, EF 123456. Left dominant circulation. Mild disease in the ramus and LAD. Circumflex midsegment  90% by IVUS S/P stenting 4.0 x 18 mm Onyx DES.   DDD (degenerative disc disease), cervical    DDD (degenerative disc disease), lumbar    Depression    Heart murmur    History of pleurisy    HLD (hyperlipidemia)    Hodgkin's disease 1997   -no chemo per pt ==  Chest RT and splenectomy   Hypertension    pt denies- when walks BP elevates - no dx of HTN per pt   Hypothyroidism    no longer per pt    Insomnia    Internal and external bleeding hemorrhoids    Migraines    MVP (mitral valve prolapse)    Osteopenia    Personal history of colonic adenoma 06/29/2011   06/29/2011 - diminutive adenoma   Seasonal allergies    Shingles 2018   Vitamin D deficiency    Past Surgical History:  Procedure Laterality Date   BUNIONECTOMY     CARDIAC CATHETERIZATION  09/29/2016   COLONOSCOPY  09/02/2007   redundant colon, external hemorrhoids   COLONOSCOPY  06/29/2011   diminutive polyp, diverticulosis, external hemorrhoids   CORONARY STENT INTERVENTION N/A 09/29/2016   Procedure: Coronary Stent Intervention;  Surgeon: Adrian Prows, MD;  Location: [place] [facility];  Service: Cardiovascular;   Laterality: N/A;   DEEP NECK LYMPH NODE BIOPSY / EXCISION  1996   right LN    ESOPHAGOGASTRODUODENOSCOPY  06/29/2011   notrmal, 54 Fr dilation (dysphagia)   INTRAVASCULAR ULTRASOUND/IVUS N/A 09/29/2016   Procedure: Intravascular Ultrasound/IVUS;  Surgeon: Adrian Prows, MD;  Location: [place] [facility];  Service: Cardiovascular;  Laterality: N/A;   LEFT HEART CATH AND CORONARY ANGIOGRAPHY N/A 09/29/2016   Procedure: Left Heart Cath and Coronary Angiography;  Surgeon: Adrian Prows, MD;  Location: [place] [facility];  Service: Cardiovascular;  Laterality: N/A;   POLYPECTOMY     SPLENECTOMY, TOTAL  1996   STRABISMUS SURGERY  02/2009   left   TONSILLECTOMY     age [age]   Patient Active Problem List   Diagnosis Date Noted   Asymptomatic stenosis of left carotid artery 02/27/2021   Anxiety and depression 02/14/2019   Lumbosacral radiculopathy at S1 01/12/2018   Coronary artery disease of native artery of native heart with stable angina pectoris ([place]) 11/02/2016   Hyperlipidemia 11/02/2016   Unstable angina pectoris ([place]) 09/29/2016   Esophageal dysmotility 07/27/2014   Personal history of colonic adenoma 06/29/2011   Lymphoma in remission ([place]) 06/14/2011    Class: Chronic  S/P splenectomy 06/14/2011    Class: Chronic   Mitral valve prolapse 06/14/2011    Class: Chronic   Low back pain 06/14/2011    Class: Chronic   Fibromyalgia 06/14/2011    Class: Chronic   IBS (irritable bowel syndrome) 06/10/2011    PCP: Donnajean Lopes, MD  REFERRING PROVIDER: Raylene Miyamoto, MD   REFERRING DIAG: R42 (ICD-10-CM) - Dizziness   THERAPY DIAG:  Unsteadiness on feet  Dizziness and giddiness  ONSET DATE: 06/09/2022 (date of referral)  Rationale for Evaluation and Treatment: Rehabilitation  SUBJECTIVE:   SUBJECTIVE STATEMENT: Patient reports doing fair. Hasn't done her exercises much, reports limited motivation. Denies falls/near falls.   Pt accompanied by: self  PERTINENT HISTORY:  PMH: hyperlipidemia, DDD, depression, HTN, osteopenia, migraines, coronary artery disease status post PCI to the dominant circumflex in 2018, LBBB, history of Hodgkin's lymphoma 1997 status post chemotherapy and radiation therapy to chest   Per Dr. Benjamine Mola ENT note from 06/09/22: Pt noted to have asymmetric L ear sensorineural hearing loss, central vestibular dysfunction    PAIN:  Are you having pain? No  PRECAUTIONS: Fall and Other: monitor BP  PATIENT GOALS: To get some exercises to practice at home.   OBJECTIVE:   DIAGNOSTIC FINDINGS:   MRI brain 06/04/22:  IMPRESSION: Unremarkable MRI appearance of the brain for age. No evidence of acute intracranial abnormality.   No cerebellopontine angle or internal auditory canal mass.   No specific cause of hearing loss is identified.   VESTIBULAR TREATMENT: BP seated: 130/72 (HR: 72) BP standing: 136/78 (HR: 78)   FOTO: 55  Theract:  -lateral stepping on foam balance beam  -tandem gait fwd on foam balance beam  -ant/post step over airex U LE with intermittent UE support  -ant/post step over rockerboard A/P with intermittent UE support  -seated ankle coordination on wobble board  -sit <> stand on airex with normal BOS -> NBOS EO, EC  PATIENT EDUCATION: Education details: PT POC Person educated: Patient Education method: Consulting civil engineer, Media planner, Verbal cues, and Handouts Education comprehension: verbalized understanding and returned demonstration  HOME EXERCISE PROGRAM: Access Code: [email or web address] URL: [email or web address] Date: 08/12/2022 Prepared by: Janann August  Exercises - Semi-Tandem Corner Balance: Eyes Open With Head Turns  - 1 x daily - 5 x weekly - 2 sets - 10 hold - Semi-Tandem Corner Balance With Eyes Open  - 1 x daily - 5 x weekly - 3 sets - 30 hold - Romberg Stance Eyes Closed on Foam Pad  - 1 x daily - 5 x weekly - 3 sets - 30 hold - Romberg Stance on Foam Pad  - 1 x daily - 5 x weekly - 2 sets  - 10 reps - Wide Stance with Eyes Closed and Head Rotation on Foam Pad  - 1 x daily - 5 x weekly - 2 sets - 5 reps  GOALS: Goals reviewed with patient? Yes  SHORT TERM GOALS: ALL STGS = LTGS  LONG TERM GOALS: Target date: 08/19/2022  Pt will be independent with final HEP for balance in order to build upon functional gains made in therapy. Baseline: intermittently compliant  Goal status: IN PROGRESS  2.  FGA goal not indicated as she scored a 25/30  Baseline: not indicated Goal status: Discontinued  3.  Patient will score at or above age- matched norms for the vestibular system sensory analysis to demonstrate improved function Baseline: below age-matched norms Goal status: REVISED  4.  Pt will improve DFS to 56  in order to demo improved functional outcomes.  Baseline: 50, 55 Goal status: IN PROGRESS  NEW LONG TERM GOALS: Target date: 09/18/2022  Pt will be independent with final HEP for balance in order to build upon functional gains made in therapy. Baseline: intermittently compliant  Goal status: IN PROGRESS   2.  Patient will score at or above age- matched norms for the vestibular system sensory analysis to demonstrate improved function Baseline: below age-matched norms Goal status: REVISED  3.  Pt will improve DFS to 56 in order to demo improved functional outcomes.  Baseline: 50, 55 Goal status: IN PROGRESS  ASSESSMENT:  CLINICAL IMPRESSION: Patient seen for skilled PT session with emphasis on balance retraining. Patient reporting that she is not entirely compliant with HEP and thus has not noticed significant improvements in her balance. BP was appropriate in clinic today. Discussed that patient should adhere to HEP to begin to see some improvements in balance. Patient interested in doing sit <> stand exercises on airex at home, though not explicitly put on her HEP. PT instructing patient to use a chair braced against and wall and have something sturdy in front of her  in case she were to lose her balance. Patient verbalized understanding. Continue POC.     OBJECTIVE IMPAIRMENTS: cardiopulmonary status limiting activity, decreased activity tolerance, decreased balance, and dizziness.   ACTIVITY LIMITATIONS: bending, stairs, transfers, and locomotion level  PARTICIPATION LIMITATIONS: community activity and exercise  PERSONAL FACTORS: Past/current experiences, Time since onset of injury/illness/exacerbation, and 3+ comorbidities: hyperlipidemia, DDD, depression, HTN, osteopenia, migraines, coronary artery disease status post PCI to the dominant circumflex in 2018, LBBB, history of Hodgkin's lymphoma 1997 status post chemotherapy and radiation therapy to chest, asymmetric L ear sensorineural hearing loss  are also affecting patient's functional outcome.   REHAB POTENTIAL: Good  CLINICAL DECISION MAKING: Stable/uncomplicated  EVALUATION COMPLEXITY: Low   PLAN:  PT FREQUENCY: 2x/week 1x a week (re-cert)  PT DURATION: 4 weeks 4 weeks (re-cert)   PLANNED INTERVENTIONS: Therapeutic exercises, Therapeutic activity, Neuromuscular re-education, Balance training, Gait training, Patient/Family education, Self Care, Vestibular training, Canalith repositioning, and Re-evaluation  PLAN FOR NEXT SESSION: update HEP as needed, standing VOR, balance with vestibular input   Debbora Dus, PT, DPT, CBIS 08/17/2022, 2:59 PM

## 2022-08-19 ENCOUNTER — Ambulatory Visit: Payer: Medicare Other | Admitting: Physical Therapy

## 2022-08-24 ENCOUNTER — Ambulatory Visit: Payer: Medicare Other | Attending: Otolaryngology

## 2022-08-24 DIAGNOSIS — R2681 Unsteadiness on feet: Secondary | ICD-10-CM | POA: Insufficient documentation

## 2022-08-24 DIAGNOSIS — R42 Dizziness and giddiness: Secondary | ICD-10-CM | POA: Diagnosis present

## 2022-08-24 NOTE — Therapy (Signed)
OUTPATIENT PHYSICAL THERAPY VESTIBULAR TREATMENT     Patient Name: Cynthia Poole MRN: [id number] DOB:12-19-1955, 67 y.o., female Today's Date: 08/24/2022  END OF SESSION:  PT End of Session - 08/24/22 1533     Visit Number 5    Number of Visits 13    Date for PT Re-Evaluation 09/18/22    Authorization Type Medicare    PT Start Time 1533   patient late   PT Stop Time 1613    PT Time Calculation (min) 40 min    Equipment Utilized During Treatment Gait belt    Activity Tolerance Patient tolerated treatment well    Behavior During Therapy WFL for tasks assessed/performed             Past Medical History:  Diagnosis Date   Allergy    Anemia    CAD in native artery 09/29/2016   Coronary angiogram 09/29/16: Dynamic LV systolic function, EF 123456. Left dominant circulation. Mild disease in the ramus and LAD. Circumflex midsegment  90% by IVUS S/P stenting 4.0 x 18 mm Onyx DES.   DDD (degenerative disc disease), cervical    DDD (degenerative disc disease), lumbar    Depression    Heart murmur    History of pleurisy    HLD (hyperlipidemia)    Hodgkin's disease 1997   -no chemo per pt ==  Chest RT and splenectomy   Hypertension    pt denies- when walks BP elevates - no dx of HTN per pt   Hypothyroidism    no longer per pt    Insomnia    Internal and external bleeding hemorrhoids    Migraines    MVP (mitral valve prolapse)    Osteopenia    Personal history of colonic adenoma 06/29/2011   06/29/2011 - diminutive adenoma   Seasonal allergies    Shingles 2018   Vitamin D deficiency    Past Surgical History:  Procedure Laterality Date   BUNIONECTOMY     CARDIAC CATHETERIZATION  09/29/2016   COLONOSCOPY  09/02/2007   redundant colon, external hemorrhoids   COLONOSCOPY  06/29/2011   diminutive polyp, diverticulosis, external hemorrhoids   CORONARY STENT INTERVENTION N/A 09/29/2016   Procedure: Coronary Stent Intervention;  Surgeon: Adrian Prows, MD;  Location: [facility];  Service: Cardiovascular;  Laterality: N/A;   DEEP NECK LYMPH NODE BIOPSY / EXCISION  1996   right LN    ESOPHAGOGASTRODUODENOSCOPY  06/29/2011   notrmal, 54 Fr dilation (dysphagia)   INTRAVASCULAR ULTRASOUND/IVUS N/A 09/29/2016   Procedure: Intravascular Ultrasound/IVUS;  Surgeon: Adrian Prows, MD;  Location: [facility];  Service: Cardiovascular;  Laterality: N/A;   LEFT HEART CATH AND CORONARY ANGIOGRAPHY N/A 09/29/2016   Procedure: Left Heart Cath and Coronary Angiography;  Surgeon: Adrian Prows, MD;  Location: [facility];  Service: Cardiovascular;  Laterality: N/A;   POLYPECTOMY     SPLENECTOMY, TOTAL  1996   STRABISMUS SURGERY  02/2009   left   TONSILLECTOMY     age 39-18   Patient Active Problem List   Diagnosis Date Noted   Asymptomatic stenosis of left carotid artery 02/27/2021   Anxiety and depression 02/14/2019   Lumbosacral radiculopathy at S1 01/12/2018   Coronary artery disease of native artery of native heart with stable angina pectoris ([place]) 11/02/2016   Hyperlipidemia 11/02/2016   Unstable angina pectoris ([place]) 09/29/2016   Esophageal dysmotility 07/27/2014   Personal history of colonic adenoma 06/29/2011   Lymphoma in remission ([place]) 06/14/2011  Class: Chronic   S/P splenectomy 06/14/2011    Class: Chronic   Mitral valve prolapse 06/14/2011    Class: Chronic   Low back pain 06/14/2011    Class: Chronic   Fibromyalgia 06/14/2011    Class: Chronic   IBS (irritable bowel syndrome) 06/10/2011    PCP: Donnajean Lopes, MD  REFERRING PROVIDER: Raylene Miyamoto, MD   REFERRING DIAG: R42 (ICD-10-CM) - Dizziness   THERAPY DIAG:  Unsteadiness on feet  Dizziness and giddiness  ONSET DATE: 06/09/2022 (date of referral)  Rationale for Evaluation and Treatment: Rehabilitation  SUBJECTIVE:   SUBJECTIVE STATEMENT: Patient reports doing well. Not feeling too dizzy, but BP slightly elevated today. Does still wake up with dizzy/light headed  feeling. Is not taking any BP rx at this point in time. Denies falls/near falls.   Pt accompanied by: self  PERTINENT HISTORY: PMH: hyperlipidemia, DDD, depression, HTN, osteopenia, migraines, coronary artery disease status post PCI to the dominant circumflex in 2018, LBBB, history of Hodgkin's lymphoma 1997 status post chemotherapy and radiation therapy to chest   Per Dr. Benjamine Mola ENT note from 06/09/22: Pt noted to have asymmetric L ear sensorineural hearing loss, central vestibular dysfunction    PAIN:  Are you having pain? No  PRECAUTIONS: Fall and Other: monitor BP  PATIENT GOALS: To get some exercises to practice at home.   OBJECTIVE:   DIAGNOSTIC FINDINGS:   MRI brain 06/04/22:  IMPRESSION: Unremarkable MRI appearance of the brain for age. No evidence of acute intracranial abnormality.   No cerebellopontine angle or internal auditory canal mass.   No specific cause of hearing loss is identified.   VESTIBULAR TREATMENT: BP seated: 143/76 (HR: 75)  BP standing: 122/73 (HR: 80)  NMR:  -limits of stability neurocom x2  (Limited to the R and anterior) -EO/EC on A/P rockerboard with smooth A/P weight shift  -EC head turns on A/P rockerboard -230' diagonal ball tracking with shortened B stride length apart and general instability   PATIENT EDUCATION: Education details: continue HEP Person educated: Patient Education method: Explanation, Demonstration, Verbal cues, and Handouts Education comprehension: verbalized understanding and returned demonstration  HOME EXERCISE PROGRAM: Access Code: [email or web address] URL: [email or web address] Date: 08/12/2022 Prepared by: Janann August  Exercises - Semi-Tandem Corner Balance: Eyes Open With Head Turns  - 1 x daily - 5 x weekly - 2 sets - 10 hold - Semi-Tandem Corner Balance With Eyes Open  - 1 x daily - 5 x weekly - 3 sets - 30 hold - Romberg Stance Eyes Closed on Foam Pad  - 1 x daily - 5 x weekly - 3 sets - 30  hold - Romberg Stance on Foam Pad  - 1 x daily - 5 x weekly - 2 sets - 10 reps - Wide Stance with Eyes Closed and Head Rotation on Foam Pad  - 1 x daily - 5 x weekly - 2 sets - 5 reps  GOALS: Goals reviewed with patient? Yes  SHORT TERM GOALS: ALL STGS = LTGS  LONG TERM GOALS: Target date: 08/19/2022  Pt will be independent with final HEP for balance in order to build upon functional gains made in therapy. Baseline: intermittently compliant  Goal status: IN PROGRESS  2.  FGA goal not indicated as she scored a 25/30  Baseline: not indicated Goal status: Discontinued  3.  Patient will score at or above age- matched norms for the vestibular system sensory analysis to demonstrate improved function Baseline: below age-matched norms Goal status: REVISED  4.  Pt will improve DFS to 56 in order to demo improved functional outcomes.  Baseline: 50, 55 Goal status: IN PROGRESS  NEW LONG TERM GOALS: Target date: 09/18/2022  Pt will be independent with final HEP for balance in order to build upon functional gains made in therapy. Baseline: intermittently compliant  Goal status: IN PROGRESS   2.  Patient will score at or above age- matched norms for the vestibular system sensory analysis to demonstrate improved function Baseline: below age-matched norms Goal status: REVISED  3.  Pt will improve DFS to 56 in order to demo improved functional outcomes.  Baseline: 50, 55 Goal status: IN PROGRESS  ASSESSMENT:  CLINICAL IMPRESSION: Patient seen for skilled PT session with emphasis on balance retraining. BP continues to fluctuates, but seems to be higher than it was when she first started PT. Patient reports taking herself off all of her BP rx. Increased LOB with EC on compliant surfaces with preference for hip strategy > ankle strategy, but limited end point excursion as demonstrated on neurocom task. Continue POC.    OBJECTIVE IMPAIRMENTS: cardiopulmonary status limiting activity,  decreased activity tolerance, decreased balance, and dizziness.   ACTIVITY LIMITATIONS: bending, stairs, transfers, and locomotion level  PARTICIPATION LIMITATIONS: community activity and exercise  PERSONAL FACTORS: Past/current experiences, Time since onset of injury/illness/exacerbation, and 3+ comorbidities: hyperlipidemia, DDD, depression, HTN, osteopenia, migraines, coronary artery disease status post PCI to the dominant circumflex in 2018, LBBB, history of Hodgkin's lymphoma 1997 status post chemotherapy and radiation therapy to chest, asymmetric L ear sensorineural hearing loss  are also affecting patient's functional outcome.   REHAB POTENTIAL: Good  CLINICAL DECISION MAKING: Stable/uncomplicated  EVALUATION COMPLEXITY: Low   PLAN:  PT FREQUENCY: 2x/week 1x a week (re-cert)  PT DURATION: 4 weeks 4 weeks (re-cert)   PLANNED INTERVENTIONS: Therapeutic exercises, Therapeutic activity, Neuromuscular re-education, Balance training, Gait training, Patient/Family education, Self Care, Vestibular training, Canalith repositioning, and Re-evaluation  PLAN FOR NEXT SESSION: update HEP as needed, standing VOR, balance with vestibular input   Debbora Dus, PT, DPT, CBIS 08/24/2022, 4:15 PM

## 2022-08-31 ENCOUNTER — Encounter: Payer: Self-pay | Admitting: Physical Therapy

## 2022-08-31 ENCOUNTER — Ambulatory Visit: Payer: Medicare Other | Admitting: Physical Therapy

## 2022-08-31 VITALS — BP 119/62 | HR 98

## 2022-08-31 DIAGNOSIS — R42 Dizziness and giddiness: Secondary | ICD-10-CM

## 2022-08-31 DIAGNOSIS — R2681 Unsteadiness on feet: Secondary | ICD-10-CM

## 2022-08-31 NOTE — Therapy (Signed)
OUTPATIENT PHYSICAL THERAPY VESTIBULAR TREATMENT     Patient Name: Cynthia Poole MRN: [id number] DOB:[birth year], 67 y.o., female Today's Date: 09/01/2022  END OF SESSION:  PT End of Session - 08/31/22 1539     Visit Number 6    Number of Visits 13    Date for PT Re-Evaluation 09/18/22    Authorization Type Medicare    PT Start Time 1538   pt late to session   PT Stop Time 1615    PT Time Calculation (min) 37 min    Equipment Utilized During Treatment Gait belt    Activity Tolerance Patient tolerated treatment well    Behavior During Therapy WFL for tasks assessed/performed             Past Medical History:  Diagnosis Date   Allergy    Anemia    CAD in native artery 09/29/2016   Coronary angiogram 09/29/16: Dynamic LV systolic function, EF 123456. Left dominant circulation. Mild disease in the ramus and LAD. Circumflex midsegment  90% by IVUS S/P stenting 4.0 x 18 mm Onyx DES.   DDD (degenerative disc disease), cervical    DDD (degenerative disc disease), lumbar    Depression    Heart murmur    History of pleurisy    HLD (hyperlipidemia)    Hodgkin's disease 1997   -no chemo per pt ==  Chest RT and splenectomy   Hypertension    pt denies- when walks BP elevates - no dx of HTN per pt   Hypothyroidism    no longer per pt    Insomnia    Internal and external bleeding hemorrhoids    Migraines    MVP (mitral valve prolapse)    Osteopenia    Personal history of colonic adenoma 06/29/2011   06/29/2011 - diminutive adenoma   Seasonal allergies    Shingles 2018   Vitamin D deficiency    Past Surgical History:  Procedure Laterality Date   BUNIONECTOMY     CARDIAC CATHETERIZATION  09/29/2016   COLONOSCOPY  09/02/2007   redundant colon, external hemorrhoids   COLONOSCOPY  06/29/2011   diminutive polyp, diverticulosis, external hemorrhoids   CORONARY STENT INTERVENTION N/A 09/29/2016   Procedure: Coronary Stent Intervention;  Surgeon: Adrian Prows, MD;  Location: [facility];  Service: Cardiovascular;  Laterality: N/A;   DEEP NECK LYMPH NODE BIOPSY / EXCISION  1996   right LN    ESOPHAGOGASTRODUODENOSCOPY  06/29/2011   notrmal, 54 Fr dilation (dysphagia)   INTRAVASCULAR ULTRASOUND/IVUS N/A 09/29/2016   Procedure: Intravascular Ultrasound/IVUS;  Surgeon: Adrian Prows, MD;  Location: [facility];  Service: Cardiovascular;  Laterality: N/A;   LEFT HEART CATH AND CORONARY ANGIOGRAPHY N/A 09/29/2016   Procedure: Left Heart Cath and Coronary Angiography;  Surgeon: Adrian Prows, MD;  Location: [facility];  Service: Cardiovascular;  Laterality: N/A;   POLYPECTOMY     SPLENECTOMY, TOTAL  1996   STRABISMUS SURGERY  02/2009   left   TONSILLECTOMY     age [age]   Patient Active Problem List   Diagnosis Date Noted   Asymptomatic stenosis of left carotid artery 02/27/2021   Anxiety and depression 02/14/2019   Lumbosacral radiculopathy at S1 01/12/2018   Coronary artery disease of native artery of native heart with stable angina pectoris ([place]) 11/02/2016   Hyperlipidemia 11/02/2016   Unstable angina pectoris ([place]) 09/29/2016   Esophageal dysmotility 07/27/2014   Personal history of colonic adenoma 06/29/2011   Lymphoma in remission (  [place]) 06/14/2011    Class: Chronic   S/P splenectomy 06/14/2011    Class: Chronic   Mitral valve prolapse 06/14/2011    Class: Chronic   Low back pain 06/14/2011    Class: Chronic   Fibromyalgia 06/14/2011    Class: Chronic   IBS (irritable bowel syndrome) 06/10/2011    PCP: Donnajean Lopes, MD  REFERRING PROVIDER: Raylene Miyamoto, MD   REFERRING DIAG: R42 (ICD-10-CM) - Dizziness   THERAPY DIAG:  Unsteadiness on feet  Dizziness and giddiness  ONSET DATE: 06/09/2022 (date of referral)  Rationale for Evaluation and Treatment: Rehabilitation  SUBJECTIVE:   SUBJECTIVE STATEMENT: Has been working at her exercises at home. Still having difficulty with closing her eyes closed. Still feeling like  balance is off. Not really sure if PT is helping or not. Feels like exercises are getting too easy.   Pt accompanied by: self  PERTINENT HISTORY: PMH: hyperlipidemia, DDD, depression, HTN, osteopenia, migraines, coronary artery disease status post PCI to the dominant circumflex in 2018, LBBB, history of Hodgkin's lymphoma 1997 status post chemotherapy and radiation therapy to chest   Per Dr. Benjamine Mola ENT note from 06/09/22: Pt noted to have asymmetric L ear sensorineural hearing loss, central vestibular dysfunction    PAIN:  Are you having pain? No Vitals:   08/31/22 1545 08/31/22 1547 08/31/22 1610  BP: (!) 98/57 (!) 93/56 119/62  Pulse: 94 96 98    Seated, Standing, Standing    PRECAUTIONS: Fall and Other: monitor BP  PATIENT GOALS: To get some exercises to practice at home.   OBJECTIVE:   DIAGNOSTIC FINDINGS:   MRI brain 06/04/22:  IMPRESSION: Unremarkable MRI appearance of the brain for age. No evidence of acute intracranial abnormality.   No cerebellopontine angle or internal auditory canal mass.   No specific cause of hearing loss is identified.   VESTIBULAR TREATMENT:  Standing in corner on air ex with EC: Alternating marching with EC x10 reps each side  Forward gait with EC down and back in hallway over 20' x2 reps with pt tendency to veer to the R  Forward gait with holding ball and making CW circles 2 x 20' for improved gaze stabilization, pt then reporting feeling dizzy afterwards.   Access Code: [email or web address] URL: [email or web address] Date: 08/31/2022 Prepared by: Janann August  Reviewed and upgraded pt's HEP per pt request as she reported some exercises were getting easy. See MedBridge for further details.   Exercises - Romberg Stance Eyes Closed on Foam Pad  - 1 x daily - 5 x weekly - 3 sets - 30 hold - with feet together  - Romberg Stance on Foam Pad  - 1 x daily - 5 x weekly - 2 sets - 10 reps - performed with EO 2 x10 reps head turns, 2 x10  reps head nods  - Semi-Tandem Corner Balance: Eyes Open With Head Turns  - 1 x daily - 5 x weekly - 2 sets - 10 hold - progressed to standing on balance pad  - Narrow Stance with Eyes Closed and Head Nods on Foam Pad  - 1 x daily - 5 x weekly - 2 sets - 10 reps - progressed to more narrow BOS and x10 reps head turns   New additions on 08/31/22 to perform in hallway: pt more off balance with head nods  - Walking with Head Nod  - 2 x daily - 5 x weekly - 3 sets - Walking with Head Rotation  - 2  x daily - 5 x weekly - 3 sets  Therapeutic Activity: Assessed BP at start of session with pt having low BP in seated and standing. Pt reporting feeling some lightheadedness today (even earlier today), but still reports that she feels fine to participate in PT as this is not abnormal for her. At end of session pt reported dizziness during gait exercise and assessed BP with BP WNL (see above for further vital information). Pt reports that she recently took herself off her BP medication without telling her doctor. Educated that pt needs to consult with her doctor before making any medication changes on her own.    PATIENT EDUCATION: Education details: See therapeutic activity section. Reviewed and progressed HEP  Person educated: Patient Education method: Explanation, Demonstration, and Verbal cues Education comprehension: verbalized understanding and returned demonstration  HOME EXERCISE PROGRAM: Access Code: [email or web address] URL: [email or web address] Date: 08/31/2022 Prepared by: Janann August  Exercises - Romberg Stance Eyes Closed on Foam Pad  - 1 x daily - 5 x weekly - 3 sets - 30 hold - Romberg Stance on Foam Pad  - 1 x daily - 5 x weekly - 2 sets - 10 reps - Semi-Tandem Corner Balance: Eyes Open With Head Turns  - 1 x daily - 5 x weekly - 2 sets - 10 hold - Narrow Stance with Eyes Closed and Head Nods on Foam Pad  - 1 x daily - 5 x weekly - 2 sets - 10 reps - Walking with Head Nod  - 2 x  daily - 5 x weekly - 3 sets - Walking with Head Rotation  - 2 x daily - 5 x weekly - 3 sets  GOALS: Goals reviewed with patient? Yes  SHORT TERM GOALS: ALL STGS = LTGS  LONG TERM GOALS: Target date: 08/19/2022  Pt will be independent with final HEP for balance in order to build upon functional gains made in therapy. Baseline: intermittently compliant  Goal status: IN PROGRESS  2.  FGA goal not indicated as she scored a 25/30  Baseline: not indicated Goal status: Discontinued  3.  Patient will score at or above age- matched norms for the vestibular system sensory analysis to demonstrate improved function Baseline: below age-matched norms Goal status: REVISED  4.  Pt will improve DFS to 56 in order to demo improved functional outcomes.  Baseline: 50, 55 Goal status: IN PROGRESS  NEW LONG TERM GOALS: Target date: 09/18/2022  Pt will be independent with final HEP for balance in order to build upon functional gains made in therapy. Baseline: intermittently compliant  Goal status: IN PROGRESS   2.  Patient will score at or above age- matched norms for the vestibular system sensory analysis to demonstrate improved function Baseline: below age-matched norms Goal status: REVISED  3.  Pt will improve DFS to 56 in order to demo improved functional outcomes.  Baseline: 50, 55 Goal status: IN PROGRESS  ASSESSMENT:  CLINICAL IMPRESSION: Pt with lower BP at start of session with pt reporting some lightheadedness, but pt still wanting to participate in PT (pt reports that this is her norm). Pt's BP incr at end of session after activity. Educated that pt's low and fluctuating BP could also be contributing to her balance and feeling unsteady. Educated to continue to follow up with cardiology regarding this. Worked on updating pt's HEP to progress balance with EC and adding in gait with head motions in hallway. Pt more challenged with head turns. Will continue per POC.  OBJECTIVE  IMPAIRMENTS: cardiopulmonary status limiting activity, decreased activity tolerance, decreased balance, and dizziness.   ACTIVITY LIMITATIONS: bending, stairs, transfers, and locomotion level  PARTICIPATION LIMITATIONS: community activity and exercise  PERSONAL FACTORS: Past/current experiences, Time since onset of injury/illness/exacerbation, and 3+ comorbidities: hyperlipidemia, DDD, depression, HTN, osteopenia, migraines, coronary artery disease status post PCI to the dominant circumflex in 2018, LBBB, history of Hodgkin's lymphoma 1997 status post chemotherapy and radiation therapy to chest, asymmetric L ear sensorineural hearing loss  are also affecting patient's functional outcome.   REHAB POTENTIAL: Good  CLINICAL DECISION MAKING: Stable/uncomplicated  EVALUATION COMPLEXITY: Low   PLAN:  PT FREQUENCY: 2x/week 1x a week (re-cert)  PT DURATION: 4 weeks 4 weeks (re-cert)   PLANNED INTERVENTIONS: Therapeutic exercises, Therapeutic activity, Neuromuscular re-education, Balance training, Gait training, Patient/Family education, Self Care, Vestibular training, Canalith repositioning, and Re-evaluation  PLAN FOR NEXT SESSION: check HEP. update HEP as needed, standing VOR, balance with vestibular input and head motions.  Janann August, PT, DPT 09/01/22 8:42 AM

## 2022-09-07 ENCOUNTER — Telehealth: Payer: Self-pay | Admitting: Internal Medicine

## 2022-09-07 ENCOUNTER — Ambulatory Visit: Payer: Medicare Other

## 2022-09-07 ENCOUNTER — Ambulatory Visit: Payer: Medicare Other | Admitting: Internal Medicine

## 2022-09-07 DIAGNOSIS — R2681 Unsteadiness on feet: Secondary | ICD-10-CM | POA: Diagnosis not present

## 2022-09-07 DIAGNOSIS — R42 Dizziness and giddiness: Secondary | ICD-10-CM

## 2022-09-07 NOTE — Telephone Encounter (Signed)
PT had an appointment for today at 1110 and wanted to cancel. I tried to back out of cancelling it but it wouldn't let me stop the progress. THIS WAS A SAME DAY CANCELLATION.

## 2022-09-07 NOTE — Therapy (Signed)
OUTPATIENT PHYSICAL THERAPY VESTIBULAR TREATMENT     Patient Name: Cynthia Poole MRN: [id number] DOB:[birth year], 67 y.o., female Today's Date: 09/07/2022  END OF SESSION:  PT End of Session - 09/07/22 1407     Visit Number 7    Number of Visits 13    Date for PT Re-Evaluation 09/18/22    Authorization Type Medicare    PT Start Time 1406   patient late   PT Stop Time [id number]    PT Time Calculation (min) 41 min    Equipment Utilized During Treatment Gait belt    Activity Tolerance Treatment limited secondary to medical complications (Comment)   lightheaded/ feeling weak   Behavior During Therapy WFL for tasks assessed/performed             Past Medical History:  Diagnosis Date   Allergy    Anemia    CAD in native artery 09/29/2016   Coronary angiogram 09/29/16: Dynamic LV systolic function, EF 123456. Left dominant circulation. Mild disease in the ramus and LAD. Circumflex midsegment  90% by IVUS S/P stenting 4.0 x 18 mm Onyx DES.   DDD (degenerative disc disease), cervical    DDD (degenerative disc disease), lumbar    Depression    Heart murmur    History of pleurisy    HLD (hyperlipidemia)    Hodgkin's disease 1997   -no chemo per pt ==  Chest RT and splenectomy   Hypertension    pt denies- when walks BP elevates - no dx of HTN per pt   Hypothyroidism    no longer per pt    Insomnia    Internal and external bleeding hemorrhoids    Migraines    MVP (mitral valve prolapse)    Osteopenia    Personal history of colonic adenoma 06/29/2011   06/29/2011 - diminutive adenoma   Seasonal allergies    Shingles 2018   Vitamin D deficiency    Past Surgical History:  Procedure Laterality Date   BUNIONECTOMY     CARDIAC CATHETERIZATION  09/29/2016   COLONOSCOPY  09/02/2007   redundant colon, external hemorrhoids   COLONOSCOPY  06/29/2011   diminutive polyp, diverticulosis, external hemorrhoids   CORONARY STENT INTERVENTION N/A 09/29/2016   Procedure: Coronary Stent  Intervention;  Surgeon: Adrian Prows, MD;  Location: [place] [facility];  Service: Cardiovascular;  Laterality: N/A;   DEEP NECK LYMPH NODE BIOPSY / EXCISION  1996   right LN    ESOPHAGOGASTRODUODENOSCOPY  06/29/2011   notrmal, 54 Fr dilation (dysphagia)   INTRAVASCULAR ULTRASOUND/IVUS N/A 09/29/2016   Procedure: Intravascular Ultrasound/IVUS;  Surgeon: Adrian Prows, MD;  Location: [place] [facility];  Service: Cardiovascular;  Laterality: N/A;   LEFT HEART CATH AND CORONARY ANGIOGRAPHY N/A 09/29/2016   Procedure: Left Heart Cath and Coronary Angiography;  Surgeon: Adrian Prows, MD;  Location: [place] [facility];  Service: Cardiovascular;  Laterality: N/A;   POLYPECTOMY     SPLENECTOMY, TOTAL  1996   STRABISMUS SURGERY  02/2009   left   TONSILLECTOMY     age [age]   Patient Active Problem List   Diagnosis Date Noted   Asymptomatic stenosis of left carotid artery 02/27/2021   Anxiety and depression 02/14/2019   Lumbosacral radiculopathy at S1 01/12/2018   Coronary artery disease of native artery of native heart with stable angina pectoris ([place]) 11/02/2016   Hyperlipidemia 11/02/2016   Unstable angina pectoris ([place]) 09/29/2016   Esophageal dysmotility 07/27/2014   Personal history of colonic adenoma 06/29/2011  Lymphoma in remission ([place]) 06/14/2011    Class: Chronic   S/P splenectomy 06/14/2011    Class: Chronic   Mitral valve prolapse 06/14/2011    Class: Chronic   Low back pain 06/14/2011    Class: Chronic   Fibromyalgia 06/14/2011    Class: Chronic   IBS (irritable bowel syndrome) 06/10/2011    PCP: Donnajean Lopes, MD  REFERRING PROVIDER: Raylene Miyamoto, MD   REFERRING DIAG: R42 (ICD-10-CM) - Dizziness   THERAPY DIAG:  Unsteadiness on feet  Dizziness and giddiness  ONSET DATE: 06/09/2022 (date of referral)  Rationale for Evaluation and Treatment: Rehabilitation  SUBJECTIVE:   SUBJECTIVE STATEMENT: Patient reports doing fair- is dizzy today and had to hold onto a  pillar for support while entering the clinic. Patient continues to report that her legs are feeling weaker, especially with dizziness. Denies falls/near falls.   BP seated: 132/65 HR: 90 BP standing: 100/56 HR: 93  Pt accompanied by: self  PERTINENT HISTORY: PMH: hyperlipidemia, DDD, depression, HTN, osteopenia, migraines, coronary artery disease status post PCI to the dominant circumflex in 2018, LBBB, history of Hodgkin's lymphoma 1997 status post chemotherapy and radiation therapy to chest   Per Dr. Benjamine Mola ENT note from 06/09/22: Pt noted to have asymmetric L ear sensorineural hearing loss, central vestibular dysfunction    PAIN:  Are you having pain? No  PRECAUTIONS: Fall and Other: monitor BP  PATIENT GOALS: To get some exercises to practice at home.   OBJECTIVE:   DIAGNOSTIC FINDINGS:   MRI brain 06/04/22:  IMPRESSION: Unremarkable MRI appearance of the brain for age. No evidence of acute intracranial abnormality.   No cerebellopontine angle or internal auditory canal mass.   No specific cause of hearing loss is identified.   VESTIBULAR TREATMENT:  Patient with orthostatic drop, but opting to continue with PT  Seated ankle AROM on wobble board for improved coordination and endurance of muscles   *attempted to ambulate to corner to complete balance exercises, however, ~17ft in, patient needing to stop at stairs and hold on to regain balance  -reported feeling like she was going to "black out," but denied dizziness (?) more just her legs feelings weak.   -patient ambulating 230' with PT monitoring HR and O2.   -O2 fluctuating between 94-96% with HR consistently fluctuating between 88BPM at rest and 116BPM with immediate onset of activity   *extensive conversation regarding etiology of dizziness/imbalance as it pertains to PT scope of care   PATIENT EDUCATION: Education details: see above Person educated: Patient Education method: Explanation, Demonstration, and  Verbal cues Education comprehension: verbalized understanding and returned demonstration  HOME EXERCISE PROGRAM: Access Code: [email or web address] URL: [email or web address] Date: 08/31/2022 Prepared by: Janann August  Exercises - Romberg Stance Eyes Closed on Foam Pad  - 1 x daily - 5 x weekly - 3 sets - 30 hold - Romberg Stance on Foam Pad  - 1 x daily - 5 x weekly - 2 sets - 10 reps - Semi-Tandem Corner Balance: Eyes Open With Head Turns  - 1 x daily - 5 x weekly - 2 sets - 10 hold - Narrow Stance with Eyes Closed and Head Nods on Foam Pad  - 1 x daily - 5 x weekly - 2 sets - 10 reps - Walking with Head Nod  - 2 x daily - 5 x weekly - 3 sets - Walking with Head Rotation  - 2 x daily - 5 x weekly - 3 sets  GOALS: Goals reviewed  with patient? Yes  SHORT TERM GOALS: ALL STGS = LTGS  LONG TERM GOALS: Target date: 08/19/2022  Pt will be independent with final HEP for balance in order to build upon functional gains made in therapy. Baseline: intermittently compliant  Goal status: IN PROGRESS  2.  FGA goal not indicated as she scored a 25/30  Baseline: not indicated Goal status: Discontinued  3.  Patient will score at or above age- matched norms for the vestibular system sensory analysis to demonstrate improved function Baseline: below age-matched norms Goal status: REVISED  4.  Pt will improve DFS to 56 in order to demo improved functional outcomes.  Baseline: 50, 55 Goal status: IN PROGRESS  NEW LONG TERM GOALS: Target date: 09/18/2022  Pt will be independent with final HEP for balance in order to build upon functional gains made in therapy. Baseline: intermittently compliant  Goal status: IN PROGRESS   2.  Patient will score at or above age- matched norms for the vestibular system sensory analysis to demonstrate improved function Baseline: below age-matched norms Goal status: REVISED  3.  Pt will improve DFS to 56 in order to demo improved functional outcomes.   Baseline: 50, 55 Goal status: IN PROGRESS  ASSESSMENT:  CLINICAL IMPRESSION: Patient seen for skilled PT session with emphasis on attempting balance retraining. Patient presenting to clinic today with orthostatic hypotension and being symptomatic of such. Per patient, cardiologist suggesting that her relative inactivity has resulted in poor exercise tolerance. Per patient, she walks a lengthy distance ~3x a week and participates in a pilates class ~2x a week. Her BP and fluctuating symptoms do seem to vary greatly and impact her daily. Continue POC as able.    OBJECTIVE IMPAIRMENTS: cardiopulmonary status limiting activity, decreased activity tolerance, decreased balance, and dizziness.   ACTIVITY LIMITATIONS: bending, stairs, transfers, and locomotion level  PARTICIPATION LIMITATIONS: community activity and exercise  PERSONAL FACTORS: Past/current experiences, Time since onset of injury/illness/exacerbation, and 3+ comorbidities: hyperlipidemia, DDD, depression, HTN, osteopenia, migraines, coronary artery disease status post PCI to the dominant circumflex in 2018, LBBB, history of Hodgkin's lymphoma 1997 status post chemotherapy and radiation therapy to chest, asymmetric L ear sensorineural hearing loss  are also affecting patient's functional outcome.   REHAB POTENTIAL: Good  CLINICAL DECISION MAKING: Stable/uncomplicated  EVALUATION COMPLEXITY: Low   PLAN:  PT FREQUENCY: 2x/week 1x a week (re-cert)  PT DURATION: 4 weeks 4 weeks (re-cert)   PLANNED INTERVENTIONS: Therapeutic exercises, Therapeutic activity, Neuromuscular re-education, Balance training, Gait training, Patient/Family education, Self Care, Vestibular training, Canalith repositioning, and Re-evaluation  PLAN FOR NEXT SESSION: check goals and dc  Debbora Dus, PT, DPT, CBIS 09/07/22 3:05 PM

## 2022-09-14 ENCOUNTER — Ambulatory Visit: Payer: Medicare Other

## 2022-09-14 DIAGNOSIS — R2681 Unsteadiness on feet: Secondary | ICD-10-CM | POA: Diagnosis not present

## 2022-09-14 DIAGNOSIS — R42 Dizziness and giddiness: Secondary | ICD-10-CM

## 2022-09-14 NOTE — Therapy (Signed)
OUTPATIENT PHYSICAL THERAPY VESTIBULAR TREATMENT     Patient Name: Cynthia Poole MRN: [id number] DOB:12-30-1955, 67 y.o., female Today's Date: 09/14/2022   PHYSICAL THERAPY DISCHARGE SUMMARY  Visits from Start of Care: 8  Current functional level related to goals / functional outcomes: See below   Remaining deficits: Remains with dizziness not of vestibular origin determined through vestibular testing   Education / Equipment: PT POC, HEP, etiology    Patient agrees to discharge. Patient goals were partially met. Patient is being discharged due to maximized rehab potential.   END OF SESSION:  PT End of Session - 09/14/22 1406     Visit Number 8    Number of Visits 13    Date for PT Re-Evaluation 09/18/22    Authorization Type Medicare    PT Start Time 1405   Patient late   PT Stop Time [id number]   discharge   PT Time Calculation (min) 32 min    Activity Tolerance Patient tolerated treatment well    Behavior During Therapy [facility] for tasks assessed/performed             Past Medical History:  Diagnosis Date   Allergy    Anemia    CAD in native artery 09/29/2016   Coronary angiogram 09/29/16: Dynamic LV systolic function, EF 123456. Left dominant circulation. Mild disease in the ramus and LAD. Circumflex midsegment  90% by IVUS S/P stenting 4.0 x 18 mm Onyx DES.   DDD (degenerative disc disease), cervical    DDD (degenerative disc disease), lumbar    Depression    Heart murmur    History of pleurisy    HLD (hyperlipidemia)    Hodgkin's disease 1997   -no chemo per pt ==  Chest RT and splenectomy   Hypertension    pt denies- when walks BP elevates - no dx of HTN per pt   Hypothyroidism    no longer per pt    Insomnia    Internal and external bleeding hemorrhoids    Migraines    MVP (mitral valve prolapse)    Osteopenia    Personal history of colonic adenoma 06/29/2011   06/29/2011 - diminutive adenoma   Seasonal allergies    Shingles 2018   Vitamin D deficiency     Past Surgical History:  Procedure Laterality Date   BUNIONECTOMY     CARDIAC CATHETERIZATION  09/29/2016   COLONOSCOPY  09/02/2007   redundant colon, external hemorrhoids   COLONOSCOPY  06/29/2011   diminutive polyp, diverticulosis, external hemorrhoids   CORONARY STENT INTERVENTION N/A 09/29/2016   Procedure: Coronary Stent Intervention;  Surgeon: Adrian Prows, MD;  Location: [place] [facility];  Service: Cardiovascular;  Laterality: N/A;   DEEP NECK LYMPH NODE BIOPSY / EXCISION  1996   right LN    ESOPHAGOGASTRODUODENOSCOPY  06/29/2011   notrmal, 54 Fr dilation (dysphagia)   INTRAVASCULAR ULTRASOUND/IVUS N/A 09/29/2016   Procedure: Intravascular Ultrasound/IVUS;  Surgeon: Adrian Prows, MD;  Location: [place] [facility];  Service: Cardiovascular;  Laterality: N/A;   LEFT HEART CATH AND CORONARY ANGIOGRAPHY N/A 09/29/2016   Procedure: Left Heart Cath and Coronary Angiography;  Surgeon: Adrian Prows, MD;  Location: [place] [facility];  Service: Cardiovascular;  Laterality: N/A;   POLYPECTOMY     SPLENECTOMY, TOTAL  1996   STRABISMUS SURGERY  02/2009   left   TONSILLECTOMY     age [age]   Patient Active Problem List   Diagnosis Date Noted   Asymptomatic stenosis of left  carotid artery 02/27/2021   Anxiety and depression 02/14/2019   Lumbosacral radiculopathy at S1 01/12/2018   Coronary artery disease of native artery of native heart with stable angina pectoris ([place]) 11/02/2016   Hyperlipidemia 11/02/2016   Unstable angina pectoris ([place]) 09/29/2016   Esophageal dysmotility 07/27/2014   Personal history of colonic adenoma 06/29/2011   Lymphoma in remission ([place]) 06/14/2011    Class: Chronic   S/P splenectomy 06/14/2011    Class: Chronic   Mitral valve prolapse 06/14/2011    Class: Chronic   Low back pain 06/14/2011    Class: Chronic   Fibromyalgia 06/14/2011    Class: Chronic   IBS (irritable bowel syndrome) 06/10/2011    PCP: Donnajean Lopes, MD  REFERRING PROVIDER: Raylene Miyamoto, MD   REFERRING DIAG: R42 (ICD-10-CM) - Dizziness   THERAPY DIAG:  Unsteadiness on feet  Dizziness and giddiness  ONSET DATE: 06/09/2022 (date of referral)  Rationale for Evaluation and Treatment: Rehabilitation  SUBJECTIVE:   SUBJECTIVE STATEMENT: Patient reports doing fair-does continue to report persistent dizziness. Denies falls/near falls. Is doing the HEP. Reporting that dizziness seems to not have been impacted by therapy.   BP seated: 113/70 HR: 87 BP standing: 99/68 HR: 91  Pt accompanied by: self  PERTINENT HISTORY: PMH: hyperlipidemia, DDD, depression, HTN, osteopenia, migraines, coronary artery disease status post PCI to the dominant circumflex in 2018, LBBB, history of Hodgkin's lymphoma 1997 status post chemotherapy and radiation therapy to chest   Per Dr. Benjamine Mola ENT note from 06/09/22: Pt noted to have asymmetric L ear sensorineural hearing loss, central vestibular dysfunction    PAIN:  Are you having pain? No  PRECAUTIONS: Fall and Other: monitor BP  PATIENT GOALS: To get some exercises to practice at home.   OBJECTIVE:   DIAGNOSTIC FINDINGS:   MRI brain 06/04/22:  IMPRESSION: Unremarkable MRI appearance of the brain for age. No evidence of acute intracranial abnormality.   No cerebellopontine angle or internal auditory canal mass.   No specific cause of hearing loss is identified.   VESTIBULAR TREATMENT: Goal assessment: -SOT Condition 1: pass 3/3 Condition 2: pass 2/3 Condition 3: pass 3/3 Condition 4: pass 2/3 Condition 5: pass 3/3 Condition 6: pass 3/3  Composite: 69  Above age-matched norms for all    PATIENT EDUCATION: Education details: PT POC, exam findings, dizziness not vestibular in origin Person educated: Patient Education method: Explanation, Demonstration, and Verbal cues Education comprehension: verbalized understanding and returned demonstration  HOME EXERCISE PROGRAM: Access Code: [email or web address] URL:  [email or web address] Date: 08/31/2022 Prepared by: Janann August  Exercises - Romberg Stance Eyes Closed on Foam Pad  - 1 x daily - 5 x weekly - 3 sets - 30 hold - Romberg Stance on Foam Pad  - 1 x daily - 5 x weekly - 2 sets - 10 reps - Semi-Tandem Corner Balance: Eyes Open With Head Turns  - 1 x daily - 5 x weekly - 2 sets - 10 hold - Narrow Stance with Eyes Closed and Head Nods on Foam Pad  - 1 x daily - 5 x weekly - 2 sets - 10 reps - Walking with Head Nod  - 2 x daily - 5 x weekly - 3 sets - Walking with Head Rotation  - 2 x daily - 5 x weekly - 3 sets  GOALS: Goals reviewed with patient? Yes  SHORT TERM GOALS: ALL STGS = LTGS  LONG TERM GOALS: Target date: 08/19/2022  Pt will be independent with final  HEP for balance in order to build upon functional gains made in therapy. Baseline: intermittently compliant  Goal status: IN PROGRESS  2.  FGA goal not indicated as she scored a 25/30  Baseline: not indicated Goal status: Discontinued  3.  Patient will score at or above age- matched norms for the vestibular system sensory analysis to demonstrate improved function Baseline: below age-matched norms Goal status: REVISED  4.  Pt will improve DFS to 56 in order to demo improved functional outcomes.  Baseline: 50, 55 Goal status: IN PROGRESS  NEW LONG TERM GOALS: Target date: 09/18/2022  Pt will be independent with final HEP for balance in order to build upon functional gains made in therapy. Baseline: intermittently compliant; provided Goal status: MET   2.  Patient will score at or above age- matched norms for the vestibular system sensory analysis to demonstrate improved function Baseline: below age-matched norms; above age matched norms Goal status: MET  3.  Pt will improve DFS to 56 in order to demo improved functional outcomes.  Baseline: 50, 55 Goal status: NOT MET  ASSESSMENT:  CLINICAL IMPRESSION: Patient seen for skilled PT session with  emphasis on goal assessment and dc. She remains with dizziness primarily with standing for too long and with exertion. Patient mildly orthostatic today with lower BP to begin with at onset of session. Through SOT testing, patient above age-matched norms for vestibular function and overall balance strategies. Her balance has improved, while her dizziness has not necessarily. She would benefit from further cardiac/medical work up to determine etiology of dizziness.    OBJECTIVE IMPAIRMENTS: cardiopulmonary status limiting activity, decreased activity tolerance, decreased balance, and dizziness.   ACTIVITY LIMITATIONS: bending, stairs, transfers, and locomotion level  PARTICIPATION LIMITATIONS: community activity and exercise  PERSONAL FACTORS: Past/current experiences, Time since onset of injury/illness/exacerbation, and 3+ comorbidities: hyperlipidemia, DDD, depression, HTN, osteopenia, migraines, coronary artery disease status post PCI to the dominant circumflex in 2018, LBBB, history of Hodgkin's lymphoma 1997 status post chemotherapy and radiation therapy to chest, asymmetric L ear sensorineural hearing loss  are also affecting patient's functional outcome.   REHAB POTENTIAL: Good  CLINICAL DECISION MAKING: Stable/uncomplicated  EVALUATION COMPLEXITY: Low   PLAN:  PT FREQUENCY: 2x/week 1x a week (re-cert)  PT DURATION: 4 weeks 4 weeks (re-cert)   PLANNED INTERVENTIONS: Therapeutic exercises, Therapeutic activity, Neuromuscular re-education, Balance training, Gait training, Patient/Family education, Self Care, Vestibular training, Canalith repositioning, and Re-evaluation  PLAN FOR NEXT SESSION: DC from PT  Debbora Dus, PT, DPT, CBIS 09/14/22 2:40 PM

## 2022-09-16 ENCOUNTER — Ambulatory Visit: Payer: Medicare Other

## 2022-09-16 ENCOUNTER — Encounter: Payer: Self-pay | Admitting: Cardiology

## 2022-09-16 ENCOUNTER — Ambulatory Visit: Payer: Medicare Other | Admitting: Cardiology

## 2022-09-16 VITALS — BP 133/71 | HR 86 | Ht 66.0 in | Wt 171.0 lb

## 2022-09-16 DIAGNOSIS — I952 Hypotension due to drugs: Secondary | ICD-10-CM

## 2022-09-16 DIAGNOSIS — I25118 Atherosclerotic heart disease of native coronary artery with other forms of angina pectoris: Secondary | ICD-10-CM

## 2022-09-16 DIAGNOSIS — I10 Essential (primary) hypertension: Secondary | ICD-10-CM

## 2022-09-16 DIAGNOSIS — I6522 Occlusion and stenosis of left carotid artery: Secondary | ICD-10-CM

## 2022-09-16 MED ORDER — MIDODRINE HCL 5 MG PO TABS: 5.0000 mg | ORAL_TABLET | ORAL | 2 refills | Status: DC

## 2022-09-16 NOTE — Progress Notes (Signed)
Primary Physician/Referring:  Donnajean Lopes, MD  Patient ID: Cynthia Poole, female    DOB: 1955/11/04, 67 y.o.   MRN: [id number]  Chief Complaint  Patient presents with   Coronary Artery Disease   Dizziness        HPI:    Cynthia Poole  is a 67 y.o. Caucasian female with hyperlipidemia, coronary artery disease status post PCI to the dominant circumflex in 2018, LBBB, history of Hodgkin's lymphoma 1997 status post chemotherapy and radiation therapy to chest seen on urgent basis due to severe dizziness and marked orthostasis noted during carotid duplex examination.  She continues to have  frequent dizziness when she walks up a flight of stairs, essentially no significant symptoms when she walks on a flat ground.  States that as she walked up over steps in our office which is about 7-8 steps, she started feeling dizzy.  She is feeling better now.  No chest pain, syncope.    Patient states that her activity level has decreased remarkably since her symptoms of gotten worse over the past 6 to 8 months.  She has had mild exercise-induced dizziness 3 years ago and had an event monitor which did not reveal any significant arrhythmias.  In view of her persistent symptoms, she was evaluated at [facility] clinic for second opinion, underwent cardiac catheterization which revealed patent stent   Past Medical History:  Diagnosis Date   Allergy    Anemia    CAD in native artery 09/29/2016   Coronary angiogram 09/29/16: Dynamic LV systolic function, EF 123456. Left dominant circulation. Mild disease in the ramus and LAD. Circumflex midsegment  90% by IVUS S/P stenting 4.0 x 18 mm Onyx DES.   DDD (degenerative disc disease), cervical    DDD (degenerative disc disease), lumbar    Depression    Heart murmur    History of pleurisy    HLD (hyperlipidemia)    Hodgkin's disease 1997   -no chemo per pt ==  Chest RT and splenectomy   Hypertension    pt denies- when walks BP elevates - no dx of HTN per pt    Hypothyroidism    no longer per pt    Insomnia    Internal and external bleeding hemorrhoids    Migraines    MVP (mitral valve prolapse)    Osteopenia    Personal history of colonic adenoma 06/29/2011   06/29/2011 - diminutive adenoma   Seasonal allergies    Shingles 2018   Vitamin D deficiency    Social History   Tobacco Use   Smoking status: Never   Smokeless tobacco: Never  Substance Use Topics   Alcohol use: Yes    Alcohol/week: 7.0 standard drinks of alcohol    Types: 7 Glasses of wine per week    Comment: occasionally  Marital Status: Divorced   ROS  Review of Systems  Cardiovascular:  Negative for chest pain, dyspnea on exertion and leg swelling.  Neurological:  Positive for dizziness (with walking the stairs).   Objective  Blood pressure 133/71, pulse 86, height 5\' 6"  (1.676 m), weight 171 lb (77.6 kg), SpO2 95 %.     09/16/2022   10:36 AM 08/31/2022    4:10 PM 08/31/2022    3:47 PM  Vitals with BMI  Height 5\' 6"     Weight 171 lbs    BMI 0000000    Systolic Q000111Q 123456 93  Diastolic 71 62 56  Pulse 86 98 96  Orthostatic VS for the past 72 hrs (Last 3 readings):  Orthostatic BP Patient Position BP Location Cuff Size  09/16/22 1055 (!) 88/60 Standing Left Arm --  09/16/22 1054 (!) 74/47 Standing Left Arm --  09/16/22 1053 108/72 Sitting Left Arm --  09/16/22 1052 101/68 Supine Left Arm --  09/16/22 1036 -- Sitting Left Arm Small    No data found.   Physical Exam Neck:     Vascular: No carotid bruit or JVD.  Cardiovascular:     Rate and Rhythm: Normal rate and regular rhythm.     Pulses: Normal pulses and intact distal pulses.          Carotid pulses are  on the right side with bruit and  on the left side with bruit.    Heart sounds: No midsystolic click. Murmur heard.     Mid to late systolic murmur is present with a grade of 2/6 at the upper right sternal border and apex.     No gallop.  Pulmonary:     Effort: Pulmonary effort is normal.     Breath  sounds: Normal breath sounds.  Abdominal:     General: Bowel sounds are normal.     Palpations: Abdomen is soft.  Musculoskeletal:     Right lower leg: No edema.     Left lower leg: No edema.  Skin:    General: Skin is warm.    Laboratory examination:      Latest Ref Rng & Units 01/14/2021    9:46 AM 09/30/2016    2:47 AM 09/29/2016   10:44 AM  CMP  Glucose 65 - 99 mg/dL 92  109  168   BUN 8 - 27 mg/dL 12  11  9    Creatinine 0.57 - 1.00 mg/dL 0.97  0.84  0.85   Sodium 134 - 144 mmol/L 134  136  135   Potassium 3.5 - 5.2 mmol/L 4.7  5.4  3.9   Chloride 96 - 106 mmol/L 98  103  101   CO2 20 - 29 mmol/L 25  26  24    Calcium 8.7 - 10.3 mg/dL 9.2  9.5  9.2   Total Protein 6.0 - 8.5 g/dL 6.8     Total Bilirubin 0.0 - 1.2 mg/dL 0.3     Alkaline Phos 44 - 121 IU/L 81     AST 0 - 40 IU/L 22     ALT 0 - 32 IU/L 14         Latest Ref Rng & Units 09/30/2016    2:47 AM 09/29/2016   10:44 AM 06/15/2011    4:48 AM  CBC  WBC 4.0 - 10.5 K/uL 8.2  6.1  5.4   Hemoglobin 12.0 - 15.0 g/dL 11.4  12.3  10.7   Hematocrit 36.0 - 46.0 % 33.8  36.7  31.8   Platelets 150 - 400 K/uL 284  287  266    Lipid Panel Recent Labs    01/20/22 0831  CHOL 193  TRIG 117  LDLCALC 103*  HDL 70   HEMOGLOBIN A1C Lab Results  Component Value Date   HGBA1C 5.5 09/30/2016   MPG 111 09/30/2016   Lab Results  Component Value Date   TSH 3.670 01/14/2021    Labs 03/24/2019:   Labs 07/01/2022:  Hb 12.0/HCT 36.5, platelets 320.  Serum bilirubin 0.2, AST mildly elevated at 50 and ALT mildly elevated at 43.  BUN 25, creatinine 1.06, potassium 5.2, EGFR 58 mL.  Total cholesterol 191, triglycerides 102, HDL 61, LDL 110, non-HDL cholesterol 130.  LPA <30.  02/19/2021:  Vitamin D 28.7.  Free T4 normal.  TSH normal.  Radiology:   Aortic atherosclerosis (I70.0)   CT of the chest without contrast 11/10/2016 [facility]):  Mild to moderate coronary artery calcification, normal thoracic aorta, mild  calcification of the aortic valve and aortic root. Fibrotic changes in both upper lobes likely related to prior radiation therapy.   VQ Scan at [facility] 11/10/2016: Negative for PE.  Cardiac Studies:   Coronary angiogram 09/29/16: Dynamic LV systolic function, EF 123456. Left dominant circulation. Mild disease in the ramus and LAD. Circumflex midsegment 90% stenosis by intravascular ultrasound. Successful stenting with 4.0 x 18 mm Onyx DES.   PCV ECHOCARDIOGRAM COMPLETE 02/11/2021  Narrative Echocardiogram 02/11/2021: Left ventricle cavity is normal in size. Mild concentric hypertrophy of the left ventricle. Normal global wall motion. Normal LV systolic function with EF 55%. Doppler evidence of grade I (impaired) diastolic dysfunction, normal LAP. Aneurysmal interatrial septum without 2D or color Doppler evidence of shunting. Trileaflet aortic valve.  Moderate (Grade II) aortic regurgitation. Moderate (Grade II) mitral regurgitation. Mild to moderate tricuspid regurgitation. Mild pulmonic regurgitation. No evidence of pulmonary hypertension. Study on 07/21/2019 showed mild AI. mild TR.  PCV CARDIAC STRESS TEST 04/03/2022  Narrative Exercise treadmill stress test 04/03/2022: Exercise treadmill stress test performed using Bruce protocol.  Patient reached 7 METS, and 88% of age predicted maximum heart rate.  Exercise capacity was low normal.  No chest pain reported.  Normal heart rate response. Resting hypertension 150/80 mmHg, with hypertensive exercise response-peak BP 240/90 mmHg. Stress EKG uninterpretable for ischemia due to underlying LBBB. Consider alternate testing if ischemia evaluation indicated.  PCV MYOCARDIAL PERFUSION WITH LEXISCAN 05/11/2022  Narrative Lexiscan (with Mod Bruce protocol) Nuclear stress test 05/11/2022: Myocardial perfusion is abnormal. There is a reversible severe defect in the basal to mid anterior and septal regions. Overall LV systolic function is  normal without regional wall motion abnormalities. Stress LV EF: 66%. Nondiagnostic ECG stress. The heart rate response was consistent with Regadenoson. In the absence of wall motion abnormality and preserved LVEF, the defect may be due to LBBB. Compared to 07/17/2019, the defect severity appears to be much more prominent in the present study. Intermediate risk study. Clinical correlation recommended.  Coronary angiogram on 07/02/2022:  Left main trunk normal. LAD, mild diffuse disease. Mild segment of myocardial bridging in the mid third. Circumflex: Dominant distribution. Mild disease in the proximal third. Coronary ectasia in the mid segment. Patent stent in the mid to distal third. RCA: Nondominant, mild diffuse disease. Angiographic findings show no significant progression of disease compared to 2018.   Carotid artery duplex 09/16/2022: Duplex suggests stenosis in the right internal carotid artery (minimal). Duplex suggests stenosis in the left internal carotid artery (16-49%). There is mild homogeneous plaque noted in bilateral carotid arteries.  Antegrade right vertebral artery flow. Antegrade left vertebral artery flow. No significant change from 09/02/2021. Follow up in one year is appropriate if clinically indicated.  EKG:    EKG 07/22/2022: Normal sinus rhythm at rate of 72 bpm, left atrial enlargement, left bundle branch block.  No further analysis.  Compared to 09/17/2021, no change.   Medications and allergies   Allergies  Allergen Reactions   Zofran Other (See Comments)    zofran causes drop in blood pressure   Citalopram Other (See Comments)   Doxycycline Other (See Comments)   Amoxicillin Rash  Codeine Nausea Only   Other Other (See Comments)    OPIATES & CODEINE CAUSE GI UPSET     Current Outpatient Medications:    ALPRAZolam (XANAX) 1 MG tablet, Take 1 tablet by mouth at bedtime. , Disp: , Rfl:    amitriptyline (ELAVIL) 25 MG tablet, Take 1 tablet by mouth  daily., Disp: , Rfl:    aspirin EC 81 MG EC tablet, Take 1 tablet (81 mg total) by mouth daily., Disp: , Rfl:    atorvastatin (LIPITOR) 20 MG tablet, TAKE 1 TABLET(20 MG) BY MOUTH DAILY, Disp: 30 tablet, Rfl: 0   calcium carbonate (OS-CAL) 600 MG TABS tablet, Take 600 mg by mouth daily., Disp: , Rfl:    Cholecalciferol (VITAMIN D) 2000 UNITS CAPS, Take 2,500 Units by mouth daily. , Disp: , Rfl:    FLUoxetine (PROZAC) 20 MG capsule, Take 40 mg by mouth every morning., Disp: , Rfl:    MAGNESIUM CITRATE PO, Take 300 mg by mouth daily., Disp: , Rfl:    midodrine (PROAMATINE) 5 MG tablet, Take 1 tablet (5 mg total) by mouth as directed. 3 times a day with food. Take while awake., Disp: 90 tablet, Rfl: 2   Omega-3 1400 MG CAPS, Take by mouth daily., Disp: , Rfl:    TURMERIC PO, Take 80 mg by mouth daily., Disp: , Rfl:    Assessment     ICD-10-CM   1. Hypotension due to drugs  I95.2 midodrine (PROAMATINE) 5 MG tablet    2. Primary hypertension  I10     3. Coronary artery disease of native artery of native heart with stable angina pectoris ([place])  I25.118     4. Asymptomatic stenosis of left carotid artery without infarction  I65.22      Meds ordered this encounter  Medications   midodrine (PROAMATINE) 5 MG tablet    Sig: Take 1 tablet (5 mg total) by mouth as directed. 3 times a day with food. Take while awake.    Dispense:  90 tablet    Refill:  2    Medications Discontinued During This Encounter  Medication Reason   amLODipine (NORVASC) 5 MG tablet Patient Preference   losartan (COZAAR) 50 MG tablet Patient Preference    Recommendations:   Cynthia Poole  is a 67 y.o. Caucasian female with hyperlipidemia, coronary artery disease status post PCI to the dominant circumflex in 2018, LBBB, history of Hodgkin's lymphoma 1997 status post chemotherapy and radiation therapy to chest seen on urgent basis due to severe dizziness and marked orthostasis noted during carotid duplex  examination.  She continues to have  frequent dizziness when she walks up a flight of stairs, essentially no significant symptoms when she walks on a flat ground.  States that as she walked up over steps in our office which is about 7-8 steps, she started feeling dizzy.  She is feeling better now.  No chest pain, syncope.   1. Hypotension due to drugs Patient's symptoms of dizziness now are more easily elicitable as patient is severely orthostatic.  I suspect combination of Fluoxetine along with amitriptyline and use of Xanax at night may be contributing to her orthostasis.  I would recommend that she discuss with her psychiatrist regarding changing her medications.  For now we will start her on ProAmatine 5 to 10 mg 3 times daily as needed, instructions regarding how to use it given to the patient and also advised her to use support stockings and keep yourself well-hydrated.  I  reviewed her carotid artery duplex, she had mild carotid stenosis previously, now velocities are within normal limits with very mild plaque.  - midodrine (PROAMATINE) 5 MG tablet; Take 1 tablet (5 mg total) by mouth as directed. 3 times a day with food. Take while awake.  Dispense: 90 tablet; Refill: 2  2. Primary hypertension Primary hypertension is now resolved in fact she has very low blood pressure.  Losartan and also amlodipine has been discontinued due to low blood pressure.  She remains stable from coronary disease standpoint.  3. Coronary artery disease of native artery of native heart with stable angina pectoris [facility]) She remains stable from cardiac standpoint without recurrence of angina pectoris.  I will see her back as previously scheduled in August 2024, she has been referred for tilt table testing, it may still be worthwhile to follow through with this at [facility].   4. Asymptomatic stenosis of left carotid artery without infarction  I reviewed the results of the carotid artery duplex, no significant  change, this is in spite of her lipids not being well-controlled.  Antegrade vertebral artery flow.  I will order repeat carotid duplex on her next office visit to be done in a year.   Adrian Prows, MD, [facility] 09/16/2022, 5:59 PM Office: [phone] Pager: [phone]

## 2022-10-19 ENCOUNTER — Ambulatory Visit: Payer: BLUE CROSS/BLUE SHIELD | Admitting: Cardiology

## 2022-12-23 ENCOUNTER — Other Ambulatory Visit: Payer: Self-pay | Admitting: Cardiology

## 2022-12-23 DIAGNOSIS — I952 Hypotension due to drugs: Secondary | ICD-10-CM

## 2023-01-22 ENCOUNTER — Ambulatory Visit: Payer: BLUE CROSS/BLUE SHIELD | Admitting: Cardiology

## 2023-04-01 ENCOUNTER — Telehealth: Payer: Self-pay | Admitting: Pharmacy Technician

## 2023-04-01 ENCOUNTER — Other Ambulatory Visit (HOSPITAL_COMMUNITY): Payer: Self-pay

## 2023-04-01 NOTE — Telephone Encounter (Addendum)
Pharmacy Patient Advocate Encounter   Received notification from CoverMyMeds that prior authorization for nexlizet is required/requested.   Insurance verification completed.   The patient is insured through [facility] .   Per test claim: PA required; PA submitted to [employer] via CoverMyMeds Key/confirmation #/EOC [id number] Status is pending

## 2023-04-05 ENCOUNTER — Other Ambulatory Visit (HOSPITAL_COMMUNITY): Payer: Self-pay

## 2023-04-06 ENCOUNTER — Other Ambulatory Visit (HOSPITAL_COMMUNITY): Payer: Self-pay

## 2023-04-09 ENCOUNTER — Other Ambulatory Visit (HOSPITAL_COMMUNITY): Payer: Self-pay

## 2023-04-09 NOTE — Telephone Encounter (Signed)
Pharmacy Patient Advocate Encounter  Received notification from [facility] that Prior Authorization for nexlizet has been APPROVED from 04/09/23 to 10/06/23. Ran test claim, Copay is $377.46- one month- DEDUCTIBLE. This test claim was processed through [facility]- copay amounts may vary at other pharmacies due to pharmacy/plan contracts, or as the patient moves through the different stages of their insurance plan.   PA #/Case ID/Reference #: [id number]

## 2023-12-19 IMAGING — MG MM DIGITAL DIAGNOSTIC UNILAT*R* W/ TOMO W/ CAD
4 series · 4 of 12 positions shown · non-contrast
Comparison: Previous exams including recent screening mammogram
dated 06/11/2021.

CLINICAL DATA: Patient returns today to evaluate a possible RIGHT
breast asymmetry questioned on recent screening mammogram.

EXAM:
DIGITAL DIAGNOSTIC UNILATERAL RIGHT MAMMOGRAM WITH TOMOSYNTHESIS AND
CAD; ULTRASOUND RIGHT BREAST LIMITED
TECHNIQUE: Right digital diagnostic mammography and breast tomosynthesis was
performed. The images were evaluated with computer-aided detection.;
Targeted ultrasound examination of the right breast was performed

[R ML synth-2D]
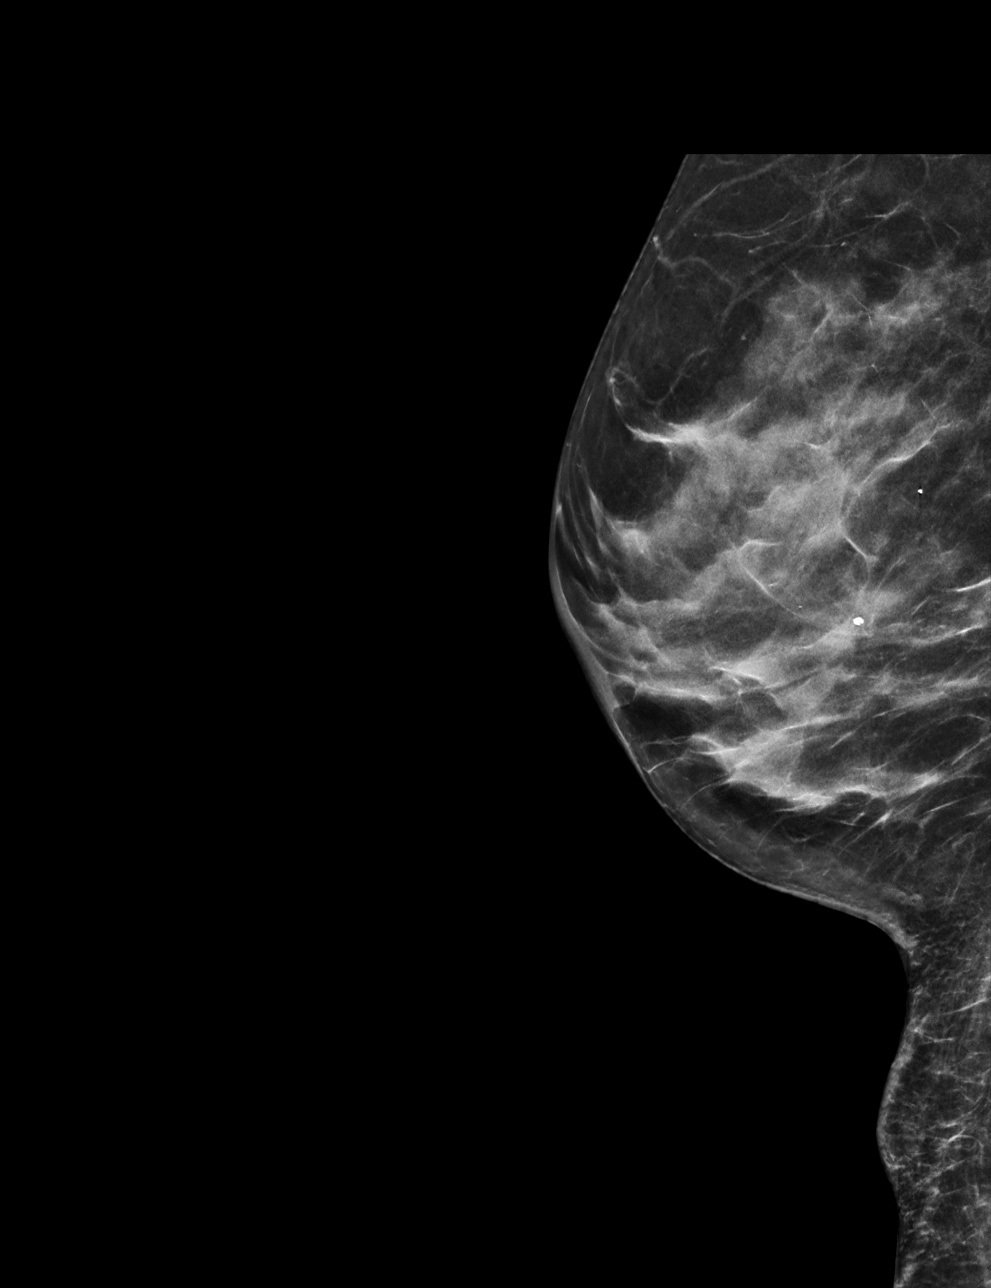

[R CC synth-2D]
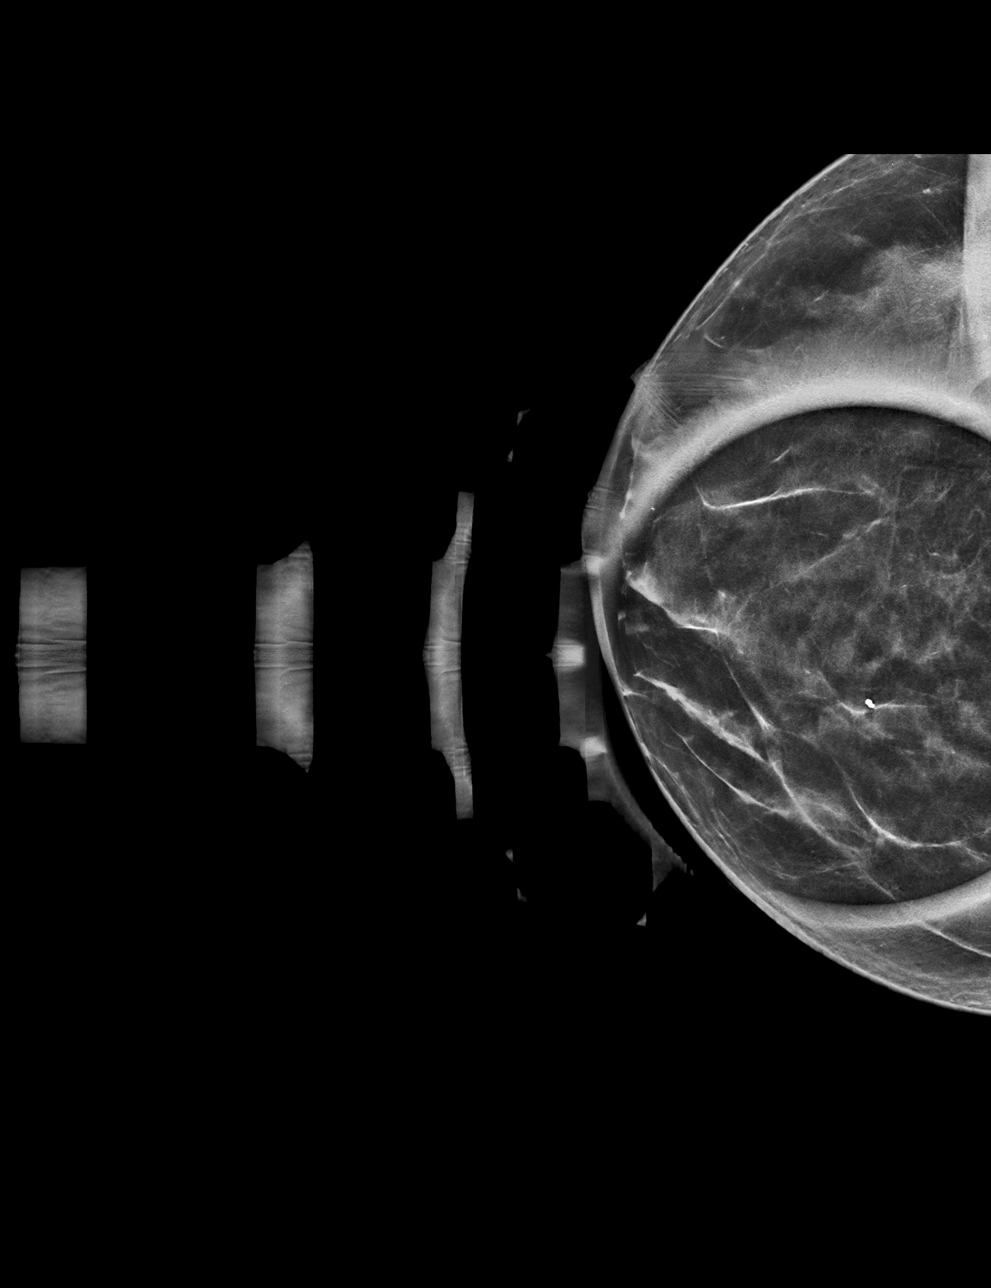

[R ML tomo · tomo slice 29/58.0]
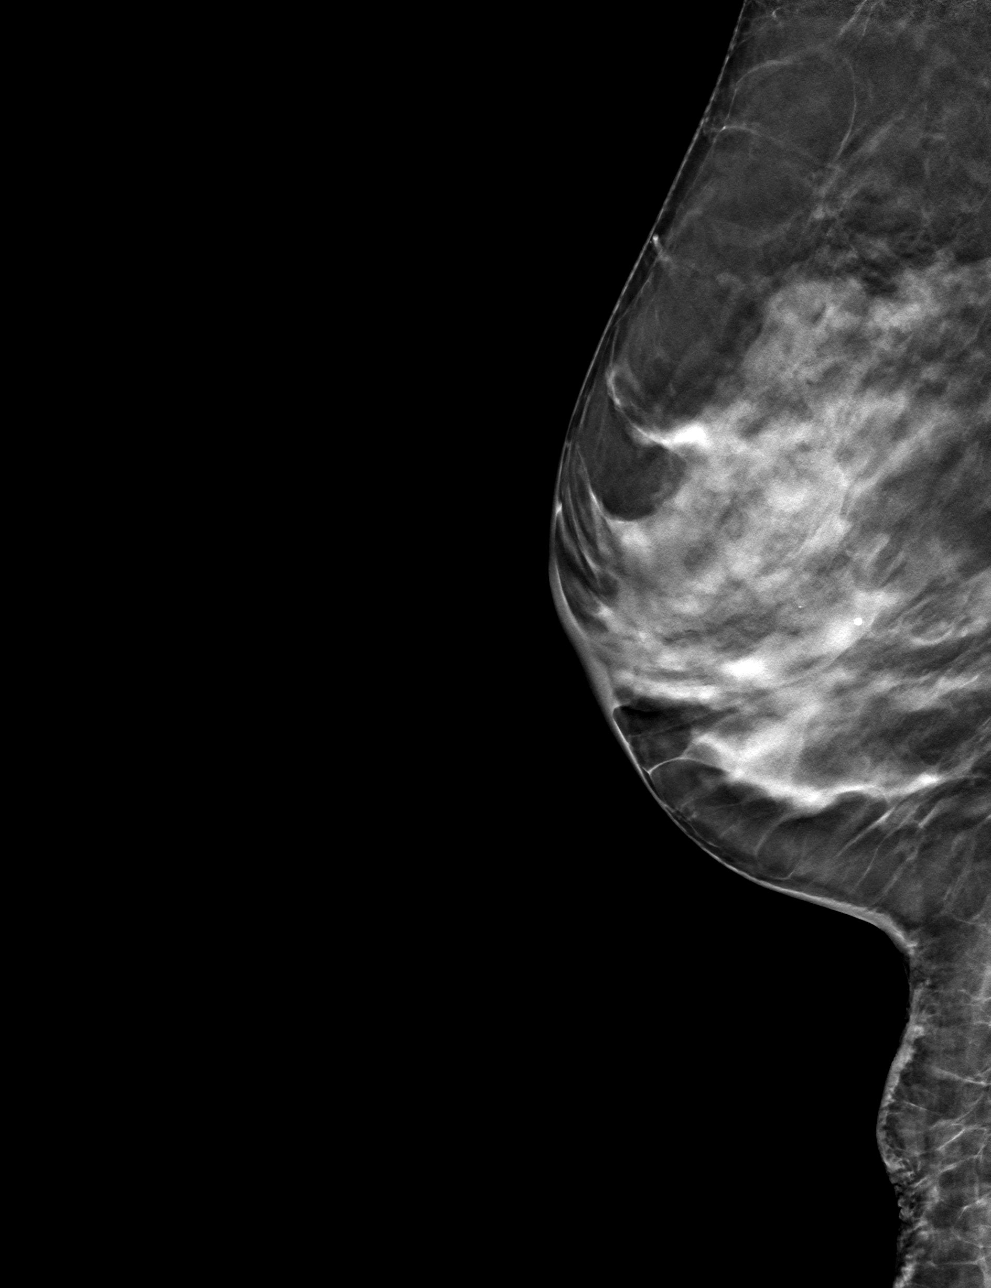

[R CC tomo · tomo slice 27/54.0]
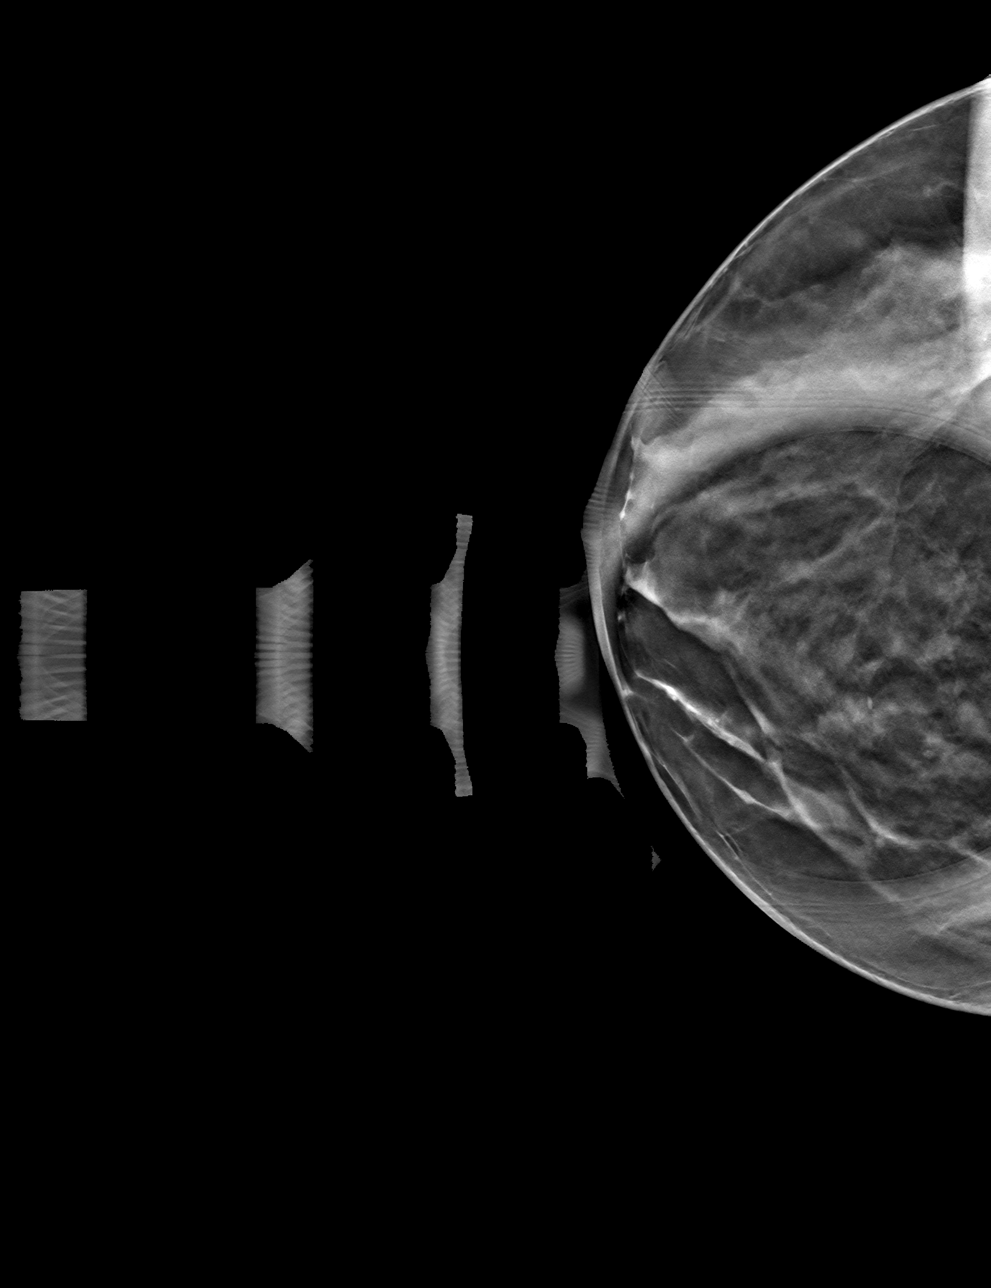

[4 of 12 positions shown; findings below may reference images not displayed]

ACR Breast Density Category d: The breast tissue is extremely dense,
which lowers the sensitivity of mammography.
FINDINGS: On today's additional diagnostic views, including spot compression
with 3D tomosynthesis, the questioned asymmetry within the inner
RIGHT breast is resolved suggesting superimposition of normal dense
fibroglandular tissues. Given the heterogeneously dense breast
tissues, ultrasound will be performed of this area to ensure
benignity.

Targeted ultrasound is performed, evaluating the inner RIGHT breast,
showing only normal fibroglandular tissues and fat lobules
throughout. No solid or cystic mass.
IMPRESSION: No evidence of malignancy within the RIGHT breast.

RECOMMENDATION:
1.  Screening mammogram in one year.(Code:1E-0-8VG)
2. At the time of today's exam, patient complained of a persistent
focal pain within the LEFT breast. An order for a LEFT breast
diagnostic mammogram and/or ultrasound could not be obtained today
from the ordering physician's office. Recommend clinical follow-up
and would consider a LEFT breast diagnostic mammogram and possible
ultrasound if the focal pain persisted.
3. Breast pain is a common condition, which will often resolve on
its own without intervention. Benign causes of breast pain were
discussed with the patient. Patient was encouraged to follow-up with
referring physician if the pain persisted or worsened as this might
indicate a need to evaluate the deeper structures of the underlying
chest wall. Patient was instructed to return for additional imaging
if a palpable lump developed.

I have discussed the findings and recommendations with the patient.
If applicable, a reminder letter will be sent to the patient
regarding the next appointment.

BI-RADS CATEGORY  1: Negative.

## 2023-12-19 IMAGING — US US BREAST*R* LIMITED INC AXILLA
1 series · 4 of 4 positions shown · non-contrast
Comparison: Previous exams including recent screening mammogram
dated 06/11/2021.

CLINICAL DATA: Patient returns today to evaluate a possible RIGHT
breast asymmetry questioned on recent screening mammogram.

EXAM:
DIGITAL DIAGNOSTIC UNILATERAL RIGHT MAMMOGRAM WITH TOMOSYNTHESIS AND
CAD; ULTRASOUND RIGHT BREAST LIMITED
TECHNIQUE: Right digital diagnostic mammography and breast tomosynthesis was
performed. The images were evaluated with computer-aided detection.;
Targeted ultrasound examination of the right breast was performed

[Series 1: us breast*right* limited inc axilla · 0.06mm/px · 4 of 4 slices shown]
[im 1/4]
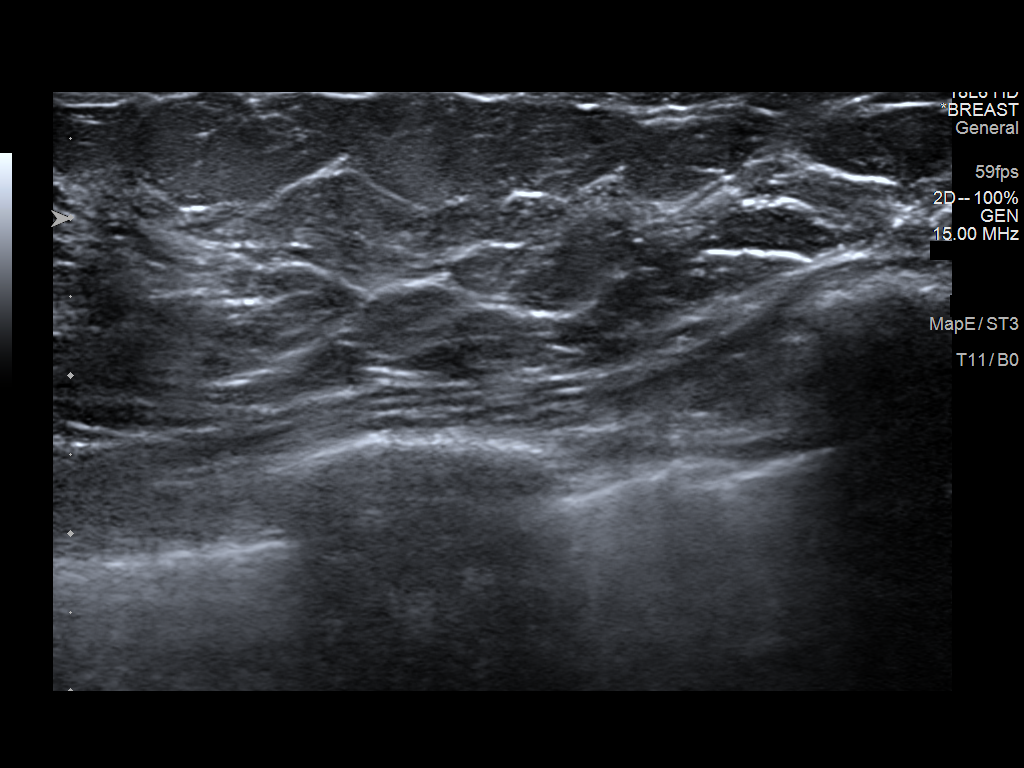
[im 2/4]
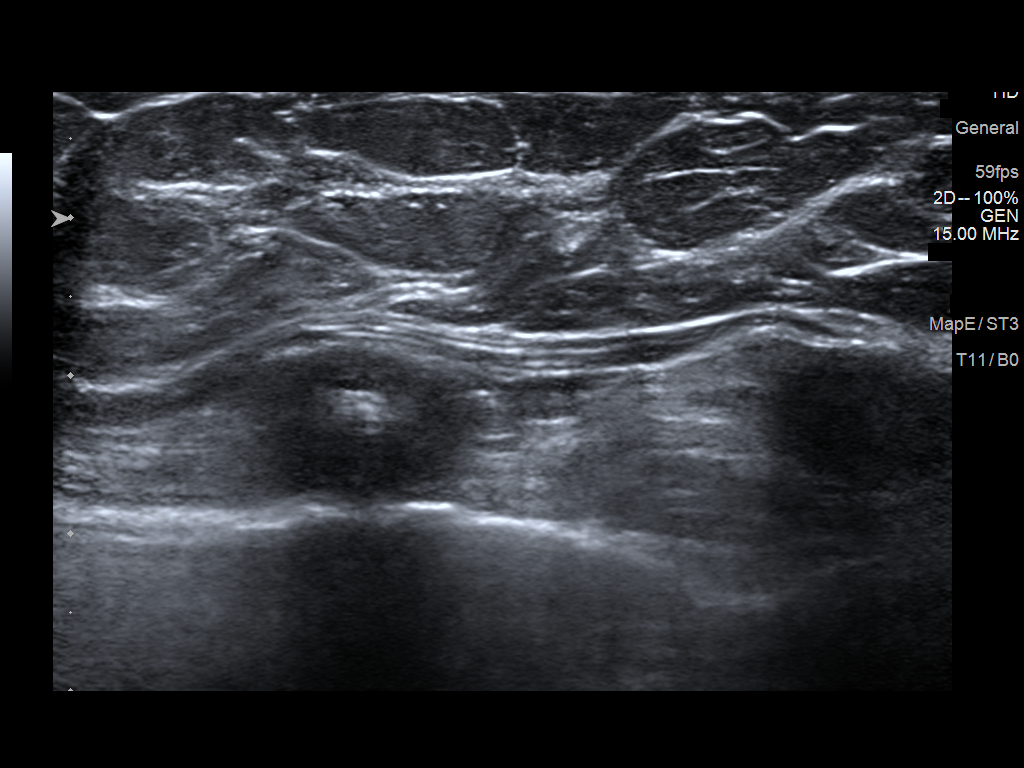
[im 3/4]
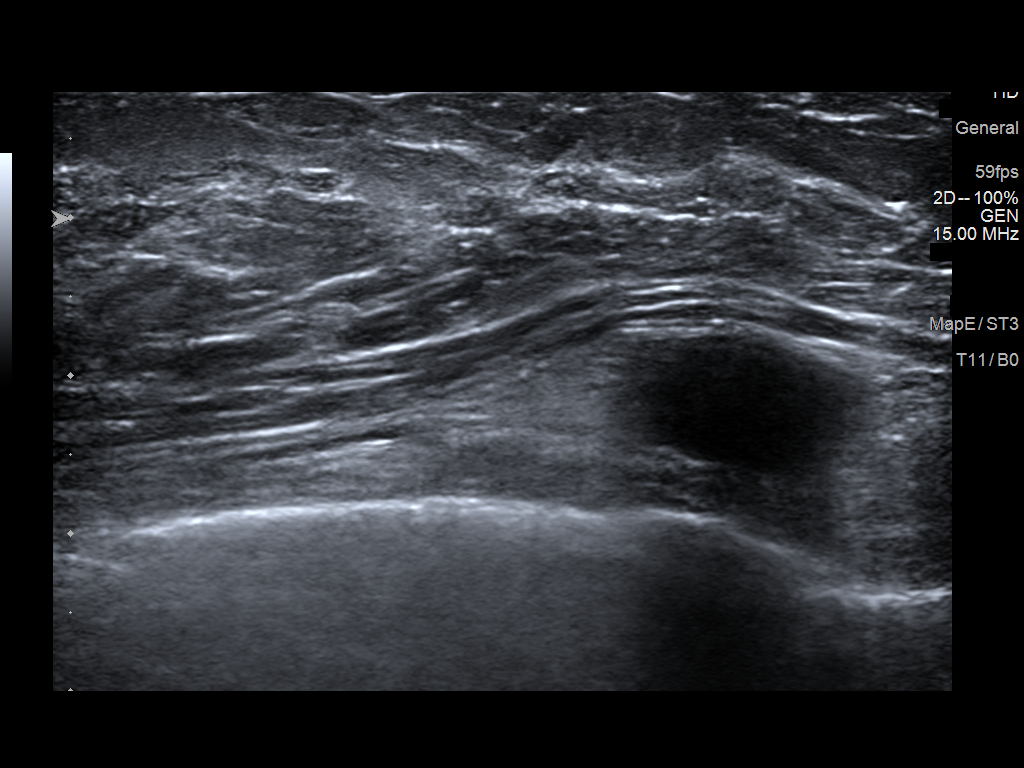
[im 4/4]
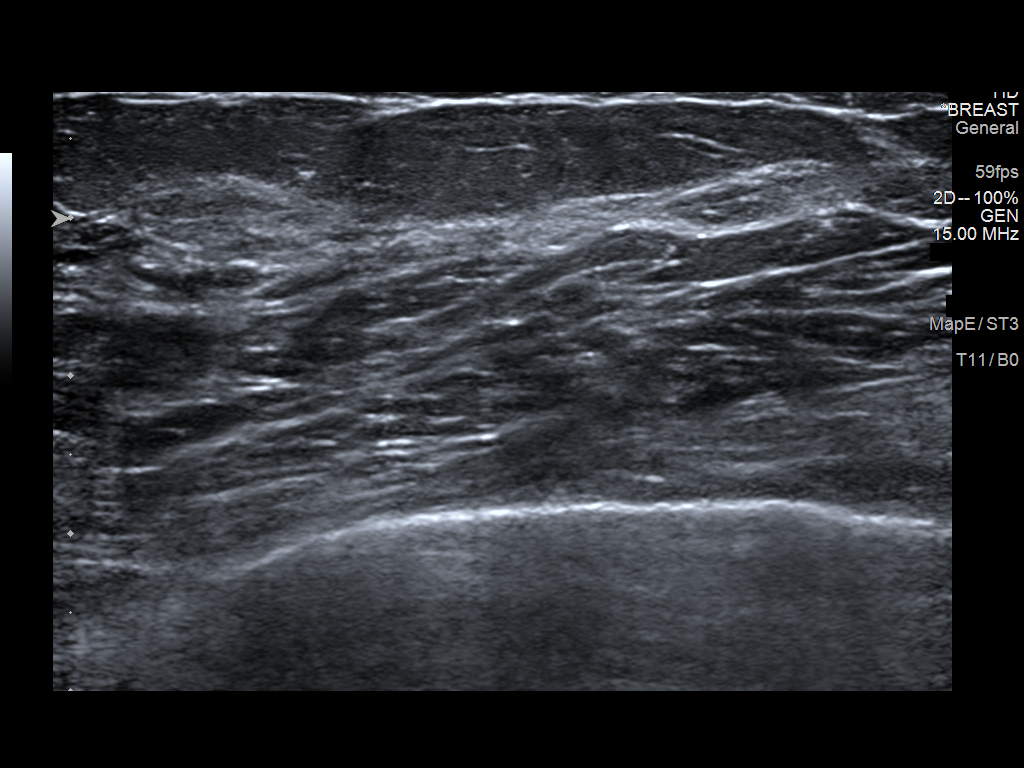

[4 of 4 positions shown; findings below may reference images not displayed]

ACR Breast Density Category d: The breast tissue is extremely dense,
which lowers the sensitivity of mammography.
FINDINGS: On today's additional diagnostic views, including spot compression
with 3D tomosynthesis, the questioned asymmetry within the inner
RIGHT breast is resolved suggesting superimposition of normal dense
fibroglandular tissues. Given the heterogeneously dense breast
tissues, ultrasound will be performed of this area to ensure
benignity.

Targeted ultrasound is performed, evaluating the inner RIGHT breast,
showing only normal fibroglandular tissues and fat lobules
throughout. No solid or cystic mass.
IMPRESSION: No evidence of malignancy within the RIGHT breast.

RECOMMENDATION:
1.  Screening mammogram in one year.(Code:1E-0-8VG)
2. At the time of today's exam, patient complained of a persistent
focal pain within the LEFT breast. An order for a LEFT breast
diagnostic mammogram and/or ultrasound could not be obtained today
from the ordering physician's office. Recommend clinical follow-up
and would consider a LEFT breast diagnostic mammogram and possible
ultrasound if the focal pain persisted.
3. Breast pain is a common condition, which will often resolve on
its own without intervention. Benign causes of breast pain were
discussed with the patient. Patient was encouraged to follow-up with
referring physician if the pain persisted or worsened as this might
indicate a need to evaluate the deeper structures of the underlying
chest wall. Patient was instructed to return for additional imaging
if a palpable lump developed.

I have discussed the findings and recommendations with the patient.
If applicable, a reminder letter will be sent to the patient
regarding the next appointment.

BI-RADS CATEGORY  1: Negative.
